# Patient Record
Sex: Female | Born: 1990 | Race: Black or African American | Hispanic: No | Smoking: Never smoker
Health system: Southern US, Community
[De-identification: ages and names within clinical notes are randomized; demographics above are authoritative.]

## PROBLEM LIST (undated history)

## (undated) ENCOUNTER — Inpatient Hospital Stay (HOSPITAL_COMMUNITY): Payer: Self-pay

## (undated) DIAGNOSIS — G43909 Migraine, unspecified, not intractable, without status migrainosus: Secondary | ICD-10-CM

---
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
## 2009-04-12 ENCOUNTER — Other Ambulatory Visit: Admission: RE | Admit: 2009-04-12 | Discharge: 2009-04-12 | Payer: Self-pay | Admitting: Family Medicine

## 2009-09-11 ENCOUNTER — Ambulatory Visit: Payer: Self-pay | Admitting: Family

## 2009-09-11 ENCOUNTER — Inpatient Hospital Stay (HOSPITAL_COMMUNITY): Admission: AD | Admit: 2009-09-11 | Discharge: 2009-09-11 | Payer: Self-pay | Admitting: Obstetrics & Gynecology

## 2009-09-25 ENCOUNTER — Emergency Department (HOSPITAL_COMMUNITY): Admission: EM | Admit: 2009-09-25 | Discharge: 2009-09-25 | Payer: Self-pay | Admitting: Emergency Medicine

## 2010-04-13 ENCOUNTER — Inpatient Hospital Stay (HOSPITAL_COMMUNITY)
Admission: AD | Admit: 2010-04-13 | Discharge: 2010-04-14 | Disposition: A | Discharge status: Home / Self Care | Payer: Medicaid Other | Source: Ambulatory Visit | Attending: Obstetrics and Gynecology | Admitting: Obstetrics and Gynecology

## 2010-04-13 DIAGNOSIS — R109 Unspecified abdominal pain: Secondary | ICD-10-CM

## 2010-04-13 DIAGNOSIS — O9989 Other specified diseases and conditions complicating pregnancy, childbirth and the puerperium: Secondary | ICD-10-CM

## 2010-04-13 DIAGNOSIS — O99891 Other specified diseases and conditions complicating pregnancy: Secondary | ICD-10-CM | POA: Insufficient documentation

## 2010-04-14 LAB — URINE MICROSCOPIC-ADD ON

## 2010-04-14 LAB — URINALYSIS, ROUTINE W REFLEX MICROSCOPIC
Bilirubin Urine: NEGATIVE
Glucose, UA: NEGATIVE mg/dL
Ketones, ur: 15 mg/dL — AB
Nitrite: POSITIVE — AB
Nitrite: POSITIVE — AB
Protein, ur: NEGATIVE mg/dL
Specific Gravity, Urine: 1.015 (ref 1.005–1.030)
Specific Gravity, Urine: 1.025 (ref 1.005–1.030)
Urobilinogen, UA: 4 mg/dL — ABNORMAL HIGH (ref 0.0–1.0)
pH: 6.5 (ref 5.0–8.0)

## 2010-04-14 LAB — POCT PREGNANCY, URINE: Preg Test, Ur: POSITIVE

## 2010-05-03 ENCOUNTER — Inpatient Hospital Stay (HOSPITAL_COMMUNITY)
Admission: AD | Admit: 2010-05-03 | Discharge: 2010-05-06 | DRG: 775 | Disposition: A | Discharge status: Home / Self Care | Payer: Medicaid Other | Source: Ambulatory Visit | Attending: Obstetrics and Gynecology | Admitting: Obstetrics and Gynecology

## 2010-05-03 DIAGNOSIS — O4290 Premature rupture of membranes, unspecified as to length of time between rupture and onset of labor, unspecified weeks of gestation: Secondary | ICD-10-CM | POA: Diagnosis present

## 2010-05-03 DIAGNOSIS — Z22330 Carrier of Group B streptococcus: Secondary | ICD-10-CM

## 2010-05-03 DIAGNOSIS — O34219 Maternal care for unspecified type scar from previous cesarean delivery: Principal | ICD-10-CM | POA: Diagnosis present

## 2010-05-03 DIAGNOSIS — O99892 Other specified diseases and conditions complicating childbirth: Secondary | ICD-10-CM | POA: Diagnosis present

## 2010-05-03 LAB — CBC
HCT: 29.1 % — ABNORMAL LOW (ref 36.0–46.0)
Hemoglobin: 10 g/dL — ABNORMAL LOW (ref 12.0–15.0)
MCHC: 34.4 g/dL (ref 30.0–36.0)
RDW: 14.6 % (ref 11.5–15.5)
WBC: 6.6 10*3/uL (ref 4.0–10.5)

## 2010-05-03 LAB — RPR: RPR Ser Ql: NONREACTIVE

## 2010-05-05 LAB — CBC
HCT: 22.3 % — ABNORMAL LOW (ref 36.0–46.0)
Platelets: 140 10*3/uL — ABNORMAL LOW (ref 150–400)
RBC: 2.52 MIL/uL — ABNORMAL LOW (ref 3.87–5.11)
RDW: 15.2 % (ref 11.5–15.5)
WBC: 8 10*3/uL (ref 4.0–10.5)

## 2010-05-06 LAB — URINE MICROSCOPIC-ADD ON

## 2010-05-06 LAB — URINALYSIS, ROUTINE W REFLEX MICROSCOPIC
Protein, ur: NEGATIVE mg/dL
Specific Gravity, Urine: 1.02 (ref 1.005–1.030)
Urobilinogen, UA: 1 mg/dL (ref 0.0–1.0)

## 2010-05-25 NOTE — Discharge Summary (Signed)
  NAMEVERENICE, WESTRICH         ACCOUNT NO.:  [id number]  MEDICAL RECORD NO.:  [id number]           PATIENT TYPE:  I  LOCATION:  9148                          FACILITY:  [facility]  PHYSICIAN:  Leighton Roach Dazha Kempa, M.D.DATE OF BIRTH:  Mar 27, 1990  DATE OF ADMISSION:  05/03/2010 DATE OF DISCHARGE:  05/06/2010                              DISCHARGE SUMMARY   ADMISSION DIAGNOSES:  Intrauterine pregnancy at 38 weeks, previous cesarean section, premature rupture of membranes, group B strep carrier.  DISCHARGE DIAGNOSIS:  Intrauterine pregnancy at 38 weeks, previous cesarean section, premature rupture of membranes, group B strep carrier.  PROCEDURES:  On April 5, she had a spontaneous vaginal delivery.  HISTORY AND PHYSICAL:  This is a 20 year old gravida 2, para 1-0-0-1 with an EGA of [redacted] weeks admitted by Dr. Ambrose Mantle with the complaint of leaking fluid.  Prenatal care complicated by UTI treated with Septra and the fact that she has a prior cesarean section is cleared for a trial labor and wishes to have a VBAC.  PRENATAL LABS:  Blood type is B+ with a negative antibody screen, rubella immune, first trimester screen normal.  MSAFP normal.  One hour Glucola 123, group B strep is positive, sensitive to erythromycin.  PAST OB HISTORY:  In 2008 low transverse cesarean section at term for prolonged second stage and failed vacuum, baby weight 6 pounds 6 ounces.  ALLERGIES:  PENICILLIN which causes hives.  PHYSICAL EXAMINATION:  VITAL SIGNS:  She is afebrile with stable vital signs. ABDOMEN:  Gravid, nontender.  Cervix is 3-4, 70-80, vertex -2.  Fetal heart tracing is normal.  HOSPITAL COURSE:  The patient was admitted by Dr. Ambrose Mantle and put on IV erythromycin and IV Pitocin.  She gradually progressed in active labor, reached complete, pushed well and early on the morning of April 5 had a vaginal delivery of a viable female infant with Apgar's of 9 and 9, weight 6 pounds 10 ounces.  Uterus  palpated normal.  She had bilateral labial laceration repaired with 3-0 Vicryl with local block and estimated blood loss was 400 mL.  Postpartum she had no significant complications.  Pre delivery hemoglobin was 10, postdelivery was 7.3. It was stable at 7.5 on postpartum day #2.  She had no clinical significant bleeding and was asymptomatic.  On postpartum day #2, she was felt to be stable enough for discharge home.  DISCHARGE INSTRUCTIONS:  Regular diet, pelvic rest, follow up in 6 weeks.  MEDICATIONS:  Over-the-counter ibuprofen as needed and over-the-counter iron b.i.d. and she is given our discharge pamphlet.     Zenaida Niece, M.D.     TDM/MEDQ  D:  05/06/2010  T:  05/06/2010  Job:  [id number]  Electronically Signed by Lavina Hamman M.D. on 05/25/2010 01:14:58 PM

## 2010-06-13 NOTE — Discharge Summary (Signed)
[name], FITZHENRY         ACCOUNT NO.:  [id number]   MEDICAL RECORD NO.:  [id number]          PATIENT TYPE:  INP   LOCATION:  9121                          FACILITY:  [facility]   PHYSICIAN:  Zenaida Niece, M.D.DATE OF BIRTH:  1990-08-28   DATE OF ADMISSION:  09/10/2006  DATE OF DISCHARGE:  09/14/2006                               DISCHARGE SUMMARY   ADMISSION DIAGNOSIS:  Intrauterine pregnancy at 39 weeks.   DISCHARGE DIAGNOSIS:  Intrauterine pregnancy at 39 weeks and prolonged  second stage of labor.   PROCEDURES:  On 08/13 Dr. Ambrose Mantle had a failed vacuum delivery and then  performed low-transverse cesarean section.   HISTORY AND PHYSICAL AND HOSPITAL COURSE:  Please see chart for full  history and physical as Dr. Ambrose Mantle dictated this. Briefly, this is a 20-  year-old black female gravida 1, para 0 with an EGA of [redacted] weeks by an  LMP consistent with an ultrasound who presents with complaint of rupture  of membranes.  Ruptured membranes were confirmed.  She was admitted to  labor and delivery and started on Pitocin.  She progressed to complete  and pushed and brought the baby down with a prolonged second stage.  Dr.  Ambrose Mantle took her to the operating room and attempted a vacuum delivery  but this was unsuccessful.  He then did a primary cesarean section under  epidural anesthesia with an estimated blood loss 1000 mL.  She delivered  a viable female infant with Apgars of 8 and 9, weight 6 pounds 6 ounces.  Postoperatively she has had no significant complications.  Pre delivery  hemoglobin 9.7, postdelivery 7.4.  On postoperative #3 she was felt to  be stable enough for discharge home.  At that time her incision is  healing well and her staples were removed and Steri-Strips applied.  Her  blood pressure on the morning of discharge is 140/90.  This is to be  rechecked prior to discharge and if it is elevated we will check [facility]  labs.      Zenaida Niece, M.D.  Electronically Signed     TDM/MEDQ  D:  09/14/2006  T:  09/15/2006  Job:  [id number]

## 2010-06-13 NOTE — Op Note (Signed)
NAMEZENORA, KARPEL         ACCOUNT NO.:  [id number]   MEDICAL RECORD NO.:  [id number]          PATIENT TYPE:  INP   LOCATION:  9121                          [facility]ACILITY:  [facility]   PHYSICIAN:  Malachi Pro. Ambrose Mantle, M.D. DATE OF BIRTH:  09/27/1990   DATE OF PROCEDURE:  09/11/2006  DATE OF DISCHARGE:                               OPERATIVE REPORT   PREOPERATIVE DIAGNOSES:  1. Intrauterine pregnancy at 39+ weeks.  2. Persistent occiput posterior.  3. Prolonged rupture of membranes.  4. Prolonged second stage of labor.   POSTOPERATIVE DIAGNOSES:  1. Intrauterine pregnancy at 39+ weeks.  2. Persistent occiput posterior.  3. Prolonged rupture of membranes.  4. Prolonged second stage of labor.  5. Failed vacuum delivery.  6. Fetal bradycardia that was marked.   OPERATION:  1. Attempted vacuum extraction.  2. Low transverse cervical cesarean section.   OPERATOR:  Malachi Pro. Ambrose Mantle, M.D.   Epidural anesthesia.   A female infant, 6 pounds 6 ounces, with Apgars of 8 and 9 at one and  five minutes.  Apgars assigned by Dr. Rennis Golden.   The patient was brought to the operating room and placed under  satisfactory epidural anesthesia, which had been boosted in the  patient's room.  When the patient got to the operating room and was  placed in stirrups, it was apparent that the epidural boost had caused  the vertex to lose station.  I attempted to apply the Kiwi vacuum after  sterilely draping and it is possible that I got a little bit of vaginal  soft tissue because I got a pop-off.  I reapplied the vacuum and it  popped off again.  There had been almost no descent, so I proceeded to  prepare her for C-section.  The abdomen was prepped with Betadine  solution.  The Foley catheter had been removed so I did not replace it.  The fetal heart rate had plummeted and did not show signs of recovery so  I wanted to proceed hastily to the C-section.  I draped the area as a  sterile field,  confirmed anesthesia, and then made a transverse incision  through the skin, subcutaneous tissue and fascia.  The fascia was  separated from the rectus muscles superiorly and inferiorly.  The rectus  muscle was split in the midline.  The peritoneum was opened vertically.  An incision was made into the lower uterine segment and went the rest of  the way into the amniotic sac with my finger and then pulled superiorly  and inferiorly on the incision to enlarge the incision.  The vertex was  posterior and lodged into the pelvis.  I asked the nurse to push up on  the vertex, which she did, and then I was able to deliver the baby's  head through the incisional opening.  The nose and pharynx were  suctioned with the bulb and it was apparent that the baby was making  respiratory effort.  The rest of the baby was delivered.  The cord was  clamped.  The infant was given to Dr. Rennis Golden, who was in  attendance.  He  assigned the  6 pound 6 ounce female infant Apgars of 8  at one and 9 at five minutes.  The placenta was removed intact.  The  inside of the uterus was inspected, found to be free of debris.  There  was a brisk bleeder from the left uterine incision.  The uterine  incision was closed in two layers using a running locked suture of 0  Vicryl on the first layer, a nonlocking suture of the same material on  the second layer.  The uterus, both tubes and ovaries appeared normal.  Liberal irrigation confirmed hemostasis.  A couple of extra sutures were  required for complete hemostasis.  The abdominal wall, having been  irrigated and cleansed of all debris, was then closed in layers using  interrupted sutures of 0 Vicryl in the rectus muscle, including the  peritoneum, two running sutures of 0 Vicryl on the fascia, a running 3-0  Vicryl on the subcu tissue, and staples on the skin.  The patient seemed  to tolerate the procedure well.  Blood loss was about 1000 mL.  Sponge  and needle counts  were correct, and she was returned to recovery in  satisfactory condition.      Malachi Pro. Ambrose Mantle, M.D.  Electronically Signed     TFH/MEDQ  D:  09/11/2006  T:  09/12/2006  Job:  [id number]

## 2010-06-13 NOTE — H&P (Signed)
NAMELAURENE, Victoria Solis         ACCOUNT NO.:  [id number]   MEDICAL RECORD NO.:  [id number]          PATIENT TYPE:  INP   LOCATION:  9121                          [facility]ACILITY:  [facility]   PHYSICIAN:  Malachi Pro. Ambrose Mantle, M.D. DATE OF BIRTH:  10-18-1990   DATE OF ADMISSION:  09/10/2006  DATE OF DISCHARGE:                              HISTORY & PHYSICAL   This is a 20 year old black female para 0, gravida 1, EDC September 15, 2006, by last period compatible with a 10-week ultrasound, who presented  to the office on September 10, 2006, with spontaneous rupture of membranes  at approximately 11 a.m.  She had no significant contractions.  She was  sent to maternity admission for admission and she had to wait for a bed.  Prenatal care was uneventful except for teen pregnancy, but she does  have support.  Blood group and type B+, negative antibody.  Rubella  immune.  RPR nonreactive.  Hepatitis B surface antigen negative.  Sickle  cell negative.  HIV negative.  GC negative.  Chlamydia was positive, was  negative on repeat after treatment.  One-hour Glucola 101.   PAST OBSTETRICAL HISTORY:  Negative.   No GYN history, medical history or surgical history.   She was allergic to PENICILLIN, caused hives.   MEDICATIONS:  Prenatal vitamins.   On admission she was afebrile with normal vital signs.  HEART/LUNGS:  Normal.  ABDOMEN:  Soft and nontender.  Cervix was 2 cm, 50%, vertex at a -2 on September 10, 2006, at 5:45 p.m.   The patient finally arrived in labor and delivery at approximately 9:30  p.m.  Fetal heart tones were reactive.  She was not having contractions.  She was begun on Pitocin.  By 6:30 a.m. on September 11, 2006, she was  comfortable with an epidural.  Her cervix was felt to be 4 cm, 90%, by  Dr. Senaida Ores.  No evidence of preeclampsia.  She was given an  intrauterine pressure catheter by Dr. Senaida Ores and her [place]  units were adequate.  At 7:55 a.m. I felt the cervix was no more than  4  cm, 90%, vertex at a -2.  By 11:45 a.m. the Pitocin was at 8 milliunits  a minute, cervix was 9 cm, 100%, vertex at a 0 to +1 station.  The  patient became fully dilated at 12:20 p.m.  By 1:13 p.m. she had pushed  for 53 minutes and at that point the vertex was still high, leading part  of the caput was at least 1 inch inside the introitus.  Pitocin was at 8  milliunits a minute.  At 3:20 p.m. the patient had made slight progress.  The vertex was OP.  I could see a little bit of the caput but the vertex  was well above the introitus.  I elected to take the patient to the  operating room for attempted vaginal delivery with a vacuum and if that  was unsuccessful, then proceed with C-section.   IMPRESSION:  1. Intrauterine pregnancy at 39+ weeks.  2. Persistent occiput posterior.  3. Prolonged rupture of membranes.  4. Prolonged second stage  of labor.  5. Intermittent fetal bradycardia.   The patient is prepared to go to the operating room.      Malachi Pro. Ambrose Mantle, M.D.  Electronically Signed     TFH/MEDQ  D:  09/11/2006  T:  09/12/2006  Job:  [id number]

## 2010-10-25 LAB — CBC
HCT: 34.5 — ABNORMAL LOW
Hemoglobin: 11.7 — ABNORMAL LOW
MCV: 88.3
RBC: 3.91
WBC: 5.4

## 2010-11-09 LAB — URINALYSIS, ROUTINE W REFLEX MICROSCOPIC
Glucose, UA: NEGATIVE
Protein, ur: NEGATIVE
Specific Gravity, Urine: 1.013
pH: 6.5

## 2010-11-09 LAB — URINE MICROSCOPIC-ADD ON

## 2010-11-13 LAB — COMPREHENSIVE METABOLIC PANEL
AST: 28
Albumin: 2.7 — ABNORMAL LOW
BUN: 8
Creatinine, Ser: 0.57
Total Protein: 5.4 — ABNORMAL LOW

## 2010-11-13 LAB — CBC
HCT: 22.2 — ABNORMAL LOW
Hemoglobin: 7.4 — CL
Hemoglobin: 9.7 — ABNORMAL LOW
MCHC: 33.9
MCV: 91.3
RBC: 2.43 — ABNORMAL LOW
RDW: 14.3 — ABNORMAL HIGH
WBC: 9.2

## 2010-11-13 LAB — URINALYSIS, DIPSTICK ONLY
Glucose, UA: NEGATIVE
Ketones, ur: NEGATIVE
Leukocytes, UA: NEGATIVE
Nitrite: NEGATIVE
Protein, ur: NEGATIVE
pH: 7

## 2010-11-13 LAB — URIC ACID: Uric Acid, Serum: 4.5

## 2012-04-14 ENCOUNTER — Emergency Department (HOSPITAL_COMMUNITY)
Admission: EM | Admit: 2012-04-14 | Discharge: 2012-04-14 | Disposition: A | Discharge status: Home / Self Care | Payer: Medicaid Other | Attending: Emergency Medicine | Admitting: Emergency Medicine

## 2012-04-14 ENCOUNTER — Encounter (HOSPITAL_COMMUNITY): Payer: Self-pay | Admitting: *Deleted

## 2012-04-14 DIAGNOSIS — K5289 Other specified noninfective gastroenteritis and colitis: Secondary | ICD-10-CM | POA: Insufficient documentation

## 2012-04-14 DIAGNOSIS — Z3202 Encounter for pregnancy test, result negative: Secondary | ICD-10-CM | POA: Insufficient documentation

## 2012-04-14 DIAGNOSIS — R197 Diarrhea, unspecified: Secondary | ICD-10-CM | POA: Insufficient documentation

## 2012-04-14 DIAGNOSIS — Z8679 Personal history of other diseases of the circulatory system: Secondary | ICD-10-CM | POA: Insufficient documentation

## 2012-04-14 DIAGNOSIS — R51 Headache: Secondary | ICD-10-CM | POA: Insufficient documentation

## 2012-04-14 DIAGNOSIS — R109 Unspecified abdominal pain: Secondary | ICD-10-CM | POA: Insufficient documentation

## 2012-04-14 DIAGNOSIS — K529 Noninfective gastroenteritis and colitis, unspecified: Secondary | ICD-10-CM

## 2012-04-14 HISTORY — DX: Migraine, unspecified, not intractable, without status migrainosus: G43.909

## 2012-04-14 LAB — URINALYSIS, ROUTINE W REFLEX MICROSCOPIC
Bilirubin Urine: NEGATIVE
Glucose, UA: NEGATIVE mg/dL
Hgb urine dipstick: NEGATIVE
Specific Gravity, Urine: 1.023 (ref 1.005–1.030)
Urobilinogen, UA: 0.2 mg/dL (ref 0.0–1.0)

## 2012-04-14 LAB — CBC WITH DIFFERENTIAL/PLATELET
Basophils Relative: 0 % (ref 0–1)
Eosinophils Absolute: 0 10*3/uL (ref 0.0–0.7)
Eosinophils Relative: 0 % (ref 0–5)
HCT: 38.6 % (ref 36.0–46.0)
Hemoglobin: 13.8 g/dL (ref 12.0–15.0)
MCH: 31.2 pg (ref 26.0–34.0)
MCHC: 35.8 g/dL (ref 30.0–36.0)
MCV: 87.1 fL (ref 78.0–100.0)
Monocytes Absolute: 0.2 10*3/uL (ref 0.1–1.0)
Monocytes Relative: 3 % (ref 3–12)
Neutro Abs: 7 10*3/uL (ref 1.7–7.7)

## 2012-04-14 LAB — COMPREHENSIVE METABOLIC PANEL
ALT: 17 U/L (ref 0–35)
AST: 18 U/L (ref 0–37)
Alkaline Phosphatase: 32 U/L — ABNORMAL LOW (ref 39–117)
Chloride: 103 mEq/L (ref 96–112)
GFR calc Af Amer: >90 mL/min (ref >90)
Total Protein: 7.5 g/dL (ref 6.0–8.3)

## 2012-04-14 LAB — POCT PREGNANCY, URINE: Preg Test, Ur: NEGATIVE

## 2012-04-14 MED ORDER — SODIUM CHLORIDE 0.9 % IV BOLUS (SEPSIS)
1000.0000 mL | Freq: Once | INTRAVENOUS | Status: AC
  Administered 2012-04-14: 1000 mL via INTRAVENOUS

## 2012-04-14 NOTE — ED Notes (Signed)
Pt aware that we need urine specimen. Unable to go at this time 

## 2012-04-14 NOTE — ED Notes (Signed)
Pt states headache since 0500 with history of headaches.  Pt states started having diarrhea and vomiting and has dizziness with standing.

## 2012-04-14 NOTE — ED Provider Notes (Signed)
History     CSN: [id number]  Arrival date & time 04/14/12  1232   First MD Initiated Contact with Patient 04/14/12 1520      Chief Complaint  Patient presents with  . Emesis  . Diarrhea  . Headache    (Consider location/radiation/quality/duration/timing/severity/associated sxs/prior treatment) Patient is a 22 y.o. female presenting with vomiting, diarrhea, and headaches. The history is provided by the patient. No language interpreter was used.  Emesis Associated symptoms: abdominal pain, diarrhea and headaches   Diarrhea:    Quality:  Watery   Number of occurrences:  2   Severity:  Moderate   Duration:  12 hours   Timing:  Intermittent   Progression:  Partially resolved Diarrhea Associated symptoms: abdominal pain and headaches   Associated symptoms: no vomiting   Headache Associated symptoms: abdominal pain, diarrhea and nausea   Associated symptoms: no vomiting    Patient also reports nausea, but no emesis.  Cramp-like abdominal pain.  Sibling with vomiting and diarrhea at home.  History of migraine type headaches, frequently relieved with aleve.  Past Medical History  Diagnosis Date  . Migraine     History reviewed. No pertinent past surgical history.  No family history on file.  History  Substance Use Topics  . Smoking status: Never Smoker   . Smokeless tobacco: Not on file  . Alcohol Use: No    OB History   Grav Para Term Preterm Abortions TAB SAB Ect Mult Living                  Review of Systems  Gastrointestinal: Positive for nausea, abdominal pain and diarrhea. Negative for vomiting.  Neurological: Positive for headaches.  All other systems reviewed and are negative.    Allergies  Amoxicillin and Penicillins  Home Medications  No current outpatient prescriptions on file.  BP 110/54  Pulse 62  Temp(Src) 98 F (36.7 C) (Oral)  Resp 18  SpO2 100%  Physical Exam  Constitutional: She is oriented to person, place, and time. She appears  well-developed and well-nourished.  HENT:  Head: Normocephalic.  Eyes: Pupils are equal, round, and reactive to light.  Neck: Normal range of motion.  Cardiovascular: Normal rate and regular rhythm.   Pulmonary/Chest: Effort normal and breath sounds normal.  Abdominal: Soft. Bowel sounds are normal.  Musculoskeletal: She exhibits no edema and no tenderness.  Lymphadenopathy:    She has no cervical adenopathy.  Neurological: She is alert and oriented to person, place, and time.  Skin: Skin is warm and dry. No rash noted.  Psychiatric: She has a normal mood and affect. Her behavior is normal. Judgment and thought content normal.    ED Course  Procedures (including critical care time)  Labs Reviewed  CBC WITH DIFFERENTIAL - Abnormal; Notable for the following:    Neutrophils Relative 90 (*)    Lymphocytes Relative 7 (*)    Lymphs Abs 0.6 (*)    All other components within normal limits  COMPREHENSIVE METABOLIC PANEL - Abnormal; Notable for the following:    Alkaline Phosphatase 32 (*)    All other components within normal limits  LIPASE, BLOOD  URINALYSIS, ROUTINE W REFLEX MICROSCOPIC  POCT PREGNANCY, URINE   No results found.   No diagnosis found.  Patient feeling better after IV fluids.  Nausea and headache improved. No additional episodes of diarrhea.  Gastroenteritis with mild dehydration.  MDM          Jimmye Norman, NP 04/14/12 1949

## 2012-04-21 NOTE — ED Provider Notes (Signed)
Medical screening examination/treatment/procedure(s) were performed by non-physician practitioner and as supervising physician I was immediately available for consultation/collaboration.  Suhas Estis T Dafna Romo, MD 04/21/12 1141 

## 2012-05-24 ENCOUNTER — Emergency Department (HOSPITAL_COMMUNITY)
Admission: EM | Admit: 2012-05-24 | Discharge: 2012-05-24 | Disposition: A | Discharge status: Home / Self Care | Payer: Medicaid Other | Attending: Emergency Medicine | Admitting: Emergency Medicine

## 2012-05-24 ENCOUNTER — Encounter (HOSPITAL_COMMUNITY): Payer: Self-pay | Admitting: *Deleted

## 2012-05-24 ENCOUNTER — Emergency Department (HOSPITAL_COMMUNITY): Payer: Medicaid Other

## 2012-05-24 DIAGNOSIS — W268XXA Contact with other sharp object(s), not elsewhere classified, initial encounter: Secondary | ICD-10-CM | POA: Insufficient documentation

## 2012-05-24 DIAGNOSIS — Y9289 Other specified places as the place of occurrence of the external cause: Secondary | ICD-10-CM | POA: Insufficient documentation

## 2012-05-24 DIAGNOSIS — Y9389 Activity, other specified: Secondary | ICD-10-CM | POA: Insufficient documentation

## 2012-05-24 DIAGNOSIS — Z23 Encounter for immunization: Secondary | ICD-10-CM | POA: Insufficient documentation

## 2012-05-24 DIAGNOSIS — Z8679 Personal history of other diseases of the circulatory system: Secondary | ICD-10-CM | POA: Insufficient documentation

## 2012-05-24 DIAGNOSIS — S91331A Puncture wound without foreign body, right foot, initial encounter: Secondary | ICD-10-CM

## 2012-05-24 DIAGNOSIS — Z880 Allergy status to penicillin: Secondary | ICD-10-CM | POA: Insufficient documentation

## 2012-05-24 DIAGNOSIS — S91309A Unspecified open wound, unspecified foot, initial encounter: Secondary | ICD-10-CM | POA: Insufficient documentation

## 2012-05-24 MED ORDER — TETANUS-DIPHTH-ACELL PERTUSSIS 5-2.5-18.5 LF-MCG/0.5 IM SUSP
0.5000 mL | Freq: Once | INTRAMUSCULAR | Status: AC
  Administered 2012-05-24: 0.5 mL via INTRAMUSCULAR
  Filled 2012-05-24: qty 0.5

## 2012-05-24 MED ORDER — CLINDAMYCIN HCL 300 MG PO CAPS: 300.0000 mg | ORAL_CAPSULE | Freq: Four times a day (QID) | ORAL | Status: DC

## 2012-05-24 MED ORDER — HYDROCODONE-ACETAMINOPHEN 5-325 MG PO TABS
1.0000 | ORAL_TABLET | Freq: Once | ORAL | Status: AC
  Administered 2012-05-24: 1 via ORAL
  Filled 2012-05-24: qty 1

## 2012-05-24 MED ORDER — HYDROCODONE-ACETAMINOPHEN 5-325 MG PO TABS: 1.0000 | ORAL_TABLET | Freq: Four times a day (QID) | ORAL | Status: DC | PRN

## 2012-05-24 NOTE — ED Provider Notes (Signed)
History     CSN: [id number]  Arrival date & time 05/24/12  4098   First MD Initiated Contact with Patient 05/24/12 2014      Chief Complaint  Patient presents with  . Puncture Wound   HPI  History provided by the patient. Patient is a 22 year old female with no significant PMH who presents with complaints of puncture wound after stepping on a nail to the right foot. Injury happened prior to arrival. Patient was wearing open sandals and taking out the trash when she stepped on a small rusty nail. Patient believes the nail remained intact but she did report point L. small pieces of dirt around the area. She complains of pain to the area and bottom of the foot. We'll symptoms are worse with pressure and walking. She has not used any medications or treatment for her symptoms. She denies any weakness or numbness in the toes. Denies any other associated symptoms. Patient is unsure of her last tetanus shot.   Past Medical History  Diagnosis Date  . Migraine     No past surgical history on file.  No family history on file.  History  Substance Use Topics  . Smoking status: Never Smoker   . Smokeless tobacco: Not on file  . Alcohol Use: No    OB History   Grav Para Term Preterm Abortions TAB SAB Ect Mult Living                  Review of Systems  Neurological: Negative for weakness and numbness.  All other systems reviewed and are negative.    Allergies  Amoxicillin and Penicillins  Home Medications   Current Outpatient Rx  Name  Route  Sig  Dispense  Refill  . MedroxyPROGESTERone Acetate (DEPO-PROVERA IM)   Intramuscular   Inject into the muscle every 3 (three) months.           BP 109/57  Pulse 62  Temp(Src) 98.2 F (36.8 C) (Oral)  Resp 16  SpO2 98%  Physical Exam  Nursing note and vitals reviewed. Constitutional: She is oriented to person, place, and time. She appears well-developed and well-nourished. No distress.  HENT:  Head: Normocephalic.   Cardiovascular: Normal rate and regular rhythm.   Pulmonary/Chest: Effort normal and breath sounds normal. No respiratory distress. She has no wheezes. She has no rales.  Musculoskeletal: Normal range of motion. She exhibits tenderness.  Small 2 mm puncture wound to the dorsal pad of the right foot in the area of the first and second metatarsals. Mild to moderate tenderness to palpation to this area. No active bleeding. No foreign bodies seen. Normal sensation to all toes. Normal range of motion. Normal cap refill less than 2 seconds.  Neurological: She is alert and oriented to person, place, and time.  Skin: Skin is warm and dry. No rash noted.  Psychiatric: She has a normal mood and affect. Her behavior is normal.    ED Course  Procedures      Dg Foot Complete Right  05/24/2012  *RADIOLOGY REPORT*  Clinical Data: Right foot pain after stepped on nail.  Puncture wound between the first and second metatarsal region.  RIGHT FOOT COMPLETE - 3+ VIEW  Comparison: None.  Findings: No radiopaque soft tissue foreign bodies or gas collections are identified.  Bones appear intact.  No evidence of acute fracture or subluxation.  No focal bone lesion or bone destruction.  No abnormal periosteal reaction.  IMPRESSION: No radiopaque soft tissue foreign bodies.  No acute bony abnormalities.   Original Report Authenticated By: Burman Nieves, M.D.      1. Puncture wound to foot, right, initial encounter       MDM  8:25 PM patient seen and evaluated. Patient resting comfortably appears well no acute distress.  Tetanus given.      Angus Seller, PA-C 05/24/12 2215

## 2012-05-24 NOTE — ED Notes (Signed)
Walking to take trash out, step on nail - right foot. Took nail out. Site clean, dry intact. Old blood (scant aroun site)

## 2012-05-25 NOTE — ED Provider Notes (Signed)
Medical screening examination/treatment/procedure(s) were performed by non-physician practitioner and as supervising physician I was immediately available for consultation/collaboration.  Gilda Crease, MD 05/25/12 [phone]

## 2013-01-02 ENCOUNTER — Emergency Department (INDEPENDENT_AMBULATORY_CARE_PROVIDER_SITE_OTHER)
Admission: EM | Admit: 2013-01-02 | Discharge: 2013-01-02 | Disposition: A | Discharge status: Home / Self Care | Payer: Medicaid Other | Source: Home / Self Care | Attending: Family Medicine | Admitting: Family Medicine

## 2013-01-02 ENCOUNTER — Encounter (HOSPITAL_COMMUNITY): Payer: Self-pay | Admitting: Emergency Medicine

## 2013-01-02 DIAGNOSIS — H6123 Impacted cerumen, bilateral: Secondary | ICD-10-CM

## 2013-01-02 DIAGNOSIS — H6120 Impacted cerumen, unspecified ear: Secondary | ICD-10-CM

## 2013-01-02 NOTE — ED Provider Notes (Signed)
CSN: [id number]     Arrival date & time 01/02/13  1416 History   First MD Initiated Contact with Patient 01/02/13 1459     Chief Complaint  Patient presents with  . Otalgia   (Consider location/radiation/quality/duration/timing/severity/associated sxs/prior Treatment) Patient is a 22 y.o. female presenting with ear pain. The history is provided by the patient.  Otalgia Location:  Bilateral Behind ear:  No abnormality Quality:  Dull Severity:  Mild Onset quality:  Gradual Duration:  1 week Progression:  Unchanged Chronicity:  New Context comment:  Using q-tips Relieved by:  None tried Worsened by:  Nothing tried Ineffective treatments:  None tried Associated symptoms: no ear discharge, no fever, no rhinorrhea and no tinnitus     Past Medical History  Diagnosis Date  . Migraine    History reviewed. No pertinent past surgical history. No family history on file. History  Substance Use Topics  . Smoking status: Never Smoker   . Smokeless tobacco: Not on file  . Alcohol Use: No   OB History   Grav Para Term Preterm Abortions TAB SAB Ect Mult Living                 Review of Systems  Constitutional: Negative.  Negative for fever.  HENT: Positive for ear pain. Negative for ear discharge, facial swelling, rhinorrhea and tinnitus.     Allergies  Amoxicillin and Penicillins  Home Medications   Current Outpatient Rx  Name  Route  Sig  Dispense  Refill  . clindamycin (CLEOCIN) 300 MG capsule   Oral   Take 1 capsule (300 mg total) by mouth 4 (four) times daily. X 7 days   28 capsule   0   . HYDROcodone-acetaminophen (NORCO) 5-325 MG per tablet   Oral   Take 1 tablet by mouth every 6 (six) hours as needed for pain.   20 tablet   0   . MedroxyPROGESTERone Acetate (DEPO-PROVERA IM)   Intramuscular   Inject into the muscle every 3 (three) months.          BP 106/67  Pulse 62  Temp(Src) 99.2 F (37.3 C) (Oral)  Resp 16  SpO2 100% Physical Exam  Nursing  note and vitals reviewed. Constitutional: She appears well-developed and well-nourished.  HENT:  Head: Normocephalic.  Ears:  Mouth/Throat: Oropharynx is clear and moist.    ED Course  Procedures (including critical care time) Labs Review Labs Reviewed - No data to display Imaging Review No results found.  EKG Interpretation    Date/Time:    Ventricular Rate:    PR Interval:    QRS Duration:   QT Interval:    QTC Calculation:   R Axis:     Text Interpretation:              MDM  Sx resolved with ear irrigation, canals and tm wnl.    Linna Hoff, MD 01/02/13 [phone]

## 2013-01-02 NOTE — ED Notes (Signed)
Right ear pain for one week.  Hearing muffled x 1 1/2 wk.

## 2013-08-04 ENCOUNTER — Encounter (HOSPITAL_COMMUNITY): Payer: Self-pay | Admitting: Emergency Medicine

## 2013-08-04 ENCOUNTER — Emergency Department (HOSPITAL_COMMUNITY)
Admission: EM | Admit: 2013-08-04 | Discharge: 2013-08-04 | Discharge status: Against Medical Advice | Payer: Medicaid Other | Attending: Emergency Medicine | Admitting: Emergency Medicine

## 2013-08-04 DIAGNOSIS — R109 Unspecified abdominal pain: Secondary | ICD-10-CM | POA: Insufficient documentation

## 2013-08-04 LAB — COMPREHENSIVE METABOLIC PANEL
ALBUMIN: 4.4 g/dL (ref 3.5–5.2)
ALK PHOS: 32 U/L — AB (ref 39–117)
ALT: 14 U/L (ref 0–35)
ANION GAP: 13 (ref 5–15)
AST: 14 U/L (ref 0–37)
BUN: 13 mg/dL (ref 6–23)
CO2: 24 mEq/L (ref 19–32)
Calcium: 9.2 mg/dL (ref 8.4–10.5)
Chloride: 105 mEq/L (ref 96–112)
Creatinine, Ser: 0.88 mg/dL (ref 0.50–1.10)
GFR calc Af Amer: >90 mL/min (ref >90)
GFR calc non Af Amer: >90 mL/min (ref >90)
Glucose, Bld: 107 mg/dL — ABNORMAL HIGH (ref 70–99)
POTASSIUM: 3.8 meq/L (ref 3.7–5.3)
SODIUM: 142 meq/L (ref 137–147)
TOTAL PROTEIN: 7.2 g/dL (ref 6.0–8.3)
Total Bilirubin: 0.7 mg/dL (ref 0.3–1.2)

## 2013-08-04 LAB — CBC WITH DIFFERENTIAL/PLATELET
BASOS ABS: 0 10*3/uL (ref 0.0–0.1)
BASOS PCT: 1 % (ref 0–1)
EOS ABS: 0 10*3/uL (ref 0.0–0.7)
Eosinophils Relative: 1 % (ref 0–5)
HCT: 38.2 % (ref 36.0–46.0)
Hemoglobin: 13.2 g/dL (ref 12.0–15.0)
Lymphocytes Relative: 32 % (ref 12–46)
Lymphs Abs: 1.8 10*3/uL (ref 0.7–4.0)
MCH: 31 pg (ref 26.0–34.0)
MCHC: 34.6 g/dL (ref 30.0–36.0)
MCV: 89.7 fL (ref 78.0–100.0)
Monocytes Absolute: 0.3 10*3/uL (ref 0.1–1.0)
Monocytes Relative: 5 % (ref 3–12)
NEUTROS PCT: 61 % (ref 43–77)
Neutro Abs: 3.4 10*3/uL (ref 1.7–7.7)
PLATELETS: 185 10*3/uL (ref 150–400)
RBC: 4.26 MIL/uL (ref 3.87–5.11)
RDW: 12.4 % (ref 11.5–15.5)
WBC: 5.6 10*3/uL (ref 4.0–10.5)

## 2013-08-04 LAB — LIPASE, BLOOD: Lipase: 41 U/L (ref 11–59)

## 2013-08-04 NOTE — ED Notes (Signed)
Pt presents to department for evaluation of diffuse abdominal pain, ongoing x2 weeks. 8/10 pain at the time. Denies urinary symptoms. Denies vaginal symptoms. Pt is alert and oriented x4.

## 2013-09-17 ENCOUNTER — Emergency Department (HOSPITAL_COMMUNITY)
Admission: EM | Admit: 2013-09-17 | Discharge: 2013-09-17 | Disposition: A | Discharge status: Home / Self Care | Payer: Self-pay | Source: Home / Self Care

## 2013-11-29 ENCOUNTER — Emergency Department (INDEPENDENT_AMBULATORY_CARE_PROVIDER_SITE_OTHER)
Admission: EM | Admit: 2013-11-29 | Discharge: 2013-11-29 | Disposition: A | Discharge status: Home / Self Care | Payer: Medicaid Other | Source: Home / Self Care | Attending: Emergency Medicine | Admitting: Emergency Medicine

## 2013-11-29 ENCOUNTER — Encounter (HOSPITAL_COMMUNITY): Payer: Self-pay | Admitting: Emergency Medicine

## 2013-11-29 DIAGNOSIS — B354 Tinea corporis: Secondary | ICD-10-CM

## 2013-11-29 MED ORDER — CLOTRIMAZOLE-BETAMETHASONE 1-0.05 % EX CREA: TOPICAL_CREAM | CUTANEOUS | Status: DC

## 2013-11-29 NOTE — ED Provider Notes (Signed)
CSN: [id number]     Arrival date & time 11/29/13  1250 History   First MD Initiated Contact with Patient 11/29/13 1301     Chief Complaint  Patient presents with  . Rash   (Consider location/radiation/quality/duration/timing/severity/associated sxs/prior Treatment) HPI Comments: Patient developed small area of pruritic rash at underwear line of left hip 2-3 days ago. Boyfriend with same. Applied bleach to area without improvement.  Reports herself to be otherwise healthy. PCP: none   Patient is a 23 y.o. female presenting with rash. The history is provided by the patient.  Rash   Past Medical History  Diagnosis Date  . Migraine    History reviewed. No pertinent past surgical history. History reviewed. No pertinent family history. History  Substance Use Topics  . Smoking status: Never Smoker   . Smokeless tobacco: Not on file  . Alcohol Use: No   OB History    No data available     Review of Systems  Skin: Positive for rash.  All other systems reviewed and are negative.   Allergies  Amoxicillin and Penicillins  Home Medications   Prior to Admission medications   Medication Sig Start Date End Date Taking? Authorizing Provider  clotrimazole-betamethasone (LOTRISONE) cream Apply to affected area 2 times daily x 7 days 11/29/13   Ria ClockJennifer Lee H Lamin Chandley, PA  MedroxyPROGESTERone Acetate (DEPO-PROVERA IM) Inject into the muscle every 3 (three) months.    Historical Provider, MD   BP 113/63 mmHg  Pulse 53  Temp(Src) 98 F (36.7 C) (Oral)  Resp 16  SpO2 100% Physical Exam  Constitutional: She is oriented to person, place, and time. She appears well-developed and well-nourished. No distress.  HENT:  Head: Normocephalic and atraumatic.  Cardiovascular: Normal rate, regular rhythm and normal heart sounds.   Pulmonary/Chest: Effort normal and breath sounds normal.  Musculoskeletal: Normal range of motion.  Neurological: She is alert and oriented to person, place, and  time.  Skin: Skin is warm and dry. Rash noted. There is erythema.     Psychiatric: She has a normal mood and affect. Her behavior is normal.  Nursing note and vitals reviewed.   ED Course  Procedures (including critical care time) Labs Review Labs Reviewed - No data to display  Imaging Review No results found.   MDM   1. Tinea corporis   Lotrisone as directed with follow up if no improvement.     Ria ClockJennifer Lee H Tymeer Vaquera, [place]PA 11/29/13 [phone]

## 2013-11-29 NOTE — Discharge Instructions (Signed)

## 2013-11-29 NOTE — ED Notes (Signed)
Pt states that she had a rash that started Tuesday 11/24/2013. Pt states that it was itching and irritated and pt placed clorox bleach on it. Rash is now red and scabbed. Pt in no acute distress at this time.

## 2014-07-03 ENCOUNTER — Emergency Department (INDEPENDENT_AMBULATORY_CARE_PROVIDER_SITE_OTHER)
Admission: EM | Admit: 2014-07-03 | Discharge: 2014-07-03 | Disposition: A | Discharge status: Home / Self Care | Payer: Self-pay | Source: Home / Self Care | Attending: Family Medicine | Admitting: Family Medicine

## 2014-07-03 ENCOUNTER — Encounter (HOSPITAL_COMMUNITY): Payer: Self-pay

## 2014-07-03 DIAGNOSIS — W57XXXA Bitten or stung by nonvenomous insect and other nonvenomous arthropods, initial encounter: Secondary | ICD-10-CM

## 2014-07-03 DIAGNOSIS — T148 Other injury of unspecified body region: Secondary | ICD-10-CM

## 2014-07-03 MED ORDER — TRIAMCINOLONE ACETONIDE 0.1 % EX CREA: 1.0000 "application " | TOPICAL_CREAM | Freq: Two times a day (BID) | CUTANEOUS | Status: DC

## 2014-07-03 NOTE — Discharge Instructions (Signed)
Bedbugs °Bedbugs are tiny bugs that live in and around beds. During the day, they hide in mattresses and other places near beds. They come out at night and bite people lying in bed. They need blood to live and grow. Bedbugs can be found in beds anywhere. Usually, they are found in places where many people come and go (hotels, shelters, hospitals). It does not matter whether the place is dirty or clean. °Getting bitten by bedbugs rarely causes a medical problem. The biggest problem can be getting rid of them.  This often takes the work of a pest control expert. °CAUSES °· Less use of pesticides. Bedbugs were common before the 1950s. Then, strong pesticides such as DDT nearly wiped them out. Today, these pesticides are not used because they harm the environment and can cause health problems. °· More travel. Besides mattresses, bedbugs can also live in clothing and luggage. They can come along as people travel from place to place. Bedbugs are more common in certain parts of the world. When people travel to those areas, the bugs can come home with them. °· Presence of birds and bats. Bedbugs often infest birds and bats. If you have these animals in or near your home, bedbugs may infest your house, too. °SYMPTOMS °It does not hurt to be bitten by a bedbug. You will probably not wake up when you are bitten. Bedbugs usually bite areas of the skin that are not covered. Symptoms may show when you wake up, or they may take a day or more to show up. Symptoms may include: °· Small red bumps on the skin. These might be lined up in a row or clustered in a group. °· A darker red dot in the middle of red bumps. °· Blisters on the skin. There may be swelling and very bad itching. These may be signs of an allergic reaction. This does not happen often. °DIAGNOSIS °Bedbug bites might look and feel like other types of insect bites. The bugs do not stay on the body like ticks or lice. They bite, drop off, and crawl away to hide. Your  caregiver will probably: °· Ask about your symptoms. °· Ask about your recent activities and travel. °· Check your skin for bedbug bites. °· Ask you to check at home for signs of bedbugs. You should look for: °¨ Spots or stains on the bed or nearby. This could be from bedbugs that were crushed or from their eggs or waste. °¨ Bedbugs themselves. They are reddish-brown, oval, and flat. They do not fly. They are about the size of an apple seed. °· Places to look for bedbugs include: °¨ Beds. Check mattresses, headboards, box springs, and bed frames. °¨ On drapes and curtains near the bed. °¨ Under carpeting in the bedroom. °¨ Behind electrical outlets. °¨ Behind any wallpaper that is peeling. °¨ Inside luggage. °TREATMENT °Most bedbug bites do not need treatment. They usually go away on their own in a few days. The bites are not dangerous. However, treatment may be needed if you have scratched so much that your skin has become infected. You may also need treatment if you are allergic to bedbug bites. Treatment options include: °· A drug that stops swelling and itching (corticosteroid). Usually, a cream is rubbed on the skin. If you have a bad rash, you may be given a corticosteroid pill. °· Oral antihistamines. These are pills to help control itching. °· Antibiotic medicines. An antibiotic may be prescribed for infected skin. °HOME CARE INSTRUCTIONS  °·   Take any medicine prescribed by your caregiver for your bites. Follow the directions carefully. °· Consider wearing pajamas with long sleeves and pant legs. °· Your bedroom may need to be treated. A pest control expert should make sure the bedbugs are gone. You may need to throw away mattresses or luggage. Ask the pest control expert what you can do to keep the bedbugs from coming back. Common suggestions include: °¨ Putting a plastic cover over your mattress. °¨ Washing and drying your clothes and bedding in hot water and a hot dryer. The temperature should be hotter  than 120° F (48.9° C). Bedbugs are killed by high temperatures. °¨ Vacuuming carefully all around your bed. Vacuum in all cracks and crevices where the bugs might hide. Do this often. °¨ Carefully checking all used furniture, bedding, or clothes that you bring into your house. °¨ Eliminating bird nests and bat roosts. °· If you get bedbug bites when traveling, check all your possessions carefully before bringing them into your house. If you find any bugs on clothes or in your luggage, consider throwing those items away. °SEEK MEDICAL CARE IF: °· You have red bug bites that keep coming back. °· You have red bug bites that itch badly. °· You have bug bites that cause a skin rash. °· You have scratch marks that are red and sore. °SEEK IMMEDIATE MEDICAL CARE IF: °You have a fever. °Document Released: 02/17/2010 Document Revised: 04/09/2011 Document Reviewed: 02/17/2010 °ExitCare® Patient Information ©2015 [employer]. This information is not intended to replace advice given to you by your health care provider. Make sure you discuss any questions you have with your health care provider. °Insect Bite °Mosquitoes, flies, fleas, bedbugs, and many other insects can bite. Insect bites are different from insect stings. A sting is when venom is injected into the skin. Some insect bites can transmit infectious diseases. °SYMPTOMS  °Insect bites usually turn red, swell, and itch for 2 to 4 days. They often go away on their own. °TREATMENT  °Your caregiver may prescribe antibiotic medicines if a bacterial infection develops in the bite. °HOME CARE INSTRUCTIONS °· Do not scratch the bite area. °· Keep the bite area clean and dry. Wash the bite area thoroughly with soap and water. °· Put ice or cool compresses on the bite area. °¨ Put ice in a plastic bag. °¨ Place a towel between your skin and the bag. °¨ Leave the ice on for 20 minutes, 4 times a day for the first 2 to 3 days, or as directed. °· You may apply a baking soda  paste, cortisone cream, or calamine lotion to the bite area as directed by your caregiver. This can help reduce itching and swelling. °· Only take over-the-counter or prescription medicines as directed by your caregiver. °· If you are given antibiotics, take them as directed. Finish them even if you start to feel better. °You may need a tetanus shot if: °· You cannot remember when you had your last tetanus shot. °· You have never had a tetanus shot. °· The injury broke your skin. °If you get a tetanus shot, your arm may swell, get red, and feel warm to the touch. This is common and not a problem. If you need a tetanus shot and you choose not to have one, there is a rare chance of getting tetanus. Sickness from tetanus can be serious. °SEEK IMMEDIATE MEDICAL CARE IF:  °· You have increased pain, redness, or swelling in the bite area. °· You   see a red line on the skin coming from the bite. °· You have a fever. °· You have joint pain. °· You have a headache or neck pain. °· You have unusual weakness. °· You have a rash. °· You have chest pain or shortness of breath. °· You have abdominal pain, nausea, or vomiting. °· You feel unusually tired or sleepy. °MAKE SURE YOU:  °· Understand these instructions. °· Will watch your condition. °· Will get help right away if you are not doing well or get worse. °Document Released: 02/23/2004 Document Revised: 04/09/2011 Document Reviewed: 08/16/2010 °ExitCare® Patient Information ©2015 [employer]. This information is not intended to replace advice given to you by your health care provider. Make sure you discuss any questions you have with your health care provider. ° °

## 2014-07-03 NOTE — ED Notes (Signed)
Red bumps all over body. Pt stayed at a friends house and was later told she had bed bugs. X 2 days

## 2014-07-03 NOTE — ED Provider Notes (Signed)
CSN: [id number]     Arrival date & time 07/03/14  1419 History   First MD Initiated Contact with Patient 07/03/14 1452     Chief Complaint  Patient presents with  . Rash   (Consider location/radiation/quality/duration/timing/severity/associated sxs/prior Treatment) HPI Comments: 24 year old female presents with an itchy rash for 2 days. She has been sleeping on the sofa. The rash consist of isolated raised red papules located to the feet and legs chest and arms. No pustules. No drainage or bleeding.   Past Medical History  Diagnosis Date  . Migraine    History reviewed. No pertinent past surgical history. No family history on file. History  Substance Use Topics  . Smoking status: Never Smoker   . Smokeless tobacco: Not on file  . Alcohol Use: No   OB History    No data available     Review of Systems  Constitutional: Negative.   Respiratory: Negative.   Skin: Positive for rash.  Neurological: Negative.   All other systems reviewed and are negative.   Allergies  Amoxicillin and Penicillins  Home Medications   Prior to Admission medications   Medication Sig Start Date End Date Taking? Authorizing Provider  clotrimazole-betamethasone (LOTRISONE) cream Apply to affected area 2 times daily x 7 days 11/29/13   Ria ClockJennifer Lee H Presson, PA  MedroxyPROGESTERone Acetate (DEPO-PROVERA IM) Inject into the muscle every 3 (three) months.    Historical Provider, MD  triamcinolone cream (KENALOG) 0.1 % Apply 1 application topically 2 (two) times daily. 07/03/14   Hayden Rasmussenavid Lowen Mansouri, NP   BP 117/71 mmHg  Pulse 65  Temp(Src) 97.6 F (36.4 C) (Oral)  Resp 16  SpO2 98% Physical Exam  Constitutional: She appears well-developed and well-nourished. No distress.  Neck: Normal range of motion. Neck supple.  Pulmonary/Chest: Effort normal. No respiratory distress.  Musculoskeletal: She exhibits no edema.  Skin: Skin is warm and dry. Rash noted.  Red itchy papules approximately 3-5 mm in diameter  located to the trunk and extremities. These appear consistent with bedbug bites.  Nursing note and vitals reviewed.   ED Course  Procedures (including critical care time) Labs Review Labs Reviewed - No data to display  Imaging Review No results found.   MDM   1. Insect bites    Triamcinolone cream Must rid sofa and other areas that may contain bedbugs or other insects. Read instructions.    Hayden Rasmussenavid Sarah Baez, NP 07/03/14 1505

## 2014-07-27 ENCOUNTER — Encounter (HOSPITAL_COMMUNITY): Payer: Self-pay | Admitting: *Deleted

## 2014-07-27 ENCOUNTER — Emergency Department (INDEPENDENT_AMBULATORY_CARE_PROVIDER_SITE_OTHER)
Admission: EM | Admit: 2014-07-27 | Discharge: 2014-07-27 | Disposition: A | Discharge status: Home / Self Care | Payer: Medicaid Other | Source: Home / Self Care | Attending: Family Medicine | Admitting: Family Medicine

## 2014-07-27 DIAGNOSIS — H918X3 Other specified hearing loss, bilateral: Secondary | ICD-10-CM

## 2014-07-27 DIAGNOSIS — H6123 Impacted cerumen, bilateral: Secondary | ICD-10-CM

## 2014-07-27 NOTE — ED Notes (Signed)
Pt  Reports  r   Earache   With  Sensation            Of  Decreased hearing             Since  Yesterday           -  Pt  Reports  She  Tried  To clean  Her  Ears   Out herself  Without  Much  Success

## 2014-07-27 NOTE — ED Provider Notes (Signed)
CSN: [id number]     Arrival date & time 07/27/14  1458 History   First MD Initiated Contact with Patient 07/27/14 1656     Chief Complaint  Patient presents with  . Otalgia   (Consider location/radiation/quality/duration/timing/severity/associated sxs/prior Treatment) Patient is a 24 y.o. female presenting with ear pain. The history is provided by the patient.  Otalgia Location:  Right Behind ear:  No abnormality Quality:  Dull and pressure Severity:  Mild Onset quality:  Sudden (3am while asleep, onset, worse with qtip usage.) Progression:  Unchanged Chronicity:  New Relieved by:  None tried Worsened by:  Nothing tried Ineffective treatments:  None tried Associated symptoms: hearing loss   Associated symptoms: no congestion, no ear discharge, no fever and no rhinorrhea     Past Medical History  Diagnosis Date  . Migraine    History reviewed. No pertinent past surgical history. History reviewed. No pertinent family history. History  Substance Use Topics  . Smoking status: Never Smoker   . Smokeless tobacco: Not on file  . Alcohol Use: No   OB History    No data available     Review of Systems  Constitutional: Negative.  Negative for fever.  HENT: Positive for ear pain and hearing loss. Negative for congestion, dental problem, ear discharge, facial swelling and rhinorrhea.   Eyes: Negative.   Respiratory: Negative.     Allergies  Amoxicillin and Penicillins  Home Medications   Prior to Admission medications   Medication Sig Start Date End Date Taking? Authorizing Provider  clotrimazole-betamethasone (LOTRISONE) cream Apply to affected area 2 times daily x 7 days 11/29/13   Ria ClockJennifer Lee H Presson, PA  MedroxyPROGESTERone Acetate (DEPO-PROVERA IM) Inject into the muscle every 3 (three) months.    Historical Provider, MD  triamcinolone cream (KENALOG) 0.1 % Apply 1 application topically 2 (two) times daily. 07/03/14   Hayden Rasmussenavid Mabe, NP   BP 96/50 mmHg  Pulse 64   Temp(Src) 98.4 F (36.9 C) (Oral)  Resp 12  SpO2 100% Physical Exam  Constitutional: She appears well-developed and well-nourished.  HENT:  Head: Normocephalic.  Right Ear: No drainage. Decreased hearing is noted.  Left Ear: No drainage. Decreased hearing is noted.  Ears:  Nose: Nose normal.  Mouth/Throat: Oropharynx is clear and moist.  Nursing note and vitals reviewed.   ED Course  Procedures (including critical care time) Labs Review Labs Reviewed - No data to display  Imaging Review No results found.   MDM   1. Hearing loss due to cerumen impaction, bilateral    Sx improved after irrig. Canals and tm nl.    Linna HoffJames D Alyanna Stoermer, MD 07/27/14 [phone]

## 2014-09-15 ENCOUNTER — Inpatient Hospital Stay (HOSPITAL_COMMUNITY)
Admission: AD | Admit: 2014-09-15 | Discharge: 2014-09-16 | Disposition: A | Discharge status: Home / Self Care | Payer: Medicaid Other | Source: Ambulatory Visit | Attending: Obstetrics and Gynecology | Admitting: Obstetrics and Gynecology

## 2014-09-15 ENCOUNTER — Encounter (HOSPITAL_COMMUNITY): Payer: Self-pay

## 2014-09-15 DIAGNOSIS — R102 Pelvic and perineal pain: Secondary | ICD-10-CM | POA: Diagnosis present

## 2014-09-15 DIAGNOSIS — Z880 Allergy status to penicillin: Secondary | ICD-10-CM | POA: Diagnosis not present

## 2014-09-15 DIAGNOSIS — Z8249 Family history of ischemic heart disease and other diseases of the circulatory system: Secondary | ICD-10-CM | POA: Insufficient documentation

## 2014-09-15 DIAGNOSIS — N949 Unspecified condition associated with female genital organs and menstrual cycle: Secondary | ICD-10-CM | POA: Diagnosis not present

## 2014-09-15 DIAGNOSIS — Z833 Family history of diabetes mellitus: Secondary | ICD-10-CM | POA: Insufficient documentation

## 2014-09-15 DIAGNOSIS — N943 Premenstrual tension syndrome: Secondary | ICD-10-CM

## 2014-09-15 LAB — URINALYSIS, ROUTINE W REFLEX MICROSCOPIC
BILIRUBIN URINE: NEGATIVE
GLUCOSE, UA: NEGATIVE mg/dL
HGB URINE DIPSTICK: NEGATIVE
Ketones, ur: NEGATIVE mg/dL
Leukocytes, UA: NEGATIVE
Nitrite: NEGATIVE
Protein, ur: NEGATIVE mg/dL
SPECIFIC GRAVITY, URINE: 1.025 (ref 1.005–1.030)
UROBILINOGEN UA: 0.2 mg/dL (ref 0.0–1.0)
pH: 6.5 (ref 5.0–8.0)

## 2014-09-15 LAB — POCT PREGNANCY, URINE: PREG TEST UR: NEGATIVE

## 2014-09-15 MED ORDER — KETOROLAC TROMETHAMINE 60 MG/2ML IM SOLN
60.0000 mg | Freq: Once | INTRAMUSCULAR | Status: AC
  Administered 2014-09-16: 60 mg via INTRAMUSCULAR
  Filled 2014-09-15: qty 2

## 2014-09-15 NOTE — MAU Provider Note (Signed)
History     CSN: [id number]  Arrival date and time: 09/15/14 2254   First Provider Initiated Contact with Patient 09/15/14 2353      No chief complaint on file.  HPI Comments: Victoria Solis is a 24 y.o. G2P0 who presents today with cramping. She states that she has had the cramping off and on for about two days. She has not had a period in an extended amount of time. She had been on depo-provera since 2012, and has not had an injection since January. [facility].   Pelvic Pain The patient's primary symptoms include pelvic pain. This is a new problem. The current episode started yesterday. The problem occurs intermittently. The problem has been unchanged. Pain severity now: 7/10  The problem affects both sides. She is not pregnant. Associated symptoms include abdominal pain. Pertinent negatives include no constipation, diarrhea, dysuria, fever, frequency, nausea, urgency or vomiting. Nothing aggravates the symptoms. Treatments tried: pamprin  The treatment provided no relief. She is sexually active. It is unknown whether or not her partner has an STD. She uses nothing for contraception.    Past Medical History  Diagnosis Date  . Migraine     Past Surgical History  Procedure Laterality Date  . Cesarean section      Family History  Problem Relation Age of Onset  . Hypertension Mother   . Diabetes Mother   . Hypertension Father     Social History  Substance Use Topics  . Smoking status: Never Smoker   . Smokeless tobacco: Never Used  . Alcohol Use: No    Allergies:  Allergies  Allergen Reactions  . Amoxicillin Hives  . Penicillins Hives    Prescriptions prior to admission  Medication Sig Dispense Refill Last Dose  . OVER THE COUNTER MEDICATION "Pamprin"   09/15/2014 at Unknown time  . OVER THE COUNTER MEDICATION Vitamin C   09/15/2014 at Unknown time  . clotrimazole-betamethasone (LOTRISONE) cream Apply to affected area 2  times daily x 7 days 15 g 0   . MedroxyPROGESTERone Acetate (DEPO-PROVERA IM) Inject into the muscle every 3 (three) months.   07/28/2013  . triamcinolone cream (KENALOG) 0.1 % Apply 1 application topically 2 (two) times daily. 30 g 0     Review of Systems  Constitutional: Negative for fever.  Gastrointestinal: Positive for abdominal pain. Negative for nausea, vomiting, diarrhea and constipation.  Genitourinary: Positive for pelvic pain. Negative for dysuria, urgency and frequency.   Physical Exam   Blood pressure 123/78, pulse 59, temperature 98.2 F (36.8 C), temperature source Oral, resp. rate 16, height 5\' 2"  (1.575 m), weight 60.782 kg (134 lb), SpO2 100 %.  Physical Exam  Nursing note and vitals reviewed. Constitutional: She is oriented to person, place, and time. She appears well-developed and well-nourished. No distress.  HENT:  Head: Normocephalic.  Cardiovascular: Normal rate.   Respiratory: Effort normal.  GI: Soft. There is no tenderness. There is no rebound.  Neurological: She is alert and oriented to person, place, and time.  Skin: Skin is warm and dry.  Psychiatric: She has a normal mood and affect.   Results for orders placed or performed during the hospital encounter of 09/15/14 (from the past 24 hour(s))  Urinalysis, Routine w reflex microscopic (not at [facility])     Status: None   Collection Time: 09/15/14 11:20 PM  Result Value Ref Range   Color, Urine YELLOW YELLOW   APPearance CLEAR CLEAR  Specific Gravity, Urine 1.025 1.005 - 1.030   pH 6.5 5.0 - 8.0   Glucose, UA NEGATIVE NEGATIVE mg/dL   Hgb urine dipstick NEGATIVE NEGATIVE   Bilirubin Urine NEGATIVE NEGATIVE   Ketones, ur NEGATIVE NEGATIVE mg/dL   Protein, ur NEGATIVE NEGATIVE mg/dL   Urobilinogen, UA 0.2 0.0 - 1.0 mg/dL   Nitrite NEGATIVE NEGATIVE   Leukocytes, UA NEGATIVE NEGATIVE  Pregnancy, urine POC     Status: None   Collection Time: 09/15/14 11:38 PM  Result Value Ref Range   Preg Test, Ur  NEGATIVE NEGATIVE  Wet prep, genital     Status: None   Collection Time: 09/16/14 12:30 AM  Result Value Ref Range   Yeast Wet Prep HPF POC NONE SEEN NONE SEEN   Trich, Wet Prep NONE SEEN NONE SEEN   Clue Cells Wet Prep HPF POC NONE SEEN NONE SEEN   WBC, Wet Prep HPF POC NONE SEEN NONE SEEN    MAU Course  Procedures  MDM   Assessment and Plan   1. Premenstrual symptom    DC home Comfort measures reviewed  RX: ibuprofen 800 mg #30  Return to MAU as needed FU with OB as planned  Follow-up Information    Follow up with [facility] [place].   Contact information:   [address] [address] [phone]        Tawnya Crook 09/15/2014, 11:54 PM

## 2014-09-15 NOTE — MAU Note (Signed)
Pt reports lower abd cramping off/on x 2 days. LMP unk., pt states she was on Depo x 4 years and stopped it in January.

## 2014-09-16 DIAGNOSIS — N949 Unspecified condition associated with female genital organs and menstrual cycle: Secondary | ICD-10-CM

## 2014-09-16 LAB — WET PREP, GENITAL
CLUE CELLS WET PREP: NONE SEEN
TRICH WET PREP: NONE SEEN
WBC, Wet Prep HPF POC: NONE SEEN
YEAST WET PREP: NONE SEEN

## 2014-09-16 LAB — HIV ANTIBODY (ROUTINE TESTING W REFLEX): HIV Screen 4th Generation wRfx: NONREACTIVE

## 2014-09-16 MED ORDER — IBUPROFEN 800 MG PO TABS: 800.0000 mg | ORAL_TABLET | Freq: Three times a day (TID) | ORAL | Status: DC | PRN

## 2014-09-16 NOTE — Discharge Instructions (Signed)

## 2014-09-17 LAB — GC/CHLAMYDIA PROBE AMP (~~LOC~~) NOT AT ARMC
Chlamydia: NEGATIVE
NEISSERIA GONORRHEA: NEGATIVE

## 2014-12-09 ENCOUNTER — Emergency Department (HOSPITAL_COMMUNITY): Payer: No Typology Code available for payment source

## 2014-12-09 ENCOUNTER — Emergency Department (HOSPITAL_COMMUNITY)
Admission: EM | Admit: 2014-12-09 | Discharge: 2014-12-09 | Disposition: A | Discharge status: Home / Self Care | Payer: No Typology Code available for payment source | Attending: Emergency Medicine | Admitting: Emergency Medicine

## 2014-12-09 ENCOUNTER — Encounter (HOSPITAL_COMMUNITY): Payer: Self-pay | Admitting: Emergency Medicine

## 2014-12-09 DIAGNOSIS — Y998 Other external cause status: Secondary | ICD-10-CM | POA: Diagnosis not present

## 2014-12-09 DIAGNOSIS — Z7952 Long term (current) use of systemic steroids: Secondary | ICD-10-CM | POA: Diagnosis not present

## 2014-12-09 DIAGNOSIS — Y92410 Unspecified street and highway as the place of occurrence of the external cause: Secondary | ICD-10-CM | POA: Insufficient documentation

## 2014-12-09 DIAGNOSIS — Z8669 Personal history of other diseases of the nervous system and sense organs: Secondary | ICD-10-CM | POA: Insufficient documentation

## 2014-12-09 DIAGNOSIS — S0990XA Unspecified injury of head, initial encounter: Secondary | ICD-10-CM | POA: Diagnosis present

## 2014-12-09 DIAGNOSIS — Z880 Allergy status to penicillin: Secondary | ICD-10-CM | POA: Insufficient documentation

## 2014-12-09 DIAGNOSIS — S3992XA Unspecified injury of lower back, initial encounter: Secondary | ICD-10-CM | POA: Diagnosis not present

## 2014-12-09 DIAGNOSIS — S199XXA Unspecified injury of neck, initial encounter: Secondary | ICD-10-CM | POA: Diagnosis not present

## 2014-12-09 DIAGNOSIS — S0003XA Contusion of scalp, initial encounter: Secondary | ICD-10-CM | POA: Insufficient documentation

## 2014-12-09 DIAGNOSIS — M542 Cervicalgia: Secondary | ICD-10-CM

## 2014-12-09 DIAGNOSIS — Y9389 Activity, other specified: Secondary | ICD-10-CM | POA: Insufficient documentation

## 2014-12-09 DIAGNOSIS — Z3202 Encounter for pregnancy test, result negative: Secondary | ICD-10-CM | POA: Insufficient documentation

## 2014-12-09 DIAGNOSIS — S299XXA Unspecified injury of thorax, initial encounter: Secondary | ICD-10-CM | POA: Diagnosis not present

## 2014-12-09 DIAGNOSIS — M546 Pain in thoracic spine: Secondary | ICD-10-CM

## 2014-12-09 LAB — POC URINE PREG, ED: PREG TEST UR: NEGATIVE
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
## 2014-12-09 NOTE — Discharge Instructions (Signed)
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
## 2014-12-16 ENCOUNTER — Encounter (HOSPITAL_COMMUNITY): Payer: Self-pay | Admitting: *Deleted

## 2014-12-16 ENCOUNTER — Emergency Department (INDEPENDENT_AMBULATORY_CARE_PROVIDER_SITE_OTHER)
Admission: EM | Admit: 2014-12-16 | Discharge: 2014-12-16 | Disposition: A | Discharge status: Home / Self Care | Payer: Medicaid Other | Source: Home / Self Care

## 2014-12-16 DIAGNOSIS — H109 Unspecified conjunctivitis: Secondary | ICD-10-CM | POA: Diagnosis not present

## 2014-12-16 LAB — POCT PREGNANCY, URINE: Preg Test, Ur: NEGATIVE

## 2014-12-16 MED ORDER — TOBRAMYCIN 0.3 % OP SOLN: 1.0000 [drp] | Freq: Four times a day (QID) | OPHTHALMIC | Status: DC

## 2014-12-16 MED ORDER — IPRATROPIUM BROMIDE 0.06 % NA SOLN: 2.0000 | Freq: Four times a day (QID) | NASAL | Status: DC

## 2014-12-16 NOTE — ED Notes (Signed)
Pt  Reports   Irritation    l  Eye   With   Redness       -     Symptoms  Started      Today  She   Has         Some  Redness  To  The   l  Eye   denys  Any  Injury                The  Eye  Is  Watery  With  Some  Redness  Present

## 2014-12-16 NOTE — ED Provider Notes (Signed)
CSN: [id number]     Arrival date & time 12/16/14  1522 History   None    Chief Complaint  Patient presents with  . Eye Problem   (Consider location/radiation/quality/duration/timing/severity/associated sxs/prior Treatment) Patient is a 24 y.o. female presenting with eye problem. The history is provided by the patient.  Eye Problem Location:  L eye Quality:  Tearing Severity:  Mild Onset quality:  Gradual Duration:  8 hours Progression:  Unchanged Chronicity:  New Context comment:  [employer] [facility] [employer] Relieved by:  None tried Worsened by:  Nothing tried Ineffective treatments:  None tried Associated symptoms: crusting, discharge, inflammation and redness   Associated symptoms: no photophobia     Past Medical History  Diagnosis Date  . Migraine    Past Surgical History  Procedure Laterality Date  . Cesarean section     Family History  Problem Relation Age of Onset  . Hypertension Mother   . Diabetes Mother   . Hypertension Father    Social History  Substance Use Topics  . Smoking status: Never Smoker   . Smokeless tobacco: Never Used  . Alcohol Use: No   OB History    Gravida Para Term Preterm AB TAB SAB Ectopic Multiple Living   2         2     Review of Systems  Constitutional: Negative.   HENT: Positive for congestion and postnasal drip.   Eyes: Positive for discharge and redness. Negative for photophobia.  All other systems reviewed and are negative.   Allergies  Amoxicillin and Penicillins  Home Medications   Prior to Admission medications   Medication Sig Start Date End Date Taking? Authorizing Provider  clotrimazole-betamethasone (LOTRISONE) cream Apply to affected area 2 times daily x 7 days 11/29/13   Ria Clock, PA  ibuprofen (ADVIL,MOTRIN) 800 MG tablet Take 1 tablet (800 mg total) by mouth every 8 (eight) hours as needed. 09/16/14   Armando Reichert, CNM  ipratropium (ATROVENT) 0.06 % nasal spray Place 2 sprays into both nostrils 4  (four) times daily. 12/16/14   Linna Hoff, MD  OVER THE COUNTER MEDICATION "Pamprin"    Historical Provider, MD  OVER THE COUNTER MEDICATION Vitamin C    Historical Provider, MD  tobramycin (TOBREX) 0.3 % ophthalmic solution Place 1 drop into the left eye every 6 (six) hours. 12/16/14   Linna Hoff, MD  triamcinolone cream (KENALOG) 0.1 % Apply 1 application topically 2 (two) times daily. 07/03/14   Hayden Rasmussen, NP   Meds Ordered and Administered this Visit  Medications - No data to display  BP 104/76 mmHg  Pulse 78  Temp(Src) 98.6 F (37 C) (Oral)  Resp 18  SpO2 100%  LMP  (Exact Date) No data found.   Physical Exam  Constitutional: She is oriented to person, place, and time. She appears well-developed and well-nourished. No distress.  HENT:  Right Ear: External ear normal.  Left Ear: External ear normal.  Mouth/Throat: Oropharynx is clear and moist.  Eyes: EOM are normal. Pupils are equal, round, and reactive to light. Right eye exhibits no discharge. Left eye exhibits discharge. Right conjunctiva is not injected. Left conjunctiva is injected. No scleral icterus.  Neck: Normal range of motion. Neck supple.  Lymphadenopathy:    She has no cervical adenopathy.  Neurological: She is alert and oriented to person, place, and time.  Skin: Skin is warm and dry.  Nursing note and vitals reviewed.   ED Course  Procedures (  including critical care time)  Labs Review Labs Reviewed  POCT PREGNANCY, URINE    Imaging Review No results found.   Visual Acuity Review  Right Eye Distance:   Left Eye Distance:   Bilateral Distance:    Right Eye Near:   Left Eye Near:    Bilateral Near:         MDM   1. Conjunctivitis of left eye      Linna HoffJames D Kindl, MD 12/16/14 1721

## 2015-03-12 ENCOUNTER — Emergency Department (HOSPITAL_COMMUNITY)
Admission: EM | Admit: 2015-03-12 | Discharge: 2015-03-12 | Disposition: A | Discharge status: Home / Self Care | Payer: Medicaid Other | Attending: Emergency Medicine | Admitting: Emergency Medicine

## 2015-03-12 ENCOUNTER — Encounter (HOSPITAL_COMMUNITY): Payer: Self-pay | Admitting: Family Medicine

## 2015-03-12 DIAGNOSIS — Z880 Allergy status to penicillin: Secondary | ICD-10-CM | POA: Diagnosis not present

## 2015-03-12 DIAGNOSIS — Z79899 Other long term (current) drug therapy: Secondary | ICD-10-CM | POA: Diagnosis not present

## 2015-03-12 DIAGNOSIS — Z8679 Personal history of other diseases of the circulatory system: Secondary | ICD-10-CM | POA: Diagnosis not present

## 2015-03-12 DIAGNOSIS — H6121 Impacted cerumen, right ear: Secondary | ICD-10-CM | POA: Diagnosis not present

## 2015-03-12 DIAGNOSIS — H9201 Otalgia, right ear: Secondary | ICD-10-CM

## 2015-03-12 DIAGNOSIS — Z7952 Long term (current) use of systemic steroids: Secondary | ICD-10-CM | POA: Insufficient documentation

## 2015-03-12 MED ORDER — DOCUSATE SODIUM 50 MG/5ML PO LIQD
50.0000 mg | Freq: Once | ORAL | Status: AC
  Administered 2015-03-12: 50 mg via OTIC
  Filled 2015-03-12: qty 10

## 2015-03-12 NOTE — ED Notes (Signed)
Pt here for right ear pain and sts hurts when she put a cu tip in there. Seen at [facility] for the same before.

## 2015-03-12 NOTE — ED Notes (Signed)
Declined W/C at D/C and was escorted to lobby by RN. 

## 2015-03-12 NOTE — Discharge Instructions (Signed)
1. Medications: usual home medications 2. Treatment: rest, drink plenty of fluids, keep ears clean, do not use Q-tips 3. Follow Up: Please followup with your primary doctor in 2-3 days for discussion of your diagnoses and further evaluation after today's visit; if you do not have a primary care doctor use the resource guide provided to find one; Please return to the ER for worsening symptoms   Cerumen Impaction The structures of the external ear canal secrete a waxy substance known as cerumen. Excess cerumen can build up in the ear canal, causing a condition known as cerumen impaction. Cerumen impaction can cause ear pain and disrupt the function of the ear. The rate of cerumen production differs for each individual. In certain individuals, the configuration of the ear canal may decrease his or her ability to naturally remove cerumen. CAUSES Cerumen impaction is caused by excessive cerumen production or buildup. RISK FACTORS  Frequent use of swabs to clean ears.  Having narrow ear canals.  Having eczema.  Being dehydrated. SIGNS AND SYMPTOMS  Diminished hearing.  Ear drainage.  Ear pain.  Ear itch. TREATMENT Treatment may involve:  Over-the-counter or prescription ear drops to soften the cerumen.  Removal of cerumen by a health care provider. This may be done with:  Irrigation with warm water. This is the most common method of removal.  Ear curettes and other instruments.  Surgery. This may be done in severe cases. HOME CARE INSTRUCTIONS  Take medicines only as directed by your health care provider.  Do not insert objects into the ear with the intent of cleaning the ear. PREVENTION  Do not insert objects into the ear, even with the intent of cleaning the ear. Removing cerumen as a part of normal hygiene is not necessary, and the use of swabs in the ear canal is not recommended.  Drink enough water to keep your urine clear or pale yellow.  Control your eczema if you  have it. SEEK MEDICAL CARE IF:  You develop ear pain.  You develop bleeding from the ear.  The cerumen does not clear after you use ear drops as directed.   This information is not intended to replace advice given to you by your health care provider. Make sure you discuss any questions you have with your health care provider.   Document Released: 02/23/2004 Document Revised: 02/05/2014 Document Reviewed: 09/01/2014 Elsevier Interactive Patient Education 2016 [employer].    Emergency Department Resource Guide 1) Find a Doctor and Pay Out of Pocket Although you won't have to find out who is covered by your insurance plan, it is a good idea to ask around and get recommendations. You will then need to call the office and see if the doctor you have chosen will accept you as a new patient and what types of options they offer for patients who are self-pay. Some doctors offer discounts or will set up payment plans for their patients who do not have insurance, but you will need to ask so you aren't surprised when you get to your appointment.  2) Contact Your Local Health Department Not all health departments have doctors that can see patients for sick visits, but many do, so it is worth a call to see if yours does. If you don't know where your local health department is, you can check in your phone book. The CDC also has a tool to help you locate your state's health department, and many state websites also have listings of all of their local  health departments.  3) Find a Walk-in Clinic If your illness is not likely to be very severe or complicated, you may want to try a walk in clinic. These are popping up all over the country in pharmacies, drugstores, and shopping centers. They're usually staffed by nurse practitioners or physician assistants that have been trained to treat common illnesses and complaints. They're usually fairly quick and inexpensive. However, if you have serious medical issues  or chronic medical problems, these are probably not your best option.  No Primary Care Doctor: - Call Health Connect at  [phone] - they can help you locate a primary care doctor that  accepts your insurance, provides certain services, etc. - Physician Referral Service- [phone]  Chronic Pain Problems: Organization         Address  Phone   Notes  [facility]  [phone] Patients need to be referred by their primary care doctor.   Medication Assistance: Organization         Address  Phone   Notes  [facility] Medication [facility] [address] [place] [place], [place] [address] [phone] --Must be a resident of [facility] -- Must have NO insurance coverage whatsoever (no Medicaid/ Medicare, etc.) -- The pt. MUST have a primary care doctor that directs their care regularly and follows them in the community   Med[facility]ist  [phone]   [employer]  [phone]    Agencies that provide inexpensive medical care: Organization         Address  Phone   Notes  [facility]  [phone]   [facility]    [phone]   [facility] [address] [place], [place] [address] [phone]   [facility] [address] [place] [phone]   Planned Parenthood    [phone]   [facility]    [phone]   [facility]  [address], [place] Phone:  [phone], Fax:  [phone] Hours of Operation:  9 am - 6 pm, M-F.  Also accepts Medicaid/Medicare and self-pay.  [facility]  [address], [place] Phone: [phone], Fax: [phone]. Hours of Operation:  8:30 am - 5:30 pm, M-F.  Also accepts Medicaid and self-pay.  [facility] [place] [address] [place] [address] [place] Phone: [phone]   [facility] [address] Natasha Bence [place], [place]  [phone], Ext. [phone] Mondays & Thursdays: 7-9 AM.  First 15 patients are seen on a first come, first serve basis.    Medicaid-accepting [facility] Providers:  Organization         Address  Phone   Notes  [facility] [address], [place] [phone] Also accepts self-pay patients.  [facility] [address] Laurell Josephs [place], [place]  [phone]   [facility] [address], [place] [phone]   [facility] [address], [place] [phone]   Renaye Rakers [address], [place]   [phone] Only accepts [place] Access [place] patients after they have their name applied to their card.   Self-Pay (no insurance) in [facility]:  Organization         Address  Phone   Notes  Sickle Cell Patients, [employer]  Internal Medicine [address] [place], [place] [phone]   [facility] Care [address] [place], [place] [phone]   [facility] [place]  [address], [place] [phone]   [facility]/Dr. Osei-Bonsu  [address] [place] or [address], [place] [phone] Phone number for both [place] and [place] locations is the same.  [facility] and [facility] [address] [place] [phone]   [facility] [address] [place] or [address] [phone]   [facility] [address], [place] [phone], phone; [phone], fax Sees patients 1st and 3rd Saturday of every month.  Must not qualify for public or private insurance (i.e. Medicaid, Medicare, [place] Health Choice, Veterans' Benefits)  Household income should be no more than 200% of the poverty level The clinic cannot treat you if you are pregnant or think you are pregnant  Sexually transmitted  diseases are not treated at the clinic.    Dental Care: Organization         Address  Phone  Notes  [facility] of [facility] [address] [place], [place] [phone] Accepts children up to age 33 who are enrolled in [place] or [place] Health Choice; pregnant women with a Medicaid card; and children who have applied for Medicaid or [place] Health Choice, but were declined, whose parents can pay a reduced fee at time of service.  [facility] Department of [facility]  [address], [place] [phone] Accepts children up to age 75 who are enrolled in [place] or [place] Health Choice; pregnant women with a Medicaid card; and children who have applied for Medicaid or  Health Choice, but were declined, whose parents can pay a reduced fee at time of service.  [facility]  [address] [place], [place] [phone] Patients are seen by appointment only. Walk-ins are not accepted. [facility] will see patients 50 years of age and older. Monday - Tuesday (8am-5pm) Most Wednesdays (8:30-5pm) $30 per visit, cash only  [facility] Adult Dental Access PRO[facility]  [address], [facility] [phone] Patients are seen by appointment only. Walk-ins are not accepted. [facility] will see patients 9 years of age and older. One Wednesday Evening (Monthly: Volunteer Based).  $30 per visit, cash only  [employer]  [phone] for adults; Children under age 18, call Graduate Pediatric Dentistry at [phone]. Children aged 23-14, please call [phone] to request a pediatric application.  Dental services are provided in all areas of dental care including fillings, crowns and bridges, complete and partial dentures, implants, gum treatment, root canals, and extractions. Preventive care is also provided. Treatment is provided to both adults and children. Patients are selected via a  lottery and there is often a waiting list.   [facility] [address] [place]  [phone] [email or web address]   [facility] [address] [place], [place] [phone], Ext. [phone] Second and Fourth Thursday of each month, opens at 6:30 AM; Clinic ends at 9 AM.  Patients are seen on a first-come first-served basis, and a limited number are seen during each clinic.   [facility]  [address] Ether Griffins [place], [place] [phone]   Eligibility Requirements You must have  lived in [place], [place], or [place] counties for at least the last three months.   You cannot be eligible for state or federal sponsored [place], including [employer], [place], or [employer].   You generally cannot be eligible for healthcare insurance through your employer.    How to apply: Eligibility screenings are held every Tuesday and Wednesday afternoon from 1:00 pm until 4:00 pm. You do not need an appointment for the interview!  [facility] [address] [place], [place] [phone]   [facility] Health Department  [phone]   [facility] Health Department  [phone]   [facility] Health Department  [phone]    Behavioral Health Resources in the Community: Intensive Outpatient Programs Organization         Address  Phone  Notes  [facility] Services [address], [place], [place] [phone]   [facility] [address] [place], [place] [phone]   [facility] [address] [place], [place]  [phone]   [facility] [address],  [place], [place] [phone] or [phone]   Substance Abuse Resources Organization         Address  Phone  Notes  [facility]  [phone]   [facility]  [phone]   [facility]  [phone]   Floydene Flock  [phone]   Residential &  Outpatient Substance Abuse Program  [phone]   Psychological Services Organization         Address  Phone  Notes  [facility]  [phone]   [facility]  [phone]   [facility] [address] [place] [phone] or [phone]    Mobile Crisis Teams Organization         Address  Phone  Notes  [facility], Mobile Crisis Care Unit  [phone]   [facility]  [address] [place], [place] [phone]   Doristine Locks [address] [place] [phone]    Self-Help/Support Groups Organization         Address  Phone             Notes  [facility] [place] - variety of support groups  336- I7437963 Call for more information  Narcotics Anonymous (NA), [facility] [address], [employer] [place]  2 meetings at this location   Statistician         Address  Phone  Notes  [facility] [address],    [place]  [phone]   [facility]  [address] [place] [address], [place], [place] [phone]   [facility] [address] [place], [place] [phone] Admissions: 8am-3pm M-F  [facility] [address],    [place], [place] [phone]   [facility] [address] Starling Manns [place], [place] [phone]   [facility] [address],  [place], [place] [phone]   [facility] - Intensive Outpatient [address] Laurell Josephs [address], [place], [place] [phone]   [facility] ([facility]iction [facility] [address] [place]  [place], [place] [phone] or [phone]   [facility] ([facility]) [address] [place], [place] [phone] Accepts Medicaid  [facility] [address]  [place] [phone] Substance Abuse/Addiction Treatment   [facility] Resources Organization          Address  Phone  Notes  [facility]  8308232758   Angie Fava, PhD [address] Ervin Knack [place], [place]   [phone] or [phone]   [facility] Behavioral   [address] [place], [place] [phone]   [facility] [address] [place], [place] [phone] Insurance/Medicaid/sponsorship through [facility] and Families [address]                                    [place], [place] [phone] Therapy/tele-psych/case  [facility] [address] [place], [place] [phone]    Dr. Lolly Mustache  [phone]   [facility]  [employer] [facility] Dept. 1) [address] [place] 2) [address] [place] 3)  [address], [place] [phone]  [phone]   [facility] Child Abuse Hotline [phone] or [phone] (After Hours)      '

## 2015-03-12 NOTE — ED Provider Notes (Signed)
CSN: [id number]     Arrival date & time 03/12/15  1100 History  By signing my name below, I, Freida Busman, attest that this documentation has been prepared under the direction and in the presence of non-physician practitioner, Dierdre Forth, PA-C. Electronically Signed: Freida Busman, Scribe. 03/12/2015. 12:28 PM.   Chief Complaint  Patient presents with  . Otalgia    The history is provided by the patient. No language interpreter was used.     HPI Comments:  Victoria Solis is a 25 y.o. female who presents to the Emergency Department complaining of moderate constant right ear pain x 1 week. She denies rhinorrhea, sore throat, and left ear pain. Pt admits she uses q-tips to clean her ears. Pt has no other complaints or symptoms at this time. No alleviating factors noted. No treatments PTA.     Past Medical History  Diagnosis Date  . Migraine    Past Surgical History  Procedure Laterality Date  . Cesarean section     Family History  Problem Relation Age of Onset  . Hypertension Mother   . Diabetes Mother   . Hypertension Father    Social History  Substance Use Topics  . Smoking status: Never Smoker   . Smokeless tobacco: Never Used  . Alcohol Use: No   OB History    Gravida Para Term Preterm AB TAB SAB Ectopic Multiple Living   2         2     Review of Systems  Constitutional: Negative for fever and chills.  HENT: Positive for ear pain. Negative for congestion, rhinorrhea and sore throat.   Respiratory: Negative for shortness of breath.   Cardiovascular: Negative for chest pain.  Gastrointestinal: Negative for nausea, vomiting and abdominal pain.  Genitourinary: Negative for dysuria and hematuria.  Musculoskeletal: Negative for back pain and neck pain.  Skin: Negative for rash.  Neurological: Negative for headaches.  All other systems reviewed and are negative.     Allergies  Amoxicillin and Penicillins  Home Medications   Prior to Admission  medications   Medication Sig Start Date End Date Taking? Authorizing Provider  clotrimazole-betamethasone (LOTRISONE) cream Apply to affected area 2 times daily x 7 days 11/29/13   Ria Clock, PA  ibuprofen (ADVIL,MOTRIN) 800 MG tablet Take 1 tablet (800 mg total) by mouth every 8 (eight) hours as needed. 09/16/14   Armando Reichert, CNM  ipratropium (ATROVENT) 0.06 % nasal spray Place 2 sprays into both nostrils 4 (four) times daily. 12/16/14   Linna Hoff, MD  OVER THE COUNTER MEDICATION "Pamprin"    Historical Provider, MD  OVER THE COUNTER MEDICATION Vitamin C    Historical Provider, MD  tobramycin (TOBREX) 0.3 % ophthalmic solution Place 1 drop into the left eye every 6 (six) hours. 12/16/14   Linna Hoff, MD  triamcinolone cream (KENALOG) 0.1 % Apply 1 application topically 2 (two) times daily. 07/03/14   Hayden Rasmussen, NP   BP 107/72 mmHg  Pulse 53  Temp(Src) 97.9 F (36.6 C) (Oral)  Resp 18  SpO2 100% Physical Exam  Constitutional: She is oriented to person, place, and time. She appears well-developed and well-nourished. No distress.  HENT:  Head: Normocephalic and atraumatic.  Right Ear: External ear normal.  Left Ear: Tympanic membrane, external ear and ear canal normal.  Nose: Nose normal. No mucosal edema or rhinorrhea. No epistaxis. Right sinus exhibits no maxillary sinus tenderness and no frontal sinus tenderness. Left sinus exhibits  no maxillary sinus tenderness and no frontal sinus tenderness.  Mouth/Throat: Uvula is midline and mucous membranes are normal. Mucous membranes are not pale and not cyanotic. No oropharyngeal exudate, posterior oropharyngeal edema, posterior oropharyngeal erythema or tonsillar abscesses.  Cerumen impaction in right ear  Eyes: Conjunctivae are normal. Pupils are equal, round, and reactive to light.  Neck: Normal range of motion and full passive range of motion without pain.  Cardiovascular: Normal rate and intact distal pulses.    Pulmonary/Chest: Effort normal and breath sounds normal. No stridor.  Clear and equal breath sounds without focal wheezes, rhonchi, rales  Abdominal: Soft. Bowel sounds are normal. There is no tenderness.  Musculoskeletal: Normal range of motion.  Lymphadenopathy:    She has no cervical adenopathy.  Neurological: She is alert and oriented to person, place, and time.  Skin: Skin is warm and dry. No rash noted. She is not diaphoretic.  Psychiatric: She has a normal mood and affect.  Nursing note and vitals reviewed.   ED Course  .Ear Cerumen Removal Date/Time: 03/12/2015 1:16 PM Performed by: Dierdre Forth Authorized by: Dierdre Forth Consent: Verbal consent obtained. Consent given by: patient Patient understanding: patient states understanding of the procedure being performed Patient consent: the patient's understanding of the procedure matches consent given Procedure consent: procedure consent matches procedure scheduled Relevant documents: relevant documents present and verified Site marked: the operative site was marked Required items: required blood products, implants, devices, and special equipment available Patient identity confirmed: verbally with patient and arm band Time out: Immediately prior to procedure a "time out" was called to verify the correct patient, procedure, equipment, support staff and site/side marked as required. Ceruminolytics applied: Ceruminolytics applied prior to the procedure. Location details: right ear Procedure type: curette and irrigation Patient sedated: no Patient tolerance: Patient tolerated the procedure well with no immediate complications     DIAGNOSTIC STUDIES:  Oxygen Saturation is 100% on RA, normal by my interpretation.    COORDINATION OF CARE:  12:27 PM Will disimpact right ear.  Discussed treatment plan with pt at bedside and pt agreed to plan.    MDM   Final diagnoses:  Cerumen impaction, right  Otalgia of  right ear    Victoria Solis presents with a right otalgia.  Clinically with cerumen impaction in the right ear.  1:32 PM Cerumen removed with resolution of pain. Repeat exam shows no evidence of otitis media or otitis externa.  Recommend home ear cleaning and close follow-up with PCP.  I personally performed the services described in this documentation, which was scribed in my presence. The recorded information has been reviewed and is accurate.   Dahlia Client Mozell Haber, PA-C 03/12/15 1332  Margarita Grizzle, MD 03/13/15 [phone]

## 2016-06-27 LAB — OB RESULTS CONSOLE GBS: GBS: NEGATIVE

## 2016-08-08 ENCOUNTER — Encounter (HOSPITAL_COMMUNITY): Payer: Self-pay | Admitting: *Deleted

## 2016-08-08 ENCOUNTER — Ambulatory Visit (HOSPITAL_COMMUNITY)
Admission: EM | Admit: 2016-08-08 | Discharge: 2016-08-08 | Disposition: A | Discharge status: Home / Self Care | Payer: Medicaid Other | Attending: Internal Medicine | Admitting: Internal Medicine

## 2016-08-08 DIAGNOSIS — R509 Fever, unspecified: Secondary | ICD-10-CM | POA: Diagnosis present

## 2016-08-08 DIAGNOSIS — Z880 Allergy status to penicillin: Secondary | ICD-10-CM | POA: Insufficient documentation

## 2016-08-08 DIAGNOSIS — N10 Acute tubulo-interstitial nephritis: Secondary | ICD-10-CM | POA: Diagnosis not present

## 2016-08-08 DIAGNOSIS — Z833 Family history of diabetes mellitus: Secondary | ICD-10-CM | POA: Diagnosis not present

## 2016-08-08 DIAGNOSIS — Z8249 Family history of ischemic heart disease and other diseases of the circulatory system: Secondary | ICD-10-CM | POA: Diagnosis not present

## 2016-08-08 LAB — POCT PREGNANCY, URINE: Preg Test, Ur: NEGATIVE

## 2016-08-08 MED ORDER — CIPROFLOXACIN HCL 500 MG PO TABS: 500.0000 mg | ORAL_TABLET | Freq: Two times a day (BID) | ORAL | 0 refills | Status: DC

## 2016-08-08 MED ORDER — KETOROLAC TROMETHAMINE 30 MG/ML IJ SOLN: 30.0000 mg | Freq: Once | INTRAMUSCULAR | Status: DC

## 2016-08-08 MED ORDER — ONDANSETRON 4 MG PO TBDP
ORAL_TABLET | ORAL | Status: AC
  Filled 2016-08-08: qty 1

## 2016-08-08 MED ORDER — PHENAZOPYRIDINE HCL 200 MG PO TABS
200.0000 mg | ORAL_TABLET | Freq: Three times a day (TID) | ORAL | 0 refills | Status: DC
Start: 2016-08-08 — End: 2016-09-28

## 2016-08-08 MED ORDER — ONDANSETRON 4 MG PO TBDP: 4.0000 mg | ORAL_TABLET | Freq: Three times a day (TID) | ORAL | 0 refills | Status: DC | PRN

## 2016-08-08 MED ORDER — KETOROLAC TROMETHAMINE 30 MG/ML IJ SOLN
INTRAMUSCULAR | Status: AC
  Filled 2016-08-08: qty 1

## 2016-08-08 MED ORDER — ACETAMINOPHEN 325 MG PO TABS
975.0000 mg | ORAL_TABLET | Freq: Once | ORAL | Status: AC
Start: 2016-08-08 — End: 2016-08-08
  Administered 2016-08-08: 975 mg via ORAL

## 2016-08-08 MED ORDER — ONDANSETRON 4 MG PO TBDP
4.0000 mg | ORAL_TABLET | Freq: Once | ORAL | Status: AC
  Administered 2016-08-08: 4 mg via ORAL

## 2016-08-08 MED ORDER — ACETAMINOPHEN 325 MG PO TABS
ORAL_TABLET | ORAL | Status: AC
  Filled 2016-08-08: qty 3

## 2016-08-08 NOTE — ED Provider Notes (Signed)
CSN: [id number]     Arrival date & time 08/08/16  1910 History   First MD Initiated Contact with Patient 08/08/16 1940     Chief Complaint  Patient presents with  . Fever   (Consider location/radiation/quality/duration/timing/severity/associated sxs/prior Treatment) Patient c/o back pain, fever, and headache.  She had to leave work because she has fever and is sick.  She is nauseated.   The history is provided by the patient.  Fever  Max temp prior to arrival:  101.5 Temp source:  Oral Severity:  Moderate Onset quality:  Sudden Duration:  1 day Timing:  Constant Progression:  Worsening Chronicity:  New Relieved by:  Nothing Worsened by:  Nothing Ineffective treatments:  None tried Associated symptoms: chills, headaches, myalgias and nausea     Past Medical History:  Diagnosis Date  . Migraine    Past Surgical History:  Procedure Laterality Date  . CESAREAN SECTION     Family History  Problem Relation Age of Onset  . Hypertension Mother   . Diabetes Mother   . Hypertension Father    Social History  Substance Use Topics  . Smoking status: Never Smoker  . Smokeless tobacco: Never Used  . Alcohol use No   OB History    Gravida Para Term Preterm AB Living   2         2   SAB TAB Ectopic Multiple Live Births           2     Review of Systems  Constitutional: Positive for chills and fever.  HENT: Negative.   Eyes: Negative.   Respiratory: Negative.   Cardiovascular: Negative.   Gastrointestinal: Positive for nausea.  Endocrine: Negative.   Genitourinary: Negative.   Musculoskeletal: Positive for arthralgias, back pain and myalgias.  Allergic/Immunologic: Negative.   Neurological: Positive for headaches.  Hematological: Negative.   Psychiatric/Behavioral: Negative.     Allergies  Amoxicillin and Penicillins  Home Medications   Prior to Admission medications   Medication Sig Start Date End Date Taking? Authorizing Provider  ciprofloxacin (CIPRO) 500  MG tablet Take 1 tablet (500 mg total) by mouth 2 (two) times daily. 08/08/16   Deatra Canter, FNP  clotrimazole-betamethasone (LOTRISONE) cream Apply to affected area 2 times daily x 7 days 11/29/13   Presson, Mathis Fare, PA  ibuprofen (ADVIL,MOTRIN) 800 MG tablet Take 1 tablet (800 mg total) by mouth every 8 (eight) hours as needed. 09/16/14   Thressa Sheller D, CNM  ipratropium (ATROVENT) 0.06 % nasal spray Place 2 sprays into both nostrils 4 (four) times daily. 12/16/14   Linna Hoff, MD  ondansetron (ZOFRAN ODT) 4 MG disintegrating tablet Take 1 tablet (4 mg total) by mouth every 8 (eight) hours as needed for nausea or vomiting. 08/08/16   Hydie Langan, Anselm Pancoast, FNP  OVER THE COUNTER MEDICATION "Pamprin"    [provider]  OVER THE COUNTER MEDICATION Vitamin C    [provider]  phenazopyridine (PYRIDIUM) 200 MG tablet Take 1 tablet (200 mg total) by mouth 3 (three) times daily. 08/08/16   Deatra Canter, FNP  tobramycin (TOBREX) 0.3 % ophthalmic solution Place 1 drop into the left eye every 6 (six) hours. 12/16/14   Linna Hoff, MD  triamcinolone cream (KENALOG) 0.1 % Apply 1 application topically 2 (two) times daily. 07/03/14   Hayden Rasmussen, NP   Meds Ordered and Administered this Visit   Medications  ketorolac (TORADOL) 30 MG/ML injection 30 mg (not administered)  acetaminophen (  TYLENOL) tablet 975 mg (975 mg Oral Given 08/08/16 1956)  ondansetron (ZOFRAN-ODT) disintegrating tablet 4 mg (4 mg Oral Given 08/08/16 1956)    BP 124/70 (BP Location: Right Arm)   Pulse 88   Temp (!) 101.4 F (38.6 C) (Oral)   Resp 16   SpO2 100%  No data found.   Physical Exam  Constitutional: She is oriented to person, place, and time. She appears well-developed and well-nourished.  HENT:  Head: Normocephalic and atraumatic.  Right Ear: External ear normal.  Left Ear: External ear normal.  Mouth/Throat: Oropharynx is clear and moist.  Eyes: Conjunctivae and EOM are normal.  Pupils are equal, round, and reactive to light.  Neck: Normal range of motion. Neck supple.  Cardiovascular: Normal rate, regular rhythm and normal heart sounds.   Pulmonary/Chest: Effort normal and breath sounds normal.  Abdominal: Soft. Bowel sounds are normal.  Genitourinary:  Genitourinary Comments: CVA tenderness right side  Neurological: She is alert and oriented to person, place, and time.  Nursing note and vitals reviewed.   Urgent Care Course     Procedures (including critical care time)  Labs Review Labs Reviewed  URINE CULTURE  POCT PREGNANCY, URINE    Imaging Review No results found.   Visual Acuity Review  Right Eye Distance:   Left Eye Distance:   Bilateral Distance:    Right Eye Near:   Left Eye Near:    Bilateral Near:         MDM   1. Acute pyelonephritis    Tylenol Toradol 30mg  IM Cipro 500mg  one po bid x 10 days #20 Pyridium UA cx  Note for work  Push po fluids, rest, tylenol and motrin otc prn as directed for fever, arthralgias, and myalgias.  Follow up prn if sx's continue or persist.    Deatra CanterOxford, Britanee Vanblarcom J, FNP 08/08/16 2012

## 2016-08-08 NOTE — ED Notes (Signed)
Patient refused Toradol injection.

## 2016-08-08 NOTE — ED Triage Notes (Signed)
Pt  Reports  Symptoms  Of  Frontal  Headaches   Fever       And  Back  Pain  For  Several  Days        Pt  Denies  Any  Urinary         Symptoms

## 2016-08-11 LAB — URINE CULTURE: Culture: >=100000 — AB

## 2016-09-28 ENCOUNTER — Ambulatory Visit (INDEPENDENT_AMBULATORY_CARE_PROVIDER_SITE_OTHER): Payer: Medicaid Other | Admitting: Obstetrics

## 2016-09-28 ENCOUNTER — Other Ambulatory Visit (HOSPITAL_COMMUNITY)
Admission: RE | Admit: 2016-09-28 | Discharge: 2016-09-28 | Disposition: A | Discharge status: Home / Self Care | Payer: Medicaid Other | Source: Ambulatory Visit | Attending: Obstetrics | Admitting: Obstetrics

## 2016-09-28 ENCOUNTER — Encounter: Payer: Self-pay | Admitting: Obstetrics

## 2016-09-28 VITALS — BP 122/78 | HR 66 | Wt 167.6 lb

## 2016-09-28 DIAGNOSIS — R399 Unspecified symptoms and signs involving the genitourinary system: Secondary | ICD-10-CM

## 2016-09-28 DIAGNOSIS — Z30011 Encounter for initial prescription of contraceptive pills: Secondary | ICD-10-CM

## 2016-09-28 DIAGNOSIS — Z01419 Encounter for gynecological examination (general) (routine) without abnormal findings: Secondary | ICD-10-CM

## 2016-09-28 DIAGNOSIS — N898 Other specified noninflammatory disorders of vagina: Secondary | ICD-10-CM

## 2016-09-28 DIAGNOSIS — N939 Abnormal uterine and vaginal bleeding, unspecified: Secondary | ICD-10-CM

## 2016-09-28 DIAGNOSIS — Z3049 Encounter for surveillance of other contraceptives: Secondary | ICD-10-CM

## 2016-09-28 DIAGNOSIS — Z3009 Encounter for other general counseling and advice on contraception: Secondary | ICD-10-CM

## 2016-09-28 LAB — POCT URINALYSIS DIPSTICK
BILIRUBIN UA: NEGATIVE
Blood, UA: NEGATIVE
Glucose, UA: NEGATIVE
KETONES UA: NEGATIVE
Leukocytes, UA: NEGATIVE
NITRITE UA: NEGATIVE
Spec Grav, UA: 1.015 (ref 1.010–1.025)
Urobilinogen, UA: NEGATIVE E.U./dL — AB
pH, UA: 5 (ref 5.0–8.0)

## 2016-09-28 LAB — POCT URINE PREGNANCY: PREG TEST UR: NEGATIVE

## 2016-09-28 MED ORDER — NORETHINDRONE-ETH ESTRADIOL 1-35 MG-MCG PO TABS: 1.0000 | ORAL_TABLET | Freq: Every day | ORAL | 11 refills | Status: DC

## 2016-09-28 MED ORDER — METRONIDAZOLE 500 MG PO TABS: 500.0000 mg | ORAL_TABLET | Freq: Two times a day (BID) | ORAL | 0 refills | Status: DC

## 2016-09-28 NOTE — Progress Notes (Signed)
Subjective:        Victoria Solis is a 26 y.o. female here for a routine exam.  Current complaints: Irregular periods.  Trying to conceive, unsuccessful over the past year.   Personal health questionnaire:  Is patient Ashkenazi Jewish, have a family history of breast and/or ovarian cancer: no Is there a family history of uterine cancer diagnosed at age < 60, gastrointestinal cancer, urinary tract cancer, family member who is a Personnel officer syndrome-associated carrier: no Is the patient overweight and hypertensive, family history of diabetes, personal history of gestational diabetes, preeclampsia or PCOS: no Is patient over 53, have PCOS,  family history of premature CHD under age 70, diabetes, smoke, have hypertension or peripheral artery disease:  no At any time, has a partner hit, kicked or otherwise hurt or frightened you?: no Over the past 2 weeks, have you felt down, depressed or hopeless?: no Over the past 2 weeks, have you felt little interest or pleasure in doing things?:no   Gynecologic History Patient's last menstrual period was 06/15/2016. Contraception: none Last Pap: 2011. Results were: normal Last mammogram: n/a. Results were: n/a  Obstetric History OB History  Gravida Para Term Preterm AB Living  2         2  SAB TAB Ectopic Multiple Live Births          2    # Outcome Date GA Lbr Len/2nd Weight Sex Delivery Anes PTL Lv  2 Gravida      Vag-Spont   LIV  1 Gravida      CS-Unspec   LIV      Past Medical History:  Diagnosis Date  . Migraine     Past Surgical History:  Procedure Laterality Date  . CESAREAN SECTION       Current Outpatient Prescriptions:  .  ibuprofen (ADVIL,MOTRIN) 800 MG tablet, Take 1 tablet (800 mg total) by mouth every 8 (eight) hours as needed., Disp: 30 tablet, Rfl: 0 .  metroNIDAZOLE (FLAGYL) 500 MG tablet, Take 1 tablet (500 mg total) by mouth 2 (two) times daily., Disp: 14 tablet, Rfl: 0 .  norethindrone-ethinyl estradiol 1/35  (ORTHO-NOVUM 1/35, 28,) tablet, Take 1 tablet by mouth daily., Disp: 1 Package, Rfl: 11 .  OVER THE COUNTER MEDICATION, "Pamprin", Disp: , Rfl:  Allergies  Allergen Reactions  . Amoxicillin Hives  . Penicillins Hives    Social History  Substance Use Topics  . Smoking status: Never Smoker  . Smokeless tobacco: Never Used  . Alcohol use No    Family History  Problem Relation Age of Onset  . Hypertension Mother   . Diabetes Mother   . Stroke Mother   . Kidney failure Mother   . Hypertension Father   . Diabetes Father       Review of Systems  Constitutional: negative for fatigue and weight loss Respiratory: negative for cough and wheezing Cardiovascular: negative for chest pain, fatigue and palpitations Gastrointestinal: negative for abdominal pain and change in bowel habits Musculoskeletal:negative for myalgias Neurological: negative for gait problems and tremors Behavioral/Psych: negative for abusive relationship, depression Endocrine: negative for temperature intolerance    Genitourinary:positive for abnormal menstrual periods Integument/breast: negative for breast lump, breast tenderness, nipple discharge and skin lesion(s)    Objective:       BP 122/78   Pulse 66   Wt 167 lb 9.6 oz (76 kg)   LMP 06/15/2016 Comment: was on Depo but since stopped she has always been irregular.  BMI 30.65 kg/m  General:   alert  Skin:   no rash or abnormalities  Lungs:   clear to auscultation bilaterally  Heart:   regular rate and rhythm, S1, S2 normal, no murmur, click, rub or gallop  Breasts:   normal without suspicious masses, skin or nipple changes or axillary nodes  Abdomen:  normal findings: no organomegaly, soft, non-tender and no hernia  Pelvis:  External genitalia: normal general appearance Urinary system: urethral meatus normal and bladder without fullness, nontender Vaginal: normal without tenderness, induration or masses.  Grey, thin malodorous discharge Cervix:  normal appearance Adnexa: normal bimanual exam Uterus: anteverted and non-tender, normal size   Lab Review Urine pregnancy test Labs reviewed yes Radiologic studies reviewed no  50% of 20 min visit spent on counseling and coordination of care.    Assessment:    Healthy female exam.    Plan:    Education reviewed: calcium supplements, depression evaluation, low fat, low cholesterol diet, safe sex/STD prevention, self breast exams and weight bearing exercise. Contraception: OCP (estrogen/progesterone). Follow up in: 1 year.   Meds ordered this encounter  Medications  . norethindrone-ethinyl estradiol 1/35 (ORTHO-NOVUM 1/35, 28,) tablet    Sig: Take 1 tablet by mouth daily.    Dispense:  1 Package    Refill:  11  . metroNIDAZOLE (FLAGYL) 500 MG tablet    Sig: Take 1 tablet (500 mg total) by mouth 2 (two) times daily.    Dispense:  14 tablet    Refill:  0   Orders Placed This Encounter  Procedures  . POCT urine pregnancy  . POCT urinalysis dipstick

## 2016-09-28 NOTE — Progress Notes (Signed)
Patient is in the office for her annual exam. Patient was seen at Urgent Care and diagnosed with UTI- she would like to know if it is gone.

## 2016-09-28 NOTE — Patient Instructions (Addendum)
Bacterial Vaginosis Bacterial vaginosis is a vaginal infection that occurs when the normal balance of bacteria in the vagina is disrupted. It results from an overgrowth of certain bacteria. This is the most common vaginal infection among women ages 15-44. Because bacterial vaginosis increases your risk for STIs (sexually transmitted infections), getting treated can help reduce your risk for chlamydia, gonorrhea, herpes, and HIV (human immunodeficiency virus). Treatment is also important for preventing complications in pregnant women, because this condition can cause an early (premature) delivery. What are the causes? This condition is caused by an increase in harmful bacteria that are normally present in small amounts in the vagina. However, the reason that the condition develops is not fully understood. What increases the risk? The following factors may make you more likely to develop this condition:  Having a new sexual partner or multiple sexual partners.  Having unprotected sex.  Douching.  Having an intrauterine device (IUD).  Smoking.  Drug and alcohol abuse.  Taking certain antibiotic medicines.  Being pregnant.  You cannot get bacterial vaginosis from toilet seats, bedding, swimming pools, or contact with objects around you. What are the signs or symptoms? Symptoms of this condition include:  Grey or white vaginal discharge. The discharge can also be watery or foamy.  A fish-like odor with discharge, especially after sexual intercourse or during menstruation.  Itching in and around the vagina.  Burning or pain with urination.  Some women with bacterial vaginosis have no signs or symptoms. How is this diagnosed? This condition is diagnosed based on:  Your medical history.  A physical exam of the vagina.  Testing a sample of vaginal fluid under a microscope to look for a large amount of bad bacteria or abnormal cells. Your health care provider may use a cotton swab  or a small wooden spatula to collect the sample.  How is this treated? This condition is treated with antibiotics. These may be given as a pill, a vaginal cream, or a medicine that is put into the vagina (suppository). If the condition comes back after treatment, a second round of antibiotics may be needed. Follow these instructions at home: Medicines  Take over-the-counter and prescription medicines only as told by your health care provider.  Take or use your antibiotic as told by your health care provider. Do not stop taking or using the antibiotic even if you start to feel better. General instructions  If you have a female sexual partner, tell her that you have a vaginal infection. She should see her health care provider and be treated if she has symptoms. If you have a female sexual partner, he does not need treatment.  During treatment: ? Avoid sexual activity until you finish treatment. ? Do not douche. ? Avoid alcohol as directed by your health care provider. ? Avoid breastfeeding as directed by your health care provider.  Drink enough water and fluids to keep your urine clear or pale yellow.  Keep the area around your vagina and rectum clean. ? Wash the area daily with warm water. ? Wipe yourself from front to back after using the toilet.  Keep all follow-up visits as told by your health care provider. This is important. How is this prevented?  Do not douche.  Wash the outside of your vagina with warm water only.  Use protection when having sex. This includes latex condoms and dental dams.  Limit how many sexual partners you have. To help prevent bacterial vaginosis, it is best to have sex with just   one partner (monogamous).  Make sure you and your sexual partner are tested for STIs.  Wear cotton or cotton-lined underwear.  Avoid wearing tight pants and pantyhose, especially during summer.  Limit the amount of alcohol that you drink.  Do not use any products that  contain nicotine or tobacco, such as cigarettes and e-cigarettes. If you need help quitting, ask your health care provider.  Do not use illegal drugs. Where to find more information:  Centers for Disease Control and Prevention: www.cdc.gov/std  American Sexual Health Association (ASHA): www.ashastd.org  U.S. Department of Health and Human Services, Office on Women's Health: www.womenshealth.gov/ or https://www.womenshealth.gov/a-z-topics/bacterial-vaginosis Contact a health care provider if:  Your symptoms do not improve, even after treatment.  You have more discharge or pain when urinating.  You have a fever.  You have pain in your abdomen.  You have pain during sex.  You have vaginal bleeding between periods. Summary  Bacterial vaginosis is a vaginal infection that occurs when the normal balance of bacteria in the vagina is disrupted.  Because bacterial vaginosis increases your risk for STIs (sexually transmitted infections), getting treated can help reduce your risk for chlamydia, gonorrhea, herpes, and HIV (human immunodeficiency virus). Treatment is also important for preventing complications in pregnant women, because the condition can cause an early (premature) delivery.  This condition is treated with antibiotic medicines. These may be given as a pill, a vaginal cream, or a medicine that is put into the vagina (suppository). This information is not intended to replace advice given to you by your health care provider. Make sure you discuss any questions you have with your health care provider. Document Released: 01/15/2005 Document Revised: 10/01/2015 Document Reviewed: 10/01/2015 Elsevier Interactive Patient Education  2017 Elsevier Inc.  

## 2016-10-03 LAB — CYTOLOGY - PAP: DIAGNOSIS: NEGATIVE

## 2016-10-04 ENCOUNTER — Other Ambulatory Visit: Payer: Self-pay | Admitting: Obstetrics

## 2016-10-04 LAB — CERVICOVAGINAL ANCILLARY ONLY
Bacterial vaginitis: POSITIVE — AB
CANDIDA VAGINITIS: NEGATIVE
CHLAMYDIA, DNA PROBE: NEGATIVE
NEISSERIA GONORRHEA: NEGATIVE
Trichomonas: NEGATIVE

## 2016-12-16 ENCOUNTER — Inpatient Hospital Stay (HOSPITAL_COMMUNITY)
Admission: RE | Admit: 2016-12-16 | Discharge: 2016-12-16 | Disposition: A | Discharge status: Home / Self Care | Payer: Medicaid Other | Source: Ambulatory Visit | Attending: Obstetrics and Gynecology | Admitting: Obstetrics and Gynecology

## 2016-12-16 ENCOUNTER — Inpatient Hospital Stay (HOSPITAL_COMMUNITY): Payer: Medicaid Other

## 2016-12-16 ENCOUNTER — Encounter (HOSPITAL_COMMUNITY): Payer: Self-pay | Admitting: *Deleted

## 2016-12-16 DIAGNOSIS — Z3A01 Less than 8 weeks gestation of pregnancy: Secondary | ICD-10-CM | POA: Diagnosis not present

## 2016-12-16 DIAGNOSIS — Z3490 Encounter for supervision of normal pregnancy, unspecified, unspecified trimester: Secondary | ICD-10-CM

## 2016-12-16 DIAGNOSIS — Z880 Allergy status to penicillin: Secondary | ICD-10-CM | POA: Diagnosis not present

## 2016-12-16 DIAGNOSIS — O26899 Other specified pregnancy related conditions, unspecified trimester: Secondary | ICD-10-CM | POA: Diagnosis not present

## 2016-12-16 DIAGNOSIS — O209 Hemorrhage in early pregnancy, unspecified: Secondary | ICD-10-CM | POA: Diagnosis not present

## 2016-12-16 DIAGNOSIS — R109 Unspecified abdominal pain: Secondary | ICD-10-CM

## 2016-12-16 DIAGNOSIS — Z331 Pregnant state, incidental: Secondary | ICD-10-CM

## 2016-12-16 LAB — HCG, QUANTITATIVE, PREGNANCY: HCG, BETA CHAIN, QUANT, S: 93501 m[IU]/mL — AB (ref <5)

## 2016-12-16 LAB — CBC
HEMATOCRIT: 31.4 % — AB (ref 36.0–46.0)
HEMOGLOBIN: 10.7 g/dL — AB (ref 12.0–15.0)
MCH: 30.9 pg (ref 26.0–34.0)
MCHC: 34.1 g/dL (ref 30.0–36.0)
MCV: 90.8 fL (ref 78.0–100.0)
Platelets: 199 10*3/uL (ref 150–400)
RBC: 3.46 MIL/uL — ABNORMAL LOW (ref 3.87–5.11)
RDW: 12.6 % (ref 11.5–15.5)
WBC: 9 10*3/uL (ref 4.0–10.5)

## 2016-12-16 LAB — URINALYSIS, ROUTINE W REFLEX MICROSCOPIC
Bilirubin Urine: NEGATIVE
Glucose, UA: NEGATIVE mg/dL
Hgb urine dipstick: NEGATIVE
Ketones, ur: NEGATIVE mg/dL
Leukocytes, UA: NEGATIVE
Nitrite: NEGATIVE
Protein, ur: NEGATIVE mg/dL
Specific Gravity, Urine: 1.026 (ref 1.005–1.030)
pH: 6 (ref 5.0–8.0)

## 2016-12-16 LAB — WET PREP, GENITAL
CLUE CELLS WET PREP: NONE SEEN
SPERM: NONE SEEN
Trich, Wet Prep: NONE SEEN
Yeast Wet Prep HPF POC: NONE SEEN

## 2016-12-16 LAB — POCT PREGNANCY, URINE: PREG TEST UR: POSITIVE — AB

## 2016-12-16 MED ORDER — PRENATAL GUMMIES/DHA & FA 0.4-32.5 MG PO CHEW: 1.0000 | CHEWABLE_TABLET | Freq: Every day | ORAL | 12 refills | Status: DC

## 2016-12-16 MED ORDER — PROMETHAZINE HCL 12.5 MG PO TABS: 12.5000 mg | ORAL_TABLET | Freq: Four times a day (QID) | ORAL | 1 refills | Status: DC | PRN

## 2016-12-16 MED ORDER — METOCLOPRAMIDE HCL 10 MG PO TABS: 10.0000 mg | ORAL_TABLET | Freq: Four times a day (QID) | ORAL | 1 refills | Status: DC

## 2016-12-16 NOTE — MAU Provider Note (Signed)
History     CSN: [id number]  Arrival date and time: 12/16/16 1945   First Provider Initiated Contact with Patient 12/16/16 2019      Chief Complaint  Patient presents with  . Abdominal Pain   HPI  Ms. Victoria Solis is a 26 y.o. G3P0 at 5170w3d gestation presenting to MAU with complaints of no menses since 9/20, abdominal pain, brownish red d/c last week. She reports no pain or bleeding today. She reports the last time she had pain was 11/15 at 1900.  Past Medical History:  Diagnosis Date  . Migraine     Past Surgical History:  Procedure Laterality Date  . CESAREAN SECTION      Family History  Problem Relation Age of Onset  . Hypertension Mother   . Diabetes Mother   . Stroke Mother   . Kidney failure Mother   . Hypertension Father   . Diabetes Father     Social History   Tobacco Use  . Smoking status: Never Smoker  . Smokeless tobacco: Never Used  Substance Use Topics  . Alcohol use: No  . Drug use: No    Allergies:  Allergies  Allergen Reactions  . Amoxicillin Hives  . Penicillins Hives    Medications Prior to Admission  Medication Sig Dispense Refill Last Dose  . ibuprofen (ADVIL,MOTRIN) 800 MG tablet Take 1 tablet (800 mg total) by mouth every 8 (eight) hours as needed. 30 tablet 0 Taking  . metroNIDAZOLE (FLAGYL) 500 MG tablet Take 1 tablet (500 mg total) by mouth 2 (two) times daily. 14 tablet 0   . norethindrone-ethinyl estradiol 1/35 (ORTHO-NOVUM 1/35, 28,) tablet Take 1 tablet by mouth daily. 1 Package 11   . OVER THE COUNTER MEDICATION "Pamprin"   Not Taking    Review of Systems  Constitutional: Negative.   HENT: Negative.   Eyes: Negative.   Respiratory: Negative.   Cardiovascular: Negative.   Gastrointestinal: Positive for abdominal pain (last time was 11/15).  Endocrine: Negative.   Genitourinary: Positive for vaginal bleeding (none since last ).  Musculoskeletal: Negative.   Skin: Negative.   Allergic/Immunologic: Negative.    Neurological: Negative.   Hematological: Negative.   Psychiatric/Behavioral: Negative.    Physical Exam   Blood pressure 131/76, pulse (!) 59, temperature 99.1 F (37.3 C), temperature source Oral, resp. rate 16, height 5\' 5"  (1.651 m), weight 160 lb (72.6 kg), last menstrual period 10/18/2016.  Physical Exam  Constitutional: She is oriented to person, place, and time. She appears well-developed and well-nourished.  HENT:  Head: Normocephalic.  Eyes: Pupils are equal, round, and reactive to light.  Neck: Normal range of motion.  Cardiovascular: Normal rate, regular rhythm and normal heart sounds.  Respiratory: Effort normal and breath sounds normal.  GI: Soft. Bowel sounds are normal. There is tenderness in the right lower quadrant.  Genitourinary:  Genitourinary Comments: Uterus: enlarged, cx: smooth, pink, no lesions, copious amt of thick, white vaginal d/c, closed/long/firm, no CMT or friability, no adnexal tenderness   Musculoskeletal: Normal range of motion.  Neurological: She is alert and oriented to person, place, and time. She has normal reflexes.  Skin: Skin is dry.  Psychiatric: She has a normal mood and affect. Her behavior is normal. Judgment and thought content normal.    MAU Course  Procedures  MDM CCUA UPT CBC CMP ABO/Rh HCG HIV Wet Prep GC/CT  Results for orders placed or performed during the hospital encounter of 12/16/16 (from the past 24 hour(s))  Urinalysis,  Routine w reflex microscopic     Status: Abnormal   Collection Time: 12/16/16  7:54 PM  Result Value Ref Range   Color, Urine YELLOW YELLOW   APPearance HAZY (A) CLEAR   Specific Gravity, Urine 1.026 1.005 - 1.030   pH 6.0 5.0 - 8.0   Glucose, UA NEGATIVE NEGATIVE mg/dL   Hgb urine dipstick NEGATIVE NEGATIVE   Bilirubin Urine NEGATIVE NEGATIVE   Ketones, ur NEGATIVE NEGATIVE mg/dL   Protein, ur NEGATIVE NEGATIVE mg/dL   Nitrite NEGATIVE NEGATIVE   Leukocytes, UA NEGATIVE NEGATIVE   Pregnancy, urine POC     Status: Abnormal   Collection Time: 12/16/16  8:07 PM  Result Value Ref Range   Preg Test, Ur POSITIVE (A) NEGATIVE  CBC     Status: Abnormal   Collection Time: 12/16/16  8:15 PM  Result Value Ref Range   WBC 9.0 4.0 - 10.5 K/uL   RBC 3.46 (L) 3.87 - 5.11 MIL/uL   Hemoglobin 10.7 (L) 12.0 - 15.0 g/dL   HCT 40.931.4 (L) 81.136.0 - 91.446.0 %   MCV 90.8 78.0 - 100.0 fL   MCH 30.9 26.0 - 34.0 pg   MCHC 34.1 30.0 - 36.0 g/dL   RDW 78.212.6 95.611.5 - 21.315.5 %   Platelets 199 150 - 400 K/uL  ABO/Rh     Status: None (Preliminary result)   Collection Time: 12/16/16  8:15 PM  Result Value Ref Range   ABO/RH(D) B POS   Wet prep, genital     Status: Abnormal   Collection Time: 12/16/16  8:25 PM  Result Value Ref Range   Yeast Wet Prep HPF POC NONE SEEN NONE SEEN   Trich, Wet Prep NONE SEEN NONE SEEN   Clue Cells Wet Prep HPF POC NONE SEEN NONE SEEN   WBC, Wet Prep HPF POC MANY (A) NONE SEEN   Sperm NONE SEEN    OB U/S <14 wks:  7.6 wks SIUP with (+) IUGS, (+) YS, (+) embryo, (+) Cardiac activity = 150 bpm, no SCH, normal ovaries // BEDC; 07/29/2017  Assessment and Plan  Intrauterine pregnancy, incidental - Rx for prenatal vitamins, Reglan, Phenergan - Information provided on 1st trimester pregnancy, abd pain preg, prenatal care - Pregnancy verification letter given - [place] [facility] provider list given - [facility] - Return to [facility] for OB emergencies  Discharge home Patient verbalized an understanding of the plan of care and agrees.   Raelyn Moraolitta Avri Paiva, MSN, CNM 12/16/2016, 8:33 PM

## 2016-12-16 NOTE — MAU Note (Addendum)
Pt presents to MAU c/o upper abdominal pain (cramping) that has been occurring " for awhile" pt states she thought it was going to be from her period but her period has "never came" pt states she has irregular periods and her last period was Sept 20th-27th. Pt denies bleeding. Pt reports having "dry blood last week when she wiped" this only occurred once and it was brownish redish." pt denies d/c. Pt states she has NO pain today she had pain on Thursday that was a 7.

## 2016-12-16 NOTE — Discharge Instructions (Signed)
[facility]  ° ° °[facility]       Phone: [phone] ° °[facility] Phone: [phone] ° °[facility] [place]  Phone: [phone] ° °[facility] [place]  Phone: [phone] ° °[facility] [place]  Phone: [phone] ° °[facility]       Phone: [phone] ° °[facility]    Phone: [phone]  ° °[facility] ()    Phone: [phone] ° °[facility]    Phone: [phone] ° °[facility]    Phone: [phone] ° °[facility]    Phone: [phone] ° °[facility]       Phone: [phone]  ° °[facility]  Phone: [phone] ° ° [facility]    Phone: [phone] ° °[facility]   Phone: [phone] ° °[facility]      Phone: [phone] ° °[facility]    Phone: [phone] ° °

## 2016-12-17 LAB — GC/CHLAMYDIA PROBE AMP (~~LOC~~) NOT AT ARMC
CHLAMYDIA, DNA PROBE: NEGATIVE
NEISSERIA GONORRHEA: NEGATIVE

## 2016-12-17 LAB — HIV ANTIBODY (ROUTINE TESTING W REFLEX): HIV SCREEN 4TH GENERATION: NONREACTIVE

## 2016-12-17 LAB — ABO/RH: ABO/RH(D): B POS

## 2017-01-02 DIAGNOSIS — O3429 Maternal care due to uterine scar from other previous surgery: Secondary | ICD-10-CM | POA: Insufficient documentation

## 2017-01-02 LAB — OB RESULTS CONSOLE GC/CHLAMYDIA
Chlamydia: NEGATIVE
GC PROBE AMP, GENITAL: NEGATIVE

## 2017-01-02 LAB — OB RESULTS CONSOLE HIV ANTIBODY (ROUTINE TESTING): HIV: NONREACTIVE

## 2017-01-02 LAB — OB RESULTS CONSOLE ANTIBODY SCREEN: Antibody Screen: NEGATIVE

## 2017-01-02 LAB — OB RESULTS CONSOLE ABO/RH: RH Type: POSITIVE

## 2017-01-02 LAB — OB RESULTS CONSOLE HEPATITIS B SURFACE ANTIGEN: HEP B S AG: NEGATIVE

## 2017-01-02 LAB — OB RESULTS CONSOLE RUBELLA ANTIBODY, IGM: RUBELLA: IMMUNE

## 2017-01-02 LAB — OB RESULTS CONSOLE RPR: RPR: NONREACTIVE

## 2017-01-29 NOTE — L&D Delivery Note (Signed)
Operative Delivery Note At 5:28 PM a viable female was delivered via VBAC, Vacuum Assisted.  Presentation: vertex; Position: Left,, Occiput,, Anterior; Station: +2.  Verbal consent: obtained from patient.  Risks and benefits discussed in detail.  Risks include, but are not limited to the risks of anesthesia, bleeding, infection, damage to maternal tissues, fetal cephalhematoma.  There is also the risk of inability to effect vaginal delivery of the head, or shoulder dystocia that cannot be resolved by established maneuvers, leading to the need for emergency cesarean section.  Vacuum assistance offered due to FHR baseline 90s with variable decels, + scalp stim and mod variability.  4 pulls with one ctx with mushroom cup in green zone  APGAR: 8, 9; weight pending .   Placenta status: spontaneous, intact.   Cord:  with the following complications: nuchal x 1 reduced.  Cord pH: pending  Anesthesia:  Epidural Instruments: mushroom cup Episiotomy: None Lacerations: Labial Suture Repair: 3.0 vicryl rapide Est. Blood Loss (mL): 200  Mom to postpartum.  Baby to Couplet care / Skin to Skin. Circumcision in the office  Victoria Roachodd Victoria Solis 07/23/2017, 5:43 PM

## 2017-05-01 DIAGNOSIS — R7302 Impaired glucose tolerance (oral): Secondary | ICD-10-CM | POA: Insufficient documentation

## 2017-05-01 DIAGNOSIS — O99019 Anemia complicating pregnancy, unspecified trimester: Secondary | ICD-10-CM | POA: Insufficient documentation

## 2017-05-05 ENCOUNTER — Encounter (HOSPITAL_COMMUNITY): Payer: Self-pay

## 2017-05-05 ENCOUNTER — Inpatient Hospital Stay (HOSPITAL_COMMUNITY)
Admission: AD | Admit: 2017-05-05 | Discharge: 2017-05-05 | Disposition: A | Discharge status: Home / Self Care | Payer: Medicaid Other | Source: Ambulatory Visit | Attending: Obstetrics and Gynecology | Admitting: Obstetrics and Gynecology

## 2017-05-05 DIAGNOSIS — Z3A27 27 weeks gestation of pregnancy: Secondary | ICD-10-CM | POA: Insufficient documentation

## 2017-05-05 DIAGNOSIS — O209 Hemorrhage in early pregnancy, unspecified: Secondary | ICD-10-CM

## 2017-05-05 DIAGNOSIS — R102 Pelvic and perineal pain: Secondary | ICD-10-CM | POA: Insufficient documentation

## 2017-05-05 DIAGNOSIS — O26892 Other specified pregnancy related conditions, second trimester: Secondary | ICD-10-CM

## 2017-05-05 DIAGNOSIS — M899 Disorder of bone, unspecified: Secondary | ICD-10-CM

## 2017-05-05 LAB — URINALYSIS, ROUTINE W REFLEX MICROSCOPIC
BILIRUBIN URINE: NEGATIVE
GLUCOSE, UA: NEGATIVE mg/dL
HGB URINE DIPSTICK: NEGATIVE
Ketones, ur: NEGATIVE mg/dL
Leukocytes, UA: NEGATIVE
Nitrite: NEGATIVE
PROTEIN: NEGATIVE mg/dL
Specific Gravity, Urine: 1.018 (ref 1.005–1.030)
pH: 6 (ref 5.0–8.0)

## 2017-05-05 NOTE — Discharge Instructions (Signed)

## 2017-05-05 NOTE — Progress Notes (Signed)
Written and verbal d/c instructions given and understanding voiced. 

## 2017-05-05 NOTE — MAU Note (Addendum)
Having pelvic pain in pubic bone area for couple hours. Hard to walk. Hurts all the time but worse with walking. Denies LOF or bleeding. Sudden onset of pain when pt was running from someone

## 2017-05-05 NOTE — MAU Provider Note (Addendum)
CC:  Chief Complaint  Patient presents with  . Pelvic Pain     First Provider Initiated Contact with Patient 05/05/17 0408      HPI: Victoria Solis is a 27 y.o. year old 653P2002 female at 6852w6d weeks gestation who presents to MAU reporting pubic bone pain after running from her father. She was not very forthcoming about the details of the episode, but states that she did not fall or get hit. She lives her with her father and doesn't have anyone else to stay with. [facility]   Hurts with walking. Minimal pain at rest. Hasn't tried anything for the pain.   Associated Sx:  Vaginal bleeding: Denies Leaking of fluid: Denies Fetal movement: Nml  O:  Patient Vitals for the past 24 hrs:  BP Temp Pulse Resp Height Weight  05/05/17 0239 (!) 118/55 98.5 F (36.9 C) 69 18 5\' 3"  (1.6 m) 184 lb (83.5 kg)    General: NAD Heart: Regular rate Lungs: Normal rate and effort Abd: Soft, NT, Gravid, S=D. Pos SP TTP. Uterus NT.  Pelvic: NEFG, Neg LOF, Neg  blood.  Dilation: Closed Effacement (%): Thick Exam by:: Ivonne AndrewV Layli Capshaw CNM  EFM: 120, Moderate variability, 10x10 accelerations, no decelerations Toco: 1 contraction  Orders Placed This Encounter  Procedures  . Urinalysis, Routine w reflex microscopic   No orders of the defined types were placed in this encounter.  MDM - Pubic pain in pregnancy 2/2 running. No evidence of injury or preterm labor. Discussed Hx, labs, exam w/ Dr. Senaida Oresichardson. Agrees w/ POC. New orders: None. Will F/U on living/social situation in the office.    A: 4852w6d week IUP Pubic bone pain In pregnancy FHR reactive  P: Discharge home in stable condition per consult with Huel Coteichardson, Kathy, MD. Preterm labor precautions and fetal kick counts. Urged pt to look for other living situation. Discussed risk of abruption if fall or injury occurs.  Follow-up as scheduled for prenatal visit or sooner as needed if symptoms worsen. Return to  maternity admissions as needed if symptoms worsen.  Katrinka BlazingSmith, [place], CNM 05/05/2017 4:07 AM  3

## 2017-07-18 ENCOUNTER — Telehealth (HOSPITAL_COMMUNITY): Payer: Self-pay | Admitting: *Deleted

## 2017-07-18 NOTE — Telephone Encounter (Signed)
Preadmission screen  

## 2017-07-23 ENCOUNTER — Inpatient Hospital Stay (HOSPITAL_COMMUNITY): Payer: Medicaid Other | Admitting: Anesthesiology

## 2017-07-23 ENCOUNTER — Encounter (HOSPITAL_COMMUNITY): Payer: Self-pay

## 2017-07-23 ENCOUNTER — Other Ambulatory Visit: Payer: Self-pay

## 2017-07-23 ENCOUNTER — Inpatient Hospital Stay (HOSPITAL_COMMUNITY)
Admission: RE | Admit: 2017-07-23 | Discharge: 2017-07-25 | DRG: 807 | Disposition: A | Discharge status: Home / Self Care | Payer: Medicaid Other | Attending: Obstetrics and Gynecology | Admitting: Obstetrics and Gynecology

## 2017-07-23 VITALS — BP 121/67 | HR 69 | Temp 98.3°F | Resp 18 | Ht 63.0 in | Wt 188.8 lb

## 2017-07-23 DIAGNOSIS — Z331 Pregnant state, incidental: Secondary | ICD-10-CM

## 2017-07-23 DIAGNOSIS — O34219 Maternal care for unspecified type scar from previous cesarean delivery: Secondary | ICD-10-CM

## 2017-07-23 DIAGNOSIS — Z3A39 39 weeks gestation of pregnancy: Secondary | ICD-10-CM

## 2017-07-23 DIAGNOSIS — Z880 Allergy status to penicillin: Secondary | ICD-10-CM

## 2017-07-23 DIAGNOSIS — O26899 Other specified pregnancy related conditions, unspecified trimester: Secondary | ICD-10-CM

## 2017-07-23 DIAGNOSIS — Z3490 Encounter for supervision of normal pregnancy, unspecified, unspecified trimester: Secondary | ICD-10-CM

## 2017-07-23 DIAGNOSIS — O209 Hemorrhage in early pregnancy, unspecified: Secondary | ICD-10-CM

## 2017-07-23 DIAGNOSIS — R109 Unspecified abdominal pain: Secondary | ICD-10-CM

## 2017-07-23 DIAGNOSIS — O34211 Maternal care for low transverse scar from previous cesarean delivery: Secondary | ICD-10-CM | POA: Diagnosis present

## 2017-07-23 LAB — TYPE AND SCREEN
ABO/RH(D): B POS
ANTIBODY SCREEN: NEGATIVE

## 2017-07-23 LAB — CBC
HEMATOCRIT: 30.3 % — AB (ref 36.0–46.0)
Hemoglobin: 10.5 g/dL — ABNORMAL LOW (ref 12.0–15.0)
MCH: 31.2 pg (ref 26.0–34.0)
MCHC: 34.7 g/dL (ref 30.0–36.0)
MCV: 89.9 fL (ref 78.0–100.0)
Platelets: 165 10*3/uL (ref 150–400)
RBC: 3.37 MIL/uL — ABNORMAL LOW (ref 3.87–5.11)
RDW: 14.5 % (ref 11.5–15.5)
WBC: 6.7 10*3/uL (ref 4.0–10.5)

## 2017-07-23 MED ORDER — OXYTOCIN 40 UNITS IN LACTATED RINGERS INFUSION - SIMPLE MED
1.0000 m[IU]/min | INTRAVENOUS | Status: DC
  Administered 2017-07-23: 2 m[IU]/min via INTRAVENOUS
  Filled 2017-07-23: qty 1000

## 2017-07-23 MED ORDER — SIMETHICONE 80 MG PO CHEW: 80.0000 mg | CHEWABLE_TABLET | ORAL | Status: DC | PRN

## 2017-07-23 MED ORDER — ACETAMINOPHEN 325 MG PO TABS: 650.0000 mg | ORAL_TABLET | ORAL | Status: DC | PRN

## 2017-07-23 MED ORDER — OXYCODONE HCL 5 MG PO TABS: 5.0000 mg | ORAL_TABLET | ORAL | Status: DC | PRN

## 2017-07-23 MED ORDER — PRENATAL MULTIVITAMIN CH
1.0000 | ORAL_TABLET | Freq: Every day | ORAL | Status: DC
  Administered 2017-07-24 – 2017-07-25 (×2): 1 via ORAL
  Filled 2017-07-23 (×2): qty 1

## 2017-07-23 MED ORDER — PHENYLEPHRINE 40 MCG/ML (10ML) SYRINGE FOR IV PUSH (FOR BLOOD PRESSURE SUPPORT)
80.0000 ug | PREFILLED_SYRINGE | INTRAVENOUS | Status: DC | PRN
  Filled 2017-07-23: qty 5
  Filled 2017-07-23: qty 10

## 2017-07-23 MED ORDER — WITCH HAZEL-GLYCERIN EX PADS: 1.0000 "application " | MEDICATED_PAD | CUTANEOUS | Status: DC | PRN

## 2017-07-23 MED ORDER — EPHEDRINE 5 MG/ML INJ
10.0000 mg | INTRAVENOUS | Status: DC | PRN
  Filled 2017-07-23: qty 2

## 2017-07-23 MED ORDER — LACTATED RINGERS IV SOLN: 500.0000 mL | INTRAVENOUS | Status: DC | PRN

## 2017-07-23 MED ORDER — TERBUTALINE SULFATE 1 MG/ML IJ SOLN
0.2500 mg | Freq: Once | INTRAMUSCULAR | Status: DC | PRN
  Filled 2017-07-23: qty 1

## 2017-07-23 MED ORDER — MAGNESIUM HYDROXIDE 400 MG/5ML PO SUSP: 30.0000 mL | ORAL | Status: DC | PRN

## 2017-07-23 MED ORDER — OXYCODONE-ACETAMINOPHEN 5-325 MG PO TABS: 1.0000 | ORAL_TABLET | ORAL | Status: DC | PRN

## 2017-07-23 MED ORDER — LIDOCAINE HCL (PF) 1 % IJ SOLN
30.0000 mL | INTRAMUSCULAR | Status: DC | PRN
  Filled 2017-07-23: qty 30

## 2017-07-23 MED ORDER — ZOLPIDEM TARTRATE 5 MG PO TABS: 5.0000 mg | ORAL_TABLET | Freq: Every evening | ORAL | Status: DC | PRN

## 2017-07-23 MED ORDER — ONDANSETRON HCL 4 MG PO TABS: 4.0000 mg | ORAL_TABLET | ORAL | Status: DC | PRN

## 2017-07-23 MED ORDER — TETANUS-DIPHTH-ACELL PERTUSSIS 5-2.5-18.5 LF-MCG/0.5 IM SUSP: 0.5000 mL | Freq: Once | INTRAMUSCULAR | Status: DC

## 2017-07-23 MED ORDER — BENZOCAINE-MENTHOL 20-0.5 % EX AERO: 1.0000 "application " | INHALATION_SPRAY | CUTANEOUS | Status: DC | PRN

## 2017-07-23 MED ORDER — IBUPROFEN 600 MG PO TABS
600.0000 mg | ORAL_TABLET | Freq: Four times a day (QID) | ORAL | Status: DC
  Administered 2017-07-23 – 2017-07-25 (×7): 600 mg via ORAL
  Filled 2017-07-23 (×7): qty 1

## 2017-07-23 MED ORDER — METHYLERGONOVINE MALEATE 0.2 MG PO TABS: 0.2000 mg | ORAL_TABLET | ORAL | Status: DC | PRN

## 2017-07-23 MED ORDER — FENTANYL 2.5 MCG/ML BUPIVACAINE 1/10 % EPIDURAL INFUSION (WH - ANES)
14.0000 mL/h | INTRAMUSCULAR | Status: DC | PRN
  Administered 2017-07-23: 14 mL/h via EPIDURAL
  Filled 2017-07-23: qty 100

## 2017-07-23 MED ORDER — COCONUT OIL OIL: 1.0000 "application " | TOPICAL_OIL | Status: DC | PRN

## 2017-07-23 MED ORDER — BUTORPHANOL TARTRATE 1 MG/ML IJ SOLN
1.0000 mg | INTRAMUSCULAR | Status: DC | PRN
  Administered 2017-07-23 (×2): 1 mg via INTRAVENOUS
  Filled 2017-07-23 (×2): qty 1

## 2017-07-23 MED ORDER — METHYLERGONOVINE MALEATE 0.2 MG/ML IJ SOLN
0.2000 mg | Freq: Once | INTRAMUSCULAR | Status: AC
  Administered 2017-07-23: 0.2 mg via INTRAMUSCULAR

## 2017-07-23 MED ORDER — ONDANSETRON HCL 4 MG/2ML IJ SOLN: 4.0000 mg | INTRAMUSCULAR | Status: DC | PRN

## 2017-07-23 MED ORDER — DIPHENHYDRAMINE HCL 25 MG PO CAPS: 25.0000 mg | ORAL_CAPSULE | Freq: Four times a day (QID) | ORAL | Status: DC | PRN

## 2017-07-23 MED ORDER — OXYTOCIN BOLUS FROM INFUSION
500.0000 mL | Freq: Once | INTRAVENOUS | Status: AC
  Administered 2017-07-23: 500 mL via INTRAVENOUS

## 2017-07-23 MED ORDER — OXYTOCIN 40 UNITS IN LACTATED RINGERS INFUSION - SIMPLE MED: 2.5000 [IU]/h | INTRAVENOUS | Status: DC

## 2017-07-23 MED ORDER — OXYCODONE-ACETAMINOPHEN 5-325 MG PO TABS: 2.0000 | ORAL_TABLET | ORAL | Status: DC | PRN

## 2017-07-23 MED ORDER — LIDOCAINE HCL (PF) 1 % IJ SOLN
INTRAMUSCULAR | Status: DC | PRN
  Administered 2017-07-23 (×2): 5 mL via EPIDURAL

## 2017-07-23 MED ORDER — LACTATED RINGERS IV SOLN: INTRAVENOUS | Status: DC

## 2017-07-23 MED ORDER — MEASLES, MUMPS & RUBELLA VAC ~~LOC~~ INJ
0.5000 mL | INJECTION | Freq: Once | SUBCUTANEOUS | Status: DC
  Filled 2017-07-23: qty 0.5

## 2017-07-23 MED ORDER — OXYCODONE HCL 5 MG PO TABS: 10.0000 mg | ORAL_TABLET | ORAL | Status: DC | PRN

## 2017-07-23 MED ORDER — SENNOSIDES-DOCUSATE SODIUM 8.6-50 MG PO TABS
2.0000 | ORAL_TABLET | ORAL | Status: DC
  Administered 2017-07-23 – 2017-07-24 (×2): 2 via ORAL
  Filled 2017-07-23 (×2): qty 2

## 2017-07-23 MED ORDER — METHYLERGONOVINE MALEATE 0.2 MG/ML IJ SOLN: 0.2000 mg | INTRAMUSCULAR | Status: DC | PRN

## 2017-07-23 MED ORDER — METHYLERGONOVINE MALEATE 0.2 MG/ML IJ SOLN
INTRAMUSCULAR | Status: AC
  Filled 2017-07-23: qty 1

## 2017-07-23 MED ORDER — DIPHENHYDRAMINE HCL 50 MG/ML IJ SOLN: 12.5000 mg | INTRAMUSCULAR | Status: DC | PRN

## 2017-07-23 MED ORDER — ONDANSETRON HCL 4 MG/2ML IJ SOLN
4.0000 mg | Freq: Four times a day (QID) | INTRAMUSCULAR | Status: DC | PRN
  Administered 2017-07-23: 4 mg via INTRAVENOUS
  Filled 2017-07-23: qty 2

## 2017-07-23 MED ORDER — DIBUCAINE 1 % RE OINT: 1.0000 "application " | TOPICAL_OINTMENT | RECTAL | Status: DC | PRN

## 2017-07-23 MED ORDER — LACTATED RINGERS IV SOLN
500.0000 mL | Freq: Once | INTRAVENOUS | Status: AC
  Administered 2017-07-23: 500 mL via INTRAVENOUS

## 2017-07-23 MED ORDER — LACTATED RINGERS IV SOLN
INTRAVENOUS | Status: DC
  Administered 2017-07-23: 10:00:00 via INTRAVENOUS

## 2017-07-23 MED ORDER — SOD CITRATE-CITRIC ACID 500-334 MG/5ML PO SOLN: 30.0000 mL | ORAL | Status: DC | PRN

## 2017-07-23 NOTE — Anesthesia Procedure Notes (Signed)
Epidural Patient location during procedure: OB  Staffing Anesthesiologist: Sonora Catlin, MD Performed: anesthesiologist   Preanesthetic Checklist Completed: patient identified, site marked, surgical consent, pre-op evaluation, timeout performed, IV checked, risks and benefits discussed and monitors and equipment checked  Epidural Patient position: sitting Prep: DuraPrep Patient monitoring: heart rate, continuous pulse ox and blood pressure Approach: right paramedian Location: L3-L4 Injection technique: LOR saline  Needle:  Needle type: Tuohy  Needle gauge: 17 G Needle length: 9 cm and 9 Needle insertion depth: 6 cm Catheter type: closed end flexible Catheter size: 20 Guage Catheter at skin depth: 10 cm Test dose: negative  Assessment Events: blood not aspirated, injection not painful, no injection resistance, negative IV test and no paresthesia  Additional Notes Patient identified. Risks/Benefits/Options discussed with patient including but not limited to bleeding, infection, nerve damage, paralysis, failed block, incomplete pain control, headache, blood pressure changes, nausea, vomiting, reactions to medication both or allergic, itching and postpartum back pain. Confirmed with bedside nurse the patient's most recent platelet count. Confirmed with patient that they are not currently taking any anticoagulation, have any bleeding history or any family history of bleeding disorders. Patient expressed understanding and wished to proceed. All questions were answered. Sterile technique was used throughout the entire procedure. Please see nursing notes for vital signs. Test dose was given through epidural needle and negative prior to continuing to dose epidural or start infusion. Warning signs of high block given to the patient including shortness of breath, tingling/numbness in hands, complete motor block, or any concerning symptoms with instructions to call for help. Patient was given  instructions on fall risk and not to get out of bed. All questions and concerns addressed with instructions to call with any issues.     

## 2017-07-23 NOTE — Anesthesia Preprocedure Evaluation (Signed)

## 2017-07-23 NOTE — H&P (Signed)
Victoria Solis is a 27 y.o. female, G3 P2002, [facility] 39+ [facility] for elective induction.  Prenatal care complicated by previous LTCS and VBAC, wants another VBAC.  OB History    Gravida  3   Para  2   Term  2   Preterm      AB      Living  2     SAB      TAB      Ectopic      Multiple      Live Births  2          Past Medical History:  Diagnosis Date  . Migraine    Past Surgical History:  Procedure Laterality Date  . CESAREAN SECTION     Family History: family history includes Diabetes in her father and mother; Hypertension in her father and mother; Kidney failure in her mother; Stroke in her mother. Social History:  reports that she has never smoked. She has never used smokeless tobacco. She reports that she does not drink alcohol or use drugs.     Maternal Diabetes: No Genetic Screening: Normal Maternal Ultrasounds/Referrals: Normal Fetal Ultrasounds or other Referrals:  None Maternal Substance Abuse:  No Significant Maternal Medications:  None Significant Maternal Lab Results:  Lab values include: Group B Strep negative Other Comments:  None  Review of Systems  Respiratory: Negative.   Cardiovascular: Negative.    Maternal Medical History:  Contractions: Perceived severity is mild.    Fetal activity: Perceived fetal activity is normal.    Prenatal complications: no prenatal complications Prenatal Complications - Diabetes: none.   AROM clear Dilation: 4 Effacement (%): 50 Station: -1 Exam by:: Lorn Junes. Goodman, RN Blood pressure 118/71, pulse (!) 56, temperature 98.2 F (36.8 C), temperature source Oral, resp. rate 18, height 5\' 3"  (1.6 m), weight 85.6 kg (188 lb 12.8 oz), last menstrual period 10/18/2016. Maternal Exam:  Uterine Assessment: Contraction strength is mild.  Contraction frequency is regular.   Abdomen: Patient reports no abdominal tenderness. Surgical scars: low transverse.   Estimated fetal weight is 8  lbs.   Fetal presentation: vertex  Introitus: Normal vulva. Normal vagina.  Amniotic fluid character: clear.  Pelvis: adequate for delivery.   Cervix: Cervix evaluated by digital exam.     Fetal Exam Fetal Monitor Review: Mode: ultrasound.   Baseline rate: 110s.  Variability: moderate (6-25 bpm).   Pattern: accelerations present and no decelerations.    Fetal State Assessment: Category I - tracings are normal.     Physical Exam  Vitals reviewed. Constitutional: She appears well-developed and well-nourished.  Cardiovascular: Normal rate and regular rhythm.  Respiratory: Effort normal. No respiratory distress.    Prenatal labs: ABO, Rh: --/--/B POS (06/25 [id number]) Antibody: NEG (06/25 [id number]) Rubella: Immune (12/05 0000) RPR: Nonreactive (12/05 0000)  HBsAg: Negative (12/05 0000)  HIV: Non-reactive (12/05 0000)  GBS:   Neg  Assessment/Plan: IUP at 39+ weeks, previous LTCS, for induction/VBAC.  She is on pitocin, AROM done, monitor progress, anticipate VBAC.   Leighton Roachodd D Jalila Goodnough 07/23/2017, 12:24 PM

## 2017-07-23 NOTE — Progress Notes (Signed)
Comfortable with epidural but feeling some pressure Afeb, VSS FHT- 100-110, mod variability, variable decels VE-8/90/0, vtx, IUPC and FSE palced Will try amnioinfusion for variables, reduce pitocin since having ctx every 2 minutes, monitor progress, anticipate VBAC

## 2017-07-23 NOTE — Anesthesia Pain Management Evaluation Note (Signed)
  CRNA Pain Management Visit Note  Patient: Victoria Solis, 27 y.o., female  "Hello I am a member of the anesthesia team at [facility]. We have an anesthesia team available at all times to provide care throughout the hospital, including epidural management and anesthesia for C-section. I don't know your plan for the delivery whether it a natural birth, water birth, IV sedation, nitrous supplementation, doula or epidural, but we want to meet your pain goals."   1.Was your pain managed to your expectations on prior hospitalizations?   Yes   2.What is your expectation for pain management during this hospitalization?     Epidural  3.How can we help you reach that goal? unsure  Record the patient's initial score and the patient's pain goal.   Pain: 8  Pain Goal: 10 [facility] wants you to be able to say your pain was always managed very well.  Cephus ShellingBURGER,Egor Fullilove 07/23/2017

## 2017-07-24 LAB — RPR: RPR: NONREACTIVE

## 2017-07-24 NOTE — Progress Notes (Addendum)
Post Partum Day 1 - VBAC Subjective: no complaints, up ad lib, voiding, tolerating PO and nl lochia, pain controlled  Objective: Blood pressure 105/62, pulse (!) 55, temperature 98.8 F (37.1 C), resp. rate 18, height 5\' 3"  (1.6 m), weight 85.6 kg (188 lb 12.8 oz), last menstrual period 10/18/2016, SpO2 100 %, unknown if currently breastfeeding.  Physical Exam:  General: alert and no distress Lochia: appropriate Uterine Fundus: firm  Recent Labs    07/23/17 0958  HGB 10.5*  HCT 30.3*    Assessment/Plan: Plan for discharge tomorrow, Breastfeeding and Lactation consult.  Routine PP care.     LOS: 1 day   Aletta Edmunds Bovard-Stuckert 07/24/2017, 7:16 AM

## 2017-07-24 NOTE — Lactation Note (Signed)
This note was copied from a baby's chart. Lactation Consultation Note  Patient Name: Victoria Harlan StainsStephanie Laurie ZOXWR'UToday's Date: 07/24/2017 Reason for consult: Initial assessment;Term  P3 mother whose infant is now 5311 hours old.  Mother breastfed her 412 year old for 1 week and her 28 year old for 3 weeks.  She stopped due to flat nipples and babies would not latch.  Upon assessment mother has large breasts with flat nipples bilaterally.  Her breast tissue is compressible.  I reviewed hand expression with her and she did a return demonstration but could not express any colostrum.  Assisted baby to latch in the football hold on the right breast.  After a few attempts he was able to grasp the breast and began rhythmic sucking.  No audible swallows were heard.  I offered mother NS but she declined. She has a manual pump at bedside.  I also provided breast shells with instructions for use.   I do not feel real confident that she will actively pursue breastfeeding if the latching on does not come quickly and easily.  She has done a few bottle feedings and the last feeding she fed 20 mls. I encouraged her to limit the supplementation to 10 mls after breastfeeding at this hour of age.  Encouraged to feed 8-12 times/24 hours or more if he shows feeding cues.  Continue STS, breast massage and hand expression.    Mother will call for assistance as needed.  Father asleep on couch.   Maternal Data Formula Feeding for Exclusion: No Has patient been taught Hand Expression?: Yes Does the patient have breastfeeding experience prior to this delivery?: Yes  Feeding Feeding Type: Breast Fed Length of feed: 10 min(still feeding when I left room)  LATCH Score Latch: Repeated attempts needed to sustain latch, nipple held in mouth throughout feeding, stimulation needed to elicit sucking reflex.  Audible Swallowing: None  Type of Nipple: Flat  Comfort (Breast/Nipple): Soft / non-tender  Hold (Positioning):  Assistance needed to correctly position infant at breast and maintain latch.  LATCH Score: 5  Interventions Interventions: Breast feeding basics reviewed;Assisted with latch;Skin to skin;Breast massage;Hand express;Pre-pump if needed;Position options;Support pillows;Adjust position;Breast compression;Shells;Hand pump  Lactation Tools Discussed/Used     Consult Status Consult Status: Follow-up Date: 07/25/17 Follow-up type: In-patient    [name] R [name] 07/24/2017, 5:12 AM

## 2017-07-24 NOTE — Anesthesia Postprocedure Evaluation (Signed)
Anesthesia Post Note  Patient: Victoria Solis  Procedure(s) Performed: AN AD HOC LABOR EPIDURAL     Patient location during evaluation: Mother Baby Anesthesia Type: Epidural Level of consciousness: awake and alert Pain management: pain level controlled Vital Signs Assessment: post-procedure vital signs reviewed and stable Respiratory status: spontaneous breathing, nonlabored ventilation and respiratory function stable Cardiovascular status: stable Postop Assessment: no headache, no backache, epidural receding, no apparent nausea or vomiting, able to ambulate, patient able to bend at knees and adequate PO intake Anesthetic complications: no    Last Vitals:  Vitals:   07/23/17 2129 07/24/17 0100  BP: 109/73 105/62  Pulse: 60 (!) 55  Resp: 18 18  Temp: 36.8 C 37.1 C  SpO2: 99% 100%    Last Pain:  Vitals:   07/24/17 0705  TempSrc:   PainSc: Asleep   Pain Goal: Patients Stated Pain Goal: 8 (07/23/17 1058)               [name]

## 2017-07-24 NOTE — Lactation Note (Signed)
This note was copied from a baby's chart. Lactation Consultation Note  Patient Name: Victoria Harlan StainsStephanie Bloyd AVWUJ'WToday's Date: 07/24/2017   Visited with P3 Mom, baby 22 hrs old.  Mom has been giving baby formula by bottle, along with some latching.  Mom not sure about breastfeeding.  She is concerned about her nipples not being right.  Talked about breastfeeding and a deep latch to breast.  Reassured Mom that flat nipples do not prevent babies from latching deeply.   Encouraged Mom to call her RN at next feeding.  Baby sleeping near FOB.  Talked to Mom about placing baby STS on her chest, and the benefits.  Encouraged Mom to offer breast at each feeding. Offered assistance.  Mom to call for assistance.    Judee ClaraSmith, Ind[place] Malmstrom E 07/24/2017, 4:21 PM

## 2017-07-25 MED ORDER — IBUPROFEN 600 MG PO TABS: 600.0000 mg | ORAL_TABLET | Freq: Four times a day (QID) | ORAL | 1 refills | Status: DC | PRN

## 2017-07-25 NOTE — Discharge Instructions (Signed)
Nothing in vagina for 6 weeks.  No sex, tampons, and douching.  Other instructions as in [facility] Discharge Booklet. °

## 2017-07-25 NOTE — Progress Notes (Signed)
Patient ID: Victoria Solis, female   DOB: 04-23-1990, 27 y.o.   MRN: [id number] Pt reports feeling frustrated. Wants to go home. Baby needs closer monitoring due to being jittery- blood sugar being tested. Pt will like to nest if baby not discharged. Denies nay pain complaints. Ambulating and voiding well. No fever or chills. 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

## 2017-07-25 NOTE — Lactation Note (Signed)
This note was copied from a baby's chart. Lactation Consultation Note  Patient Name: Victoria Harlan StainsStephanie Amparo ZOXWR'UToday's Date: 07/25/2017 Reason for consult: Follow-up assessment   Baby 41 hours old and blood sugar being check due to baby being jittery. Mother was poor historian regarding feedings but FOB stated he gave baby formula during the night when mother would not wake up. Parents would benefit from continued education since FOB stated baby was woken up all night by nurses and baby wanted to sleep. Discussed feeding frequency of newborns and cluster feeding.  Mom encouraged to feed baby 8-12 times/24 hours and with feeding cues.  Assisted w/ latching baby in football hold and mother still needs guidance to get a deep latch. Intermittent swallows observed once baby was on breast deep. Encouraged breastfeeding before giving formula to help establish milk supply.    Maternal Data    Feeding    LATCH Score                   Interventions    Lactation Tools Discussed/Used     Consult Status Consult Status: Follow-up Date: 07/26/17 Follow-up type: In-patient    Dahlia ByesBerkelhammer, Ruth [facility] 07/25/2017, 10:37 AM

## 2017-07-25 NOTE — Progress Notes (Signed)
Patient asleep holding infant. RN moved infant back to bassinet and educated about safe sleep.

## 2017-07-25 NOTE — Discharge Summary (Signed)
OB Discharge Summary     Patient Name: Victoria Solis DOB: 1990-10-12 MRN: [id number]  Date of admission: 07/23/2017 Delivering MD: Jackelyn Knife, TODD   Date of discharge: 07/25/2017  Admitting diagnosis: 39WKS INDUCTION Intrauterine pregnancy: [redacted]w[redacted]d     Secondary diagnosis:  Active Problems:   Indication for care in labor or delivery   VBAC, delivered  Additional problems: none     Discharge diagnosis: Term Pregnancy Delivered and VBAC                                                                                                Post partum procedures:none  Augmentation: AROM and Pitocin  Complications: None  Hospital course:  Induction of Labor With Vaginal Delivery   27 y.o. yo G3P3003 at [redacted]w[redacted]d was admitted to the hospital 07/23/2017 for induction of labor.  Indication for induction: Favorable cervix at term.  Patient had an uncomplicated labor course as follows: Membrane Rupture Time/Date: 12:20 PM ,07/23/2017   Intrapartum Procedures: Episiotomy: None [1]                                         Lacerations:  Labial [10]  Patient had delivery of a Viable infant.  Information for the patient's newborn:  Mahima, Hottle [[id number]]  Delivery Method: VBAC, Vacuum Assisted(Filed from Delivery Summary)   07/23/2017  Details of delivery can be found in separate delivery note.  Patient had a routine postpartum course. Patient is discharged home 07/25/17.  Physical exam  Vitals:   07/24/17 0100 07/24/17 1440 07/24/17 2146 07/25/17 0539  BP: 105/62 109/71 123/61 121/67  Pulse: (!) 55 (!) 57 60 69  Resp: 18  16 18   Temp: 98.8 F (37.1 C) 98.3 F (36.8 C) 98.3 F (36.8 C) 98.3 F (36.8 C)  TempSrc:  Oral Oral Oral  SpO2: 100% 100% 100% 100%  Weight:      Height:       General: alert, cooperative and no distress Lochia: appropriate Uterine Fundus: firm Incision: N/A DVT Evaluation: No evidence of DVT seen on physical exam. Labs: Lab Results  Component  Value Date   WBC 6.7 07/23/2017   HGB 10.5 (L) 07/23/2017   HCT 30.3 (L) 07/23/2017   MCV 89.9 07/23/2017   PLT 165 07/23/2017   CMP Latest Ref Rng & Units 08/04/2013  Glucose 70 - 99 mg/dL 782(N)  BUN 6 - 23 mg/dL 13  Creatinine 5.62 - 1.30 mg/dL 8.65  Sodium 784 - 696 mEq/L 142  Potassium 3.7 - 5.3 mEq/L 3.8  Chloride 96 - 112 mEq/L 105  CO2 19 - 32 mEq/L 24  Calcium 8.4 - 10.5 mg/dL 9.2  Total Protein 6.0 - 8.3 g/dL 7.2  Total Bilirubin 0.3 - 1.2 mg/dL 0.7  Alkaline Phos 39 - 117 U/L 32(L)  AST 0 - 37 U/L 14  ALT 0 - 35 U/L 14    Discharge instruction: per After Visit Summary and "Baby and Me Booklet".  After visit meds:  Allergies  as of 07/25/2017      Reactions   Amoxicillin Hives   Latex Itching   Penicillins Hives   Has patient had a PCN reaction causing immediate rash, facial/tongue/throat swelling, SOB or lightheadedness with hypotension: Yes Has patient had a PCN reaction causing severe rash involving mucus membranes or skin necrosis: Yes Has patient had a PCN reaction that required hospitalization: No Has patient had a PCN reaction occurring within the last 10 years: No If all of the above answers are "NO", then may proceed with Cephalosporin use.      Medication List    TAKE these medications   ferrous sulfate 325 (65 FE) MG tablet Take 325 mg by mouth daily with breakfast.   ibuprofen 600 MG tablet Commonly known as:  ADVIL,MOTRIN Take 1 tablet (600 mg total) by mouth every 6 (six) hours as needed.   PRENATAL GUMMIES/DHA & FA 0.4-32.5 MG Chew Chew 1 tablet daily by mouth.       Diet: routine diet  Activity: Advance as tolerated. Pelvic rest for 6 weeks.   Outpatient follow up:6 weeks Follow up Appt:No future appointments. Follow up Visit:No follow-ups on file.  Postpartum contraception: Not Discussed  Newborn Data: Live born female  Birth Weight: 6 lb 14.4 oz (3130 g) APGAR: 8, 9  Newborn Delivery   Birth date/time:  07/23/2017  17:28:00 Delivery type:  VBAC, Vacuum Assisted     Baby Feeding: Breast Disposition:rooming in   07/25/2017 Cathrine Musterecilia W Monterrius Cardosa, DO

## 2017-07-26 ENCOUNTER — Ambulatory Visit: Payer: Self-pay

## 2017-07-26 NOTE — Lactation Note (Signed)
This note was copied from a baby's chart. Lactation Consultation Note  Patient Name: Victoria Harlan StainsStephanie Bourbon JXBJY'NToday's Date: 07/26/2017 Reason for consult: Follow-up assessment;Other (Comment)(per mom has changed to bottle/ see LC note )  Baby is 4068 hours old  Per mom plans to only bottle feed and just wants to go home.  LC reviewed with mom how to dry up her milk.     Maternal Data    Feeding Feeding Type: Formula Nipple Type: Slow - flow  LATCH Score                   Interventions Interventions: Breast feeding basics reviewed  Lactation Tools Discussed/Used     Consult Status Consult Status: Complete Date: 07/26/17    Kathrin GreathouseMargaret Ann Mahad Newstrom 07/26/2017, 2:20 PM

## 2017-11-20 ENCOUNTER — Ambulatory Visit: Payer: Medicaid Other | Admitting: Obstetrics

## 2017-12-02 ENCOUNTER — Encounter: Payer: Self-pay | Admitting: Obstetrics

## 2017-12-02 ENCOUNTER — Other Ambulatory Visit (HOSPITAL_COMMUNITY)
Admission: RE | Admit: 2017-12-02 | Discharge: 2017-12-02 | Disposition: A | Discharge status: Home / Self Care | Payer: Medicaid Other | Source: Ambulatory Visit | Attending: Obstetrics | Admitting: Obstetrics

## 2017-12-02 ENCOUNTER — Ambulatory Visit (INDEPENDENT_AMBULATORY_CARE_PROVIDER_SITE_OTHER): Payer: Medicaid Other | Admitting: Obstetrics

## 2017-12-02 VITALS — BP 101/63 | HR 60 | Resp 16 | Ht 62.0 in | Wt 171.1 lb

## 2017-12-02 DIAGNOSIS — Z0000 Encounter for general adult medical examination without abnormal findings: Secondary | ICD-10-CM | POA: Diagnosis not present

## 2017-12-02 DIAGNOSIS — Z3202 Encounter for pregnancy test, result negative: Secondary | ICD-10-CM

## 2017-12-02 DIAGNOSIS — N898 Other specified noninflammatory disorders of vagina: Secondary | ICD-10-CM

## 2017-12-02 DIAGNOSIS — Z01419 Encounter for gynecological examination (general) (routine) without abnormal findings: Secondary | ICD-10-CM | POA: Diagnosis present

## 2017-12-02 DIAGNOSIS — Z3042 Encounter for surveillance of injectable contraceptive: Secondary | ICD-10-CM

## 2017-12-02 LAB — POCT URINE PREGNANCY: Preg Test, Ur: NEGATIVE

## 2017-12-02 NOTE — Progress Notes (Signed)
Subjective:        Victoria Solis is a 27 y.o. female here for a routine exam.  Current complaints: Malodorous vaginal discharge..    Personal health questionnaire:  Is patient Ashkenazi Jewish, have a family history of breast and/or ovarian cancer: no Is there a family history of uterine cancer diagnosed at age < 2, gastrointestinal cancer, urinary tract cancer, family member who is a Personnel officer syndrome-associated carrier: no Is the patient overweight and hypertensive, family history of diabetes, personal history of gestational diabetes, preeclampsia or PCOS: no Is patient over 41, have PCOS,  family history of premature CHD under age 99, diabetes, smoke, have hypertension or peripheral artery disease:  no At any time, has a partner hit, kicked or otherwise hurt or frightened you?: no Over the past 2 weeks, have you felt down, depressed or hopeless?: no Over the past 2 weeks, have you felt little interest or pleasure in doing things?:no   Gynecologic History Patient's last menstrual period was 11/07/2017 (approximate). Contraception: Depo-Provera injections Last Pap: 2018. Results were: normal Last mammogram: n/a. Results were: n/a  Obstetric History OB History  Gravida Para Term Preterm AB Living  3 3 3     3   SAB TAB Ectopic Multiple Live Births        0 3    # Outcome Date GA Lbr Len/2nd Weight Sex Delivery Anes PTL Lv  3 Term 07/23/17 [redacted]w[redacted]d 04:55 / 00:13 6 lb 14.4 oz (3.13 kg) M VBAC, Vacuum EPI  LIV  2 Term      Vag-Spont   LIV  1 Term      CS-Unspec   LIV    Past Medical History:  Diagnosis Date  . Migraine     Past Surgical History:  Procedure Laterality Date  . CESAREAN SECTION       Current Outpatient Medications:  .  ibuprofen (ADVIL,MOTRIN) 600 MG tablet, Take 1 tablet (600 mg total) by mouth every 6 (six) hours as needed., Disp: 40 tablet, Rfl: 1 .  ferrous sulfate 325 (65 FE) MG tablet, Take 325 mg by mouth daily with breakfast., Disp: , Rfl:  .   Prenatal MV-Min-FA-Omega-3 (PRENATAL GUMMIES/DHA & FA) 0.4-32.5 MG CHEW, Chew 1 tablet daily by mouth. (Patient not taking: Reported on 12/02/2017), Disp: 30 tablet, Rfl: 12 Allergies  Allergen Reactions  . Amoxicillin Hives  . Latex Itching  . Penicillins Hives    Has patient had a PCN reaction causing immediate rash, facial/tongue/throat swelling, SOB or lightheadedness with hypotension: Yes Has patient had a PCN reaction causing severe rash involving mucus membranes or skin necrosis: Yes Has patient had a PCN reaction that required hospitalization: No Has patient had a PCN reaction occurring within the last 10 years: No If all of the above answers are "NO", then may proceed with Cephalosporin use.     Social History   Tobacco Use  . Smoking status: Never Smoker  . Smokeless tobacco: Never Used  Substance Use Topics  . Alcohol use: No    Family History  Problem Relation Age of Onset  . Hypertension Mother   . Diabetes Mother   . Stroke Mother   . Kidney failure Mother   . Hypertension Father   . Diabetes Father       Review of Systems  Constitutional: negative for fatigue and weight loss Respiratory: negative for cough and wheezing Cardiovascular: negative for chest pain, fatigue and palpitations Gastrointestinal: negative for abdominal pain and change in bowel habits  Musculoskeletal:negative for myalgias Neurological: negative for gait problems and tremors Behavioral/Psych: negative for abusive relationship, depression Endocrine: negative for temperature intolerance    Genitourinary:negative for abnormal menstrual periods, genital lesions, hot flashes, sexual problems and vaginal discharge Integument/breast: negative for breast lump, breast tenderness, nipple discharge and skin lesion(s)    Objective:       BP 101/63 (BP Location: Left Arm, Patient Position: Sitting, Cuff Size: Normal)   Pulse 60   Resp 16   Ht 5\' 2"  (1.575 m)   Wt 171 lb 1.6 oz (77.6 kg)   LMP  11/07/2017 (Approximate)   Breastfeeding? No   BMI 31.29 kg/m  General:   alert  Skin:   no rash or abnormalities  Lungs:   clear to auscultation bilaterally  Heart:   regular rate and rhythm, S1, S2 normal, no murmur, click, rub or gallop  Breasts:   normal without suspicious masses, skin or nipple changes or axillary nodes  Abdomen:  normal findings: no organomegaly, soft, non-tender and no hernia  Pelvis:  External genitalia: normal general appearance Urinary system: urethral meatus normal and bladder without fullness, nontender Vaginal: normal without tenderness, induration or masses Cervix: normal appearance Adnexa: normal bimanual exam Uterus: anteverted and non-tender, normal size   Lab Review Urine pregnancy test Labs reviewed yes Radiologic studies reviewed no  50% of 20 min visit spent on counseling and coordination of care.   Assessment:     1. Encounter for routine gynecological examination with Papanicolaou smear of cervix Rx: - Cytology - PAP( [place])  2. Vaginal discharge Rx: - Cervicovaginal ancillary only( [place])  3. Encounter for surveillance of injectable contraceptive Rx: - POCT urine pregnancy  4. Urine pregnancy test negative    Plan:    Education reviewed: calcium supplements, depression evaluation, low fat, low cholesterol diet, safe sex/STD prevention, self breast exams and weight bearing exercise. Contraception: Depo-Provera injections. Follow up in: 1 year.   No orders of the defined types were placed in this encounter.  Orders Placed This Encounter  Procedures  . POCT urine pregnancy    Brock Bad MD 12-02-2017

## 2017-12-03 LAB — CYTOLOGY - PAP
Chlamydia: NEGATIVE
Diagnosis: NEGATIVE
NEISSERIA GONORRHEA: NEGATIVE

## 2017-12-03 LAB — CERVICOVAGINAL ANCILLARY ONLY
Bacterial vaginitis: POSITIVE — AB
CANDIDA VAGINITIS: NEGATIVE
Trichomonas: NEGATIVE

## 2017-12-04 ENCOUNTER — Other Ambulatory Visit: Payer: Self-pay | Admitting: Obstetrics

## 2017-12-04 DIAGNOSIS — B9689 Other specified bacterial agents as the cause of diseases classified elsewhere: Secondary | ICD-10-CM

## 2017-12-04 DIAGNOSIS — N760 Acute vaginitis: Principal | ICD-10-CM

## 2017-12-04 MED ORDER — TINIDAZOLE 500 MG PO TABS: 1000.0000 mg | ORAL_TABLET | Freq: Every day | ORAL | 2 refills | Status: DC

## 2017-12-05 ENCOUNTER — Telehealth: Payer: Self-pay

## 2017-12-05 NOTE — Telephone Encounter (Signed)
PA APPROVAL faxed to Lenward Chancellor PA [phone] Effective 12/05/17-03/25/18

## 2017-12-11 ENCOUNTER — Encounter: Payer: Medicaid Other | Admitting: Certified Nurse Midwife

## 2017-12-20 ENCOUNTER — Ambulatory Visit (INDEPENDENT_AMBULATORY_CARE_PROVIDER_SITE_OTHER): Payer: Medicaid Other

## 2017-12-20 VITALS — BP 124/81 | HR 53 | Resp 16 | Ht 62.0 in | Wt 169.2 lb

## 2017-12-20 DIAGNOSIS — N760 Acute vaginitis: Secondary | ICD-10-CM

## 2017-12-20 DIAGNOSIS — B9689 Other specified bacterial agents as the cause of diseases classified elsewhere: Secondary | ICD-10-CM

## 2017-12-20 MED ORDER — MEDROXYPROGESTERONE ACETATE 150 MG/ML IM SUSP
150.0000 mg | Freq: Once | INTRAMUSCULAR | Status: DC
Start: 2017-12-20 — End: 2017-12-20

## 2017-12-20 NOTE — Progress Notes (Signed)
Patient is here today reporting brownish vaginal discharged and odor still after completing Tinidazole (Tindamax) 500 mg 2 tablets by mouth for five days for BV. Patient was advised she could do a self swab, urine pregnancy and Depo Provera injection today while she takes another round of Tinidazole. Patient states she wants to take another round of the antibiotic to see if the BV clears before beginning her Depo Provera injection. Patient was advised to contact the office on 11/26 to advise how she doing - if she's still having vaginal discharge after two rounds of antibiotics, schedule a follow up office visit with provider or if the antibiotics resolved the BV, schedule a nurse visit to have urine pregnancy and Depo Provera done. Patient stated she understood and will contact the office next week.

## 2018-01-02 ENCOUNTER — Ambulatory Visit: Payer: Medicaid Other

## 2018-01-03 ENCOUNTER — Telehealth: Payer: Self-pay | Admitting: Obstetrics

## 2018-01-13 ENCOUNTER — Encounter: Payer: Self-pay | Admitting: Obstetrics

## 2018-02-04 ENCOUNTER — Other Ambulatory Visit: Payer: Medicaid Other

## 2018-02-04 NOTE — Progress Notes (Deleted)
disregard

## 2018-03-10 ENCOUNTER — Inpatient Hospital Stay (HOSPITAL_COMMUNITY): Payer: Medicaid Other

## 2018-03-10 ENCOUNTER — Inpatient Hospital Stay (HOSPITAL_COMMUNITY)
Admission: AD | Admit: 2018-03-10 | Discharge: 2018-03-10 | Disposition: A | Discharge status: Home / Self Care | Payer: Medicaid Other | Attending: Obstetrics & Gynecology | Admitting: Obstetrics & Gynecology

## 2018-03-10 ENCOUNTER — Encounter (HOSPITAL_COMMUNITY): Payer: Self-pay | Admitting: *Deleted

## 2018-03-10 DIAGNOSIS — R1030 Lower abdominal pain, unspecified: Secondary | ICD-10-CM | POA: Insufficient documentation

## 2018-03-10 DIAGNOSIS — Z3A01 Less than 8 weeks gestation of pregnancy: Secondary | ICD-10-CM | POA: Insufficient documentation

## 2018-03-10 DIAGNOSIS — O26891 Other specified pregnancy related conditions, first trimester: Secondary | ICD-10-CM | POA: Diagnosis not present

## 2018-03-10 DIAGNOSIS — R109 Unspecified abdominal pain: Secondary | ICD-10-CM

## 2018-03-10 DIAGNOSIS — Z880 Allergy status to penicillin: Secondary | ICD-10-CM | POA: Diagnosis not present

## 2018-03-10 DIAGNOSIS — Z3491 Encounter for supervision of normal pregnancy, unspecified, first trimester: Secondary | ICD-10-CM

## 2018-03-10 LAB — URINALYSIS, ROUTINE W REFLEX MICROSCOPIC
BILIRUBIN URINE: NEGATIVE
GLUCOSE, UA: NEGATIVE mg/dL
HGB URINE DIPSTICK: NEGATIVE
Ketones, ur: NEGATIVE mg/dL
Leukocytes, UA: NEGATIVE
Nitrite: NEGATIVE
PROTEIN: NEGATIVE mg/dL
Specific Gravity, Urine: >1.03 — ABNORMAL HIGH (ref 1.005–1.030)
pH: 6 (ref 5.0–8.0)

## 2018-03-10 LAB — WET PREP, GENITAL
Sperm: NONE SEEN
Trich, Wet Prep: NONE SEEN
Yeast Wet Prep HPF POC: NONE SEEN

## 2018-03-10 LAB — CBC
HCT: 35.4 % — ABNORMAL LOW (ref 36.0–46.0)
Hemoglobin: 12 g/dL (ref 12.0–15.0)
MCH: 30.5 pg (ref 26.0–34.0)
MCHC: 33.9 g/dL (ref 30.0–36.0)
MCV: 90.1 fL (ref 80.0–100.0)
Platelets: 217 10*3/uL (ref 150–400)
RBC: 3.93 MIL/uL (ref 3.87–5.11)
RDW: 12.6 % (ref 11.5–15.5)
WBC: 8.4 10*3/uL (ref 4.0–10.5)
nRBC: 0 % (ref 0.0–0.2)

## 2018-03-10 LAB — POCT PREGNANCY, URINE: Preg Test, Ur: POSITIVE — AB

## 2018-03-10 LAB — HCG, QUANTITATIVE, PREGNANCY: HCG, BETA CHAIN, QUANT, S: 84658 m[IU]/mL — AB (ref <5)

## 2018-03-10 NOTE — MAU Note (Signed)
Pt reports cramping with urination but denies bleeding. Positive home preg test.

## 2018-03-10 NOTE — MAU Provider Note (Signed)
Chief Complaint: Possible Pregnancy and Abdominal Pain   First Provider Initiated Contact with Patient 03/10/18 [phone]     SUBJECTIVE HPI: Victoria Solis is a 28 y.o. [id number] at Unknown gestation who presents to [facility] Admissions reporting abdominal cramping. Has irregular periods since having her last baby 7 months ago. Reports mid lower abdominal cramping mainly after urinating. Denies dysuria, vaginal bleeding, vaginal discharge, n/v/d, or fever.   Location: lower abdomen Quality: cramping Severity: 4/10 on pain scale Duration: 1 week Timing: intermittent Modifying factors: worse after voiding Associated signs and symptoms: none  Past Medical History:  Diagnosis Date  . Migraine    OB History  Gravida Para Term Preterm AB Living  4 3 3     3   SAB TAB Ectopic Multiple Live Births        0 3    # Outcome Date GA Lbr Len/2nd Weight Sex Delivery Anes PTL Lv  4 Current           3 Term 07/23/17 [redacted]w[redacted]d 04:55 / 00:13 3130 g M VBAC, Vacuum EPI  LIV  2 Term      Vag-Spont   LIV  1 Term      CS-Unspec   LIV   Past Surgical History:  Procedure Laterality Date  . CESAREAN SECTION     Social History   Socioeconomic History  . Marital status: Single    Spouse name: Not on file  . Number of children: Not on file  . Years of education: Not on file  . Highest education level: Not on file  Occupational History  . Not on file  Social Needs  . Financial resource strain: Not on file  . Food insecurity:    Worry: Not on file    Inability: Not on file  . Transportation needs:    Medical: Not on file    Non-medical: Not on file  Tobacco Use  . Smoking status: Never Smoker  . Smokeless tobacco: Never Used  Substance and Sexual Activity  . Alcohol use: No  . Drug use: No  . Sexual activity: Yes    Partners: Male    Comment: patient is trying for pregnancy  Lifestyle  . Physical activity:    Days per week: Not on file    Minutes per session: Not on file  . Stress: Not  on file  Relationships  . Social connections:    Talks on phone: Not on file    Gets together: Not on file    Attends religious service: Not on file    Active member of club or organization: Not on file    Attends meetings of clubs or organizations: Not on file    Relationship status: Not on file  . Intimate partner violence:    Fear of current or ex partner: Not on file    Emotionally abused: Not on file    Physically abused: Not on file    Forced sexual activity: Not on file  Other Topics Concern  . Not on file  Social History Narrative  . Not on file   Family History  Problem Relation Age of Onset  . Hypertension Mother   . Diabetes Mother   . Stroke Mother   . Kidney failure Mother   . Hypertension Father   . Diabetes Father    No current facility-administered medications on file prior to encounter.    Current Outpatient Medications on File Prior to Encounter  Medication Sig Dispense Refill  .  ferrous sulfate 325 (65 FE) MG tablet Take 325 mg by mouth daily with breakfast.    . Prenatal MV-Min-FA-Omega-3 (PRENATAL GUMMIES/DHA & FA) 0.4-32.5 MG CHEW Chew 1 tablet daily by mouth. 30 tablet 12   Allergies  Allergen Reactions  . Amoxicillin Hives  . Latex Itching  . Penicillins Hives    Has patient had a PCN reaction causing immediate rash, facial/tongue/throat swelling, SOB or lightheadedness with hypotension: Yes Has patient had a PCN reaction causing severe rash involving mucus membranes or skin necrosis: Yes Has patient had a PCN reaction that required hospitalization: No Has patient had a PCN reaction occurring within the last 10 years: No If all of the above answers are "NO", then may proceed with Cephalosporin use.     I have reviewed patient's Past Medical Hx, Surgical Hx, Family Hx, Social Hx, medications and allergies.   Review of Systems  Constitutional: Negative.   Gastrointestinal: Positive for abdominal pain. Negative for constipation, diarrhea,  nausea and vomiting.  Genitourinary: Negative.     OBJECTIVE Patient Vitals for the past 24 hrs:  BP Temp Temp src Pulse Resp SpO2 Height Weight  03/10/18 1145 122/78 - - (!) 53 - - - -  03/10/18 0849 110/70 98.3 F (36.8 C) Oral (!) 48 16 99 % 5\' 4"  (1.626 m) 75.3 kg   Constitutional: Well-developed, well-nourished female in no acute distress.  Cardiovascular: normal rate & rhythm, no murmur Respiratory: normal rate and effort. Lung sounds clear throughout GI: Abd soft, non-tender, Pos BS x 4. No guarding or rebound tenderness MS: Extremities nontender, no edema, normal ROM Neurologic: Alert and oriented x 4.    LAB RESULTS Results for orders placed or performed during the hospital encounter of 03/10/18 (from the past 24 hour(s))  Urinalysis, Routine w reflex microscopic     Status: Abnormal   Collection Time: 03/10/18  8:45 AM  Result Value Ref Range   Color, Urine YELLOW YELLOW   APPearance CLEAR CLEAR   Specific Gravity, Urine >1.030 (H) 1.005 - 1.030   pH 6.0 5.0 - 8.0   Glucose, UA NEGATIVE NEGATIVE mg/dL   Hgb urine dipstick NEGATIVE NEGATIVE   Bilirubin Urine NEGATIVE NEGATIVE   Ketones, ur NEGATIVE NEGATIVE mg/dL   Protein, ur NEGATIVE NEGATIVE mg/dL   Nitrite NEGATIVE NEGATIVE   Leukocytes, UA NEGATIVE NEGATIVE  Pregnancy, urine POC     Status: Abnormal   Collection Time: 03/10/18  8:48 AM  Result Value Ref Range   Preg Test, Ur POSITIVE (A) NEGATIVE  Wet prep, genital     Status: Abnormal   Collection Time: 03/10/18 10:10 AM  Result Value Ref Range   Yeast Wet Prep HPF POC NONE SEEN NONE SEEN   Trich, Wet Prep NONE SEEN NONE SEEN   Clue Cells Wet Prep HPF POC PRESENT (A) NONE SEEN   WBC, Wet Prep HPF POC FEW (A) NONE SEEN   Sperm NONE SEEN   CBC     Status: Abnormal   Collection Time: 03/10/18 10:23 AM  Result Value Ref Range   WBC 8.4 4.0 - 10.5 K/uL   RBC 3.93 3.87 - 5.11 MIL/uL   Hemoglobin 12.0 12.0 - 15.0 g/dL   HCT 11.935.4 (L) 14.736.0 - 82.946.0 %   MCV  90.1 80.0 - 100.0 fL   MCH 30.5 26.0 - 34.0 pg   MCHC 33.9 30.0 - 36.0 g/dL   RDW 56.212.6 13.011.5 - 86.515.5 %   Platelets 217 150 - 400 K/uL   nRBC  0.0 0.0 - 0.2 %  hCG, quantitative, pregnancy     Status: Abnormal   Collection Time: 03/10/18 10:23 AM  Result Value Ref Range   hCG, Beta Chain, Quant, S 84,658 (H) <5 mIU/mL    IMAGING US Ob Comp Less 14 Wks  Result Date: 03/10/2018 CLINICAL DATA:  Abdominal pain in 1st trimester pregnancy. Unknown LMP. EXAM: OBSTETRIC <14 WK ULTRASOUND TECHNIQUE: Transabdominal ultrasound was performed for evaluation of the gestation as well as the maternal uterus and adnexal regions. COMPARISON:  None. FINDINGS: Intrauterine gestational sac: Single Yolk sac:  Visualized. Embryo:  Visualized. Cardiac Activity: Visualized. Heart Rate: 156 bpm CRL:   16 mm   7 w 6 d                  [place] EDC: 10/21/2018 Subchorionic hemorrhage:  None visualized. Maternal uterus/adnexae: Both ovaries are normal in appearance. No mass or abnormal free fluid identified. IMPRESSION: Single living IUP measuring 7 weeks 6 days, with [place] EDC of 10/21/2018. No significant maternal uterine or adnexal abnormality identified. Electronically Signed   By: Myles Rosenthal M.D.   On: 03/10/2018 11:34    MAU COURSE Orders Placed This Encounter  Procedures  . Wet prep, genital  . US OB Comp Less 14 Wks  . Urinalysis, Routine w reflex microscopic  . CBC  . hCG, quantitative, pregnancy  . Pregnancy, urine POC  . Discharge patient   No orders of the defined types were placed in this encounter.   MDM U/a negative for infection +UPT UA, wet prep, GC/chlamydia, CBC, ABO/Rh, quant hCG, and [place] today to rule out ectopic pregnancy Ultrasound shows live IUP Pt discharged. Start prenatal care.   ASSESSMENT 1. Normal IUP (intrauterine pregnancy) on prenatal ultrasound, first trimester   2. Abdominal pain during pregnancy in first trimester     PLAN Discharge home in stable condition.  Allergies as of  03/10/2018      Reactions   Amoxicillin Hives   Latex Itching   Penicillins Hives   Has patient had a PCN reaction causing immediate rash, facial/tongue/throat swelling, SOB or lightheadedness with hypotension: Yes Has patient had a PCN reaction causing severe rash involving mucus membranes or skin necrosis: Yes Has patient had a PCN reaction that required hospitalization: No Has patient had a PCN reaction occurring within the last 10 years: No If all of the above answers are "NO", then may proceed with Cephalosporin use.      Medication List    STOP taking these medications   ibuprofen 600 MG tablet Commonly known as:  ADVIL,MOTRIN   tinidazole 500 MG tablet Commonly known as:  TINDAMAX     TAKE these medications   ferrous sulfate 325 (65 FE) MG tablet Take 325 mg by mouth daily with breakfast.   PRENATAL GUMMIES/DHA & FA 0.4-32.5 MG Chew Chew 1 tablet daily by mouth.        Judeth Horn, NP 03/10/2018  4:34 PM

## 2018-03-10 NOTE — Discharge Instructions (Signed)
Warning Signs During Pregnancy  A pregnancy lasts about 40 weeks, starting from the first day of your last period until the baby is born. Pregnancy is divided into three phases called trimesters.  · The first trimester refers to week 1 through week 13 of pregnancy.  · The second trimester is the start of week 14 through the end of week 27.  · The third trimester is the start of week 28 until you deliver your baby.  During each trimester of pregnancy, certain signs and symptoms may indicate a problem. Talk with your health care provider about your current health and any medical conditions you have. Make sure you know the symptoms that you should watch for and report.  How does this affect me?    Warning signs in the first trimester  While some changes during the first trimester may be uncomfortable, most do not represent a serious problem. Let your health care provider know if you have any of the following warning signs in the first trimester:  · You cannot eat or drink without vomiting, and this lasts for longer than a day.  · You have vaginal bleeding or spotting along with menstrual-like cramping.  · You have diarrhea for longer than a day.  · You have a fever or other signs of infection, such as:  ? Pain or burning when you urinate.  ? Foul smelling or thick or yellowish vaginal discharge.  Warning signs in the second trimester  As your baby grows and changes during the second trimester, there are additional signs and symptoms that may indicate a problem. These include:  · Signs and symptoms of infection, including a fever.  · Signs or symptoms of a miscarriage or preterm labor, such as regular contractions, menstrual-like cramping, or lower abdominal pain.  · Bloody or watery vaginal discharge or obvious vaginal bleeding.  · Feeling like your heart is pounding.  · Having trouble breathing.  · Nausea, vomiting, or diarrhea that lasts for longer than a day.  · Craving non-food items, such as clay, chalk, or dirt.  This may be a sign of a very treatable medical condition called pica.  Later in your second trimester, watch for signs and symptoms of a serious medical condition called preeclampsia.These include:  · Changes in your vision.  · A severe headache that does not go away.  · Nausea and vomiting.  It is also important to notice if your baby stops moving or moves less than usual during this time.  Warning signs in the third trimester  As you approach the third trimester, your baby is growing and your body is preparing for the birth of your baby. In your third trimester, be sure to let your health care provider know if:  · You have signs and symptoms of infection, including a fever.  · You have vaginal bleeding.  · You notice that your baby is moving less than usual or is not moving.  · You have nausea, vomiting, or diarrhea that lasts for longer than a day.  · You have a severe headache that does not go away.  · You have vision changes, including seeing spots or having blurry or double vision.  · You have increased swelling in your hands or face.  How does this affect my baby?  Throughout your pregnancy, always report any of the warning signs of a problem to your health care provider. This can help prevent complications that may affect your baby, including:  · Increased risk   for premature birth.  · Infection that may be transmitted to your baby.  · Increased risk for stillbirth.  Contact a health care provider if:  · You have any of the warning signs of a problem for the current trimester of your pregnancy.  · Any of the following apply to you during any trimester of pregnancy:  ? You have strong emotions, such as sadness or anxiety, that interfere with work or personal relationships.  ? You feel unsafe in your home and need help finding a safe place to live.  ? You are using tobacco products, alcohol, or drugs and you need help to stop.  Get help right away if:  You have signs or symptoms of labor before 37 weeks of  pregnancy. These include:  · Contractions that are 5 minutes or less apart, or that increase in frequency, intensity, or length.  · Sudden, sharp abdominal pain or low back pain.  · Uncontrolled gush or trickle of fluid from your vagina.  Summary  · A pregnancy lasts about 40 weeks, starting from the first day of your last period until the baby is born. Pregnancy is divided into three phases called trimesters. Each trimester has warning signs to watch for.  · Always report any warning signs to your health care provider in order to prevent complications that may affect both you and your baby.  · Talk with your health care provider about your current health and any medical conditions you have. Make sure you know the symptoms that you should watch for and report.  This information is not intended to replace advice given to you by your health care provider. Make sure you discuss any questions you have with your health care provider.  Document Released: 11/01/2016 Document Revised: 11/01/2016 Document Reviewed: 11/01/2016  [employer]vier Interactive Patient Education © 2019 [employer]

## 2018-03-11 ENCOUNTER — Inpatient Hospital Stay (HOSPITAL_COMMUNITY)
Admission: AD | Admit: 2018-03-11 | Discharge: 2018-03-11 | Disposition: A | Discharge status: Home / Self Care | Payer: Medicaid Other | Attending: Obstetrics & Gynecology | Admitting: Obstetrics & Gynecology

## 2018-03-11 ENCOUNTER — Encounter (HOSPITAL_COMMUNITY): Payer: Self-pay | Admitting: *Deleted

## 2018-03-11 ENCOUNTER — Other Ambulatory Visit: Payer: Self-pay

## 2018-03-11 DIAGNOSIS — O200 Threatened abortion: Secondary | ICD-10-CM | POA: Diagnosis not present

## 2018-03-11 DIAGNOSIS — O26891 Other specified pregnancy related conditions, first trimester: Secondary | ICD-10-CM

## 2018-03-11 DIAGNOSIS — Z3A08 8 weeks gestation of pregnancy: Secondary | ICD-10-CM | POA: Diagnosis not present

## 2018-03-11 DIAGNOSIS — O210 Mild hyperemesis gravidarum: Secondary | ICD-10-CM | POA: Insufficient documentation

## 2018-03-11 DIAGNOSIS — R109 Unspecified abdominal pain: Secondary | ICD-10-CM

## 2018-03-11 DIAGNOSIS — N939 Abnormal uterine and vaginal bleeding, unspecified: Secondary | ICD-10-CM

## 2018-03-11 DIAGNOSIS — R110 Nausea: Secondary | ICD-10-CM | POA: Diagnosis not present

## 2018-03-11 DIAGNOSIS — Z880 Allergy status to penicillin: Secondary | ICD-10-CM | POA: Diagnosis not present

## 2018-03-11 LAB — GC/CHLAMYDIA PROBE AMP (~~LOC~~) NOT AT ARMC
Chlamydia: NEGATIVE
Neisseria Gonorrhea: NEGATIVE

## 2018-03-11 MED ORDER — ONDANSETRON 4 MG PO TBDP
4.0000 mg | ORAL_TABLET | Freq: Once | ORAL | Status: AC
  Administered 2018-03-11: 4 mg via ORAL
  Filled 2018-03-11: qty 1

## 2018-03-11 MED ORDER — METRONIDAZOLE 500 MG PO TABS: 500.0000 mg | ORAL_TABLET | Freq: Two times a day (BID) | ORAL | 0 refills | Status: DC

## 2018-03-11 MED ORDER — ONDANSETRON 4 MG PO TBDP: 4.0000 mg | ORAL_TABLET | Freq: Three times a day (TID) | ORAL | 0 refills | Status: DC | PRN

## 2018-03-11 NOTE — MAU Note (Signed)
Went to bathroom twice this morning and saw blood, stomach has been hurting.

## 2018-03-11 NOTE — MAU Provider Note (Signed)
History     CSN: [id number]  Arrival date and time: 03/11/18 3267   First Provider Initiated Contact with Patient 03/11/18 0901      Chief Complaint  Patient presents with  . Abdominal Pain  . Vaginal Bleeding  . Nausea   Victoria Solis is a 28 y.o. G4P3 at [redacted]w[redacted]d by early [place] or presents to MAU with complaints of abdominal pain, vaginal bleeding and nausea. She was seen in MAU yesterday for abdominal pain and nausea. She denies pain or nausea being unchanged. Rates lower abdominal cramping 3/10. She reports new onset vaginal bleeding that started this morning, describes vaginal bleeding when she wipes. Denies having to wear pad or panty liner. [place] completed yesterday normal IUP.    OB History    Gravida  4   Para  3   Term  3   Preterm      AB      Living  3     SAB      TAB      Ectopic      Multiple  0   Live Births  3           Past Medical History:  Diagnosis Date  . Migraine     Past Surgical History:  Procedure Laterality Date  . CESAREAN SECTION      Family History  Problem Relation Age of Onset  . Hypertension Mother   . Diabetes Mother   . Stroke Mother   . Kidney failure Mother   . Hypertension Father   . Diabetes Father     Social History   Tobacco Use  . Smoking status: Never Smoker  . Smokeless tobacco: Never Used  Substance Use Topics  . Alcohol use: No  . Drug use: No    Allergies:  Allergies  Allergen Reactions  . Amoxicillin Hives  . Latex Itching  . Penicillins Hives    Has patient had a PCN reaction causing immediate rash, facial/tongue/throat swelling, SOB or lightheadedness with hypotension: Yes Has patient had a PCN reaction causing severe rash involving mucus membranes or skin necrosis: Yes Has patient had a PCN reaction that required hospitalization: No Has patient had a PCN reaction occurring within the last 10 years: No If all of the above answers are "NO", then may proceed with Cephalosporin use.      Medications Prior to Admission  Medication Sig Dispense Refill Last Dose  . ferrous sulfate 325 (65 FE) MG tablet Take 325 mg by mouth daily with breakfast.   Taking  . Prenatal MV-Min-FA-Omega-3 (PRENATAL GUMMIES/DHA & FA) 0.4-32.5 MG CHEW Chew 1 tablet daily by mouth. 30 tablet 12 Taking    Review of Systems  Constitutional: Negative.   Respiratory: Negative.   Cardiovascular: Negative.   Gastrointestinal: Positive for abdominal pain and nausea. Negative for constipation, diarrhea and vomiting.  Genitourinary: Positive for vaginal bleeding.   Physical Exam   Blood pressure 114/64, pulse (!) 52, temperature 98.3 F (36.8 C), temperature source Oral, resp. rate 16, height 5\' 4"  (1.626 m), weight 75.2 kg, SpO2 99 %, not currently breastfeeding.  Physical Exam  Nursing note and vitals reviewed. Constitutional: She appears well-developed and well-nourished. No distress.  Cardiovascular: Normal rate, regular rhythm and normal heart sounds.  Respiratory: Effort normal and breath sounds normal. No respiratory distress. She has no wheezes. She has no rales.  GI: Soft. She exhibits no distension. There is no abdominal tenderness. There is no rebound and no guarding.  Genitourinary:    Genitourinary Comments: Pelvic exam: Cervix pink, visually closed, without lesion, scant amount of dark red vaginal bleeding present, vaginal walls and external genitalia normal Bimanual exam: Cervix 0/long/high, firm, anterior, neg CMT, uterus nontender, nonenlarged, adnexa without tenderness, enlargement, or mass      MAU Course  Procedures  MDM  Zofran 4mg  ODT given in MAU  Bedside US performed   Pt informed that the ultrasound is considered a limited OB ultrasound and is not intended to be a complete ultrasound exam.  Patient also informed that the ultrasound is not being completed with the intent of assessing for fetal or placental anomalies or any pelvic abnormalities.  Explained that the  purpose of today's ultrasound is to assess for  viability.  Patient acknowledges the purpose of the exam and the limitations of the study.    FHR 141 by bedside US   Swabs deferred due to recent GC/C and wet prep performed yesterday.   Educated on pelvic rest and threatened miscarriage, discussed reasons to return to MAU. Follow up as scheduled for NOB appointment. Rx for Zofran and flagyl sent to pharmacy of choice. Pt stable at time of discharge.   Assessment and Plan   1. Threatened miscarriage in early pregnancy   2. Abdominal pain during pregnancy in first trimester   3. Vaginal spotting   4. Nausea    Discharge home Return to MAU as needed Follow up as scheduled for NOB appointment  Rx for Flagyl and Zofran   Allergies as of 03/11/2018      Reactions   Amoxicillin Hives   Latex Itching   Penicillins Hives   Has patient had a PCN reaction causing immediate rash, facial/tongue/throat swelling, SOB or lightheadedness with hypotension: Yes Has patient had a PCN reaction causing severe rash involving mucus membranes or skin necrosis: Yes Has patient had a PCN reaction that required hospitalization: No Has patient had a PCN reaction occurring within the last 10 years: No If all of the above answers are "NO", then may proceed with Cephalosporin use.      Medication List    TAKE these medications   ferrous sulfate 325 (65 FE) MG tablet Take 325 mg by mouth daily with breakfast.   metroNIDAZOLE 500 MG tablet Commonly known as:  FLAGYL Take 1 tablet (500 mg total) by mouth 2 (two) times daily.   ondansetron 4 MG disintegrating tablet Commonly known as:  ZOFRAN-ODT Take 1 tablet (4 mg total) by mouth every 8 (eight) hours as needed for nausea or vomiting.   PRENATAL GUMMIES/DHA & FA 0.4-32.5 MG Chew Chew 1 tablet daily by mouth.       Sharyon Cable CNM 03/11/2018, 10:10 AM

## 2018-03-31 ENCOUNTER — Encounter: Payer: Self-pay | Admitting: Obstetrics and Gynecology

## 2018-03-31 ENCOUNTER — Ambulatory Visit (INDEPENDENT_AMBULATORY_CARE_PROVIDER_SITE_OTHER): Payer: Medicaid Other | Admitting: Obstetrics and Gynecology

## 2018-03-31 DIAGNOSIS — O09892 Supervision of other high risk pregnancies, second trimester: Secondary | ICD-10-CM

## 2018-03-31 DIAGNOSIS — Z3A10 10 weeks gestation of pregnancy: Secondary | ICD-10-CM

## 2018-03-31 DIAGNOSIS — O34219 Maternal care for unspecified type scar from previous cesarean delivery: Secondary | ICD-10-CM | POA: Insufficient documentation

## 2018-03-31 DIAGNOSIS — Z3480 Encounter for supervision of other normal pregnancy, unspecified trimester: Secondary | ICD-10-CM

## 2018-03-31 DIAGNOSIS — O0990 Supervision of high risk pregnancy, unspecified, unspecified trimester: Secondary | ICD-10-CM | POA: Insufficient documentation

## 2018-03-31 DIAGNOSIS — O09899 Supervision of other high risk pregnancies, unspecified trimester: Secondary | ICD-10-CM | POA: Insufficient documentation

## 2018-03-31 MED ORDER — VITAFOL GUMMIES 3.33-0.333-34.8 MG PO CHEW: 2.0000 | CHEWABLE_TABLET | Freq: Every day | ORAL | 6 refills | Status: AC

## 2018-03-31 NOTE — Progress Notes (Signed)
  Subjective:    Victoria Solis is a [id number] [redacted]w[redacted]d being seen today for her first obstetrical visit.  Her obstetrical history is significant for previous LTCS followed by 2 successful VBAC, short interval between pregnancy with last VBAC 06/2017. Patient does intend to breast feed. Pregnancy history fully reviewed.  Patient reports no complaints.  Vitals:   03/31/18 1104  BP: 114/74  Pulse: 68  Weight: 164 lb 1.6 oz (74.4 kg)    HISTORY: OB History  Gravida Para Term Preterm AB Living  4 3 3     3   SAB TAB Ectopic Multiple Live Births        0 3    # Outcome Date GA Lbr Len/2nd Weight Sex Delivery Anes PTL Lv  4 Current           3 Term 07/23/17 [redacted]w[redacted]d 04:55 / 00:13 6 lb 14.4 oz (3.13 kg) M VBAC, Vacuum EPI  LIV  2 Term      Vag-Spont   LIV  1 Term      CS-Unspec   LIV   Past Medical History:  Diagnosis Date  . Migraine    Past Surgical History:  Procedure Laterality Date  . CESAREAN SECTION     Family History  Problem Relation Age of Onset  . Hypertension Mother   . Diabetes Mother   . Stroke Mother   . Kidney failure Mother   . Hypertension Father   . Diabetes Father      Exam    Uterus:   12-weeks  Pelvic Exam:    Perineum: No Hemorrhoids, Normal Perineum   Vulva: normal   Vagina:  normal mucosa, normal discharge   pH:    Cervix: multiparous appearance and cervix is closed and long   Adnexa: normal adnexa and no mass, fullness, tenderness   Bony Pelvis: gynecoid  System: Breast:  normal appearance, no masses or tenderness   Skin: normal coloration and turgor, no rashes    Neurologic: oriented, no focal deficits   Extremities: normal strength, tone, and muscle mass   HEENT extra ocular movement intact   Mouth/Teeth mucous membranes moist, pharynx normal without lesions and dental hygiene good   Neck supple and no masses   Cardiovascular: regular rate and rhythm   Respiratory:  appears well, vitals normal, no respiratory distress, acyanotic,  normal RR, chest clear, no wheezing, crepitations, rhonchi, normal symmetric air entry   Abdomen: soft, non-tender; bowel sounds normal; no masses,  no organomegaly   Urinary:       Assessment:    Pregnancy: [id number] Patient Active Problem List   Diagnosis Date Noted  . Supervision of other normal pregnancy, antepartum 03/31/2018  . Previous cesarean delivery affecting pregnancy 03/31/2018  . Short interval between pregnancies affecting pregnancy, antepartum 03/31/2018        Plan:     Initial labs drawn. Prenatal vitamins. Problem list reviewed and updated. Genetic Screening discussed : panorama ordered.  Ultrasound discussed; fetal survey: ordered.  Follow up in 4 weeks. 50% of 30 min visit spent on counseling and coordination of care.     [name] 03/31/2018

## 2018-03-31 NOTE — Patient Instructions (Signed)
° °First Trimester of Pregnancy °The first trimester of pregnancy is from week 1 until the end of week 13 (months 1 through 3). A week after a sperm fertilizes an egg, the egg will implant on the wall of the uterus. This embryo will begin to develop into a baby. Genes from you and your partner will form the baby. The female genes will determine whether the baby will be a boy or a girl. At 6-8 weeks, the eyes and face will be formed, and the heartbeat can be seen on ultrasound. At the end of 12 weeks, all the baby's organs will be formed. °Now that you are pregnant, you will want to do everything you can to have a healthy baby. Two of the most important things are to get good prenatal care and to follow your health care provider's instructions. Prenatal care is all the medical care you receive before the baby's birth. This care will help prevent, find, and treat any problems during the pregnancy and childbirth. °Body changes during your first trimester °Your body goes through many changes during pregnancy. The changes vary from woman to woman. °· You may gain or lose a couple of pounds at first. °· You may feel sick to your stomach (nauseous) and you may throw up (vomit). If the vomiting is uncontrollable, call your health care provider. °· You may tire easily. °· You may develop headaches that can be relieved by medicines. All medicines should be approved by your health care provider. °· You may urinate more often. Painful urination may mean you have a bladder infection. °· You may develop heartburn as a result of your pregnancy. °· You may develop constipation because certain hormones are causing the muscles that push stool through your intestines to slow down. °· You may develop hemorrhoids or swollen veins (varicose veins). °· Your breasts may begin to grow larger and become tender. Your nipples may stick out more, and the tissue that surrounds them (areola) may become darker. °· Your gums may bleed and may be  sensitive to brushing and flossing. °· Dark spots or blotches (chloasma, mask of pregnancy) may develop on your face. This will likely fade after the baby is born. °· Your menstrual periods will stop. °· You may have a loss of appetite. °· You may develop cravings for certain kinds of food. °· You may have changes in your emotions from day to day, such as being excited to be pregnant or being concerned that something may go wrong with the pregnancy and baby. °· You may have more vivid and strange dreams. °· You may have changes in your hair. These can include thickening of your hair, rapid growth, and changes in texture. Some women also have hair loss during or after pregnancy, or hair that feels dry or thin. Your hair will most likely return to normal after your baby is born. °What to expect at prenatal visits °During a routine prenatal visit: °· You will be weighed to make sure you and the baby are growing normally. °· Your blood pressure will be taken. °· Your abdomen will be measured to track your baby's growth. °· The fetal heartbeat will be listened to between weeks 10 and 14 of your pregnancy. °· Test results from any previous visits will be discussed. °Your health care provider may ask you: °· How you are feeling. °· If you are feeling the baby move. °· If you have had any abnormal symptoms, such as leaking fluid, bleeding, severe headaches, or   abdominal cramping. °· If you are using any tobacco products, including cigarettes, chewing tobacco, and electronic cigarettes. °· If you have any questions. °Other tests that may be performed during your first trimester include: °· Blood tests to find your blood type and to check for the presence of any previous infections. The tests will also be used to check for low iron levels (anemia) and protein on red blood cells (Rh antibodies). Depending on your risk factors, or if you previously had diabetes during pregnancy, you may have tests to check for high blood sugar  that affects pregnant women (gestational diabetes). °· Urine tests to check for infections, diabetes, or protein in the urine. °· An ultrasound to confirm the proper growth and development of the baby. °· Fetal screens for spinal cord problems (spina bifida) and Down syndrome. °· HIV (human immunodeficiency virus) testing. Routine prenatal testing includes screening for HIV, unless you choose not to have this test. °· You may need other tests to make sure you and the baby are doing well. °Follow these instructions at home: °Medicines °· Follow your health care provider's instructions regarding medicine use. Specific medicines may be either safe or unsafe to take during pregnancy. °· Take a prenatal vitamin that contains at least 600 micrograms (mcg) of folic acid. °· If you develop constipation, try taking a stool softener if your health care provider approves. °Eating and drinking ° °· Eat a balanced diet that includes fresh fruits and vegetables, whole grains, good sources of protein such as meat, eggs, or tofu, and low-fat dairy. Your health care provider will help you determine the amount of weight gain that is right for you. °· Avoid raw meat and uncooked cheese. These carry germs that can cause birth defects in the baby. °· Eating four or five small meals rather than three large meals a day may help relieve nausea and vomiting. If you start to feel nauseous, eating a few soda crackers can be helpful. Drinking liquids between meals, instead of during meals, also seems to help ease nausea and vomiting. °· Limit foods that are high in fat and processed sugars, such as fried and sweet foods. °· To prevent constipation: °? Eat foods that are high in fiber, such as fresh fruits and vegetables, whole grains, and beans. °? Drink enough fluid to keep your urine clear or pale yellow. °Activity °· Exercise only as directed by your health care provider. Most women can continue their usual exercise routine during  pregnancy. Try to exercise for 30 minutes at least 5 days a week. Exercising will help you: °? Control your weight. °? Stay in shape. °? Be prepared for labor and delivery. °· Experiencing pain or cramping in the lower abdomen or lower back is a good sign that you should stop exercising. Check with your health care provider before continuing with normal exercises. °· Try to avoid standing for long periods of time. Move your legs often if you must stand in one place for a long time. °· Avoid heavy lifting. °· Wear low-heeled shoes and practice good posture. °· You may continue to have sex unless your health care provider tells you not to. °Relieving pain and discomfort °· Wear a good support bra to relieve breast tenderness. °· Take warm sitz baths to soothe any pain or discomfort caused by hemorrhoids. Use hemorrhoid cream if your health care provider approves. °· Rest with your legs elevated if you have leg cramps or low back pain. °· If you develop varicose veins   in your legs, wear support hose. Elevate your feet for 15 minutes, 3-4 times a day. Limit salt in your diet. °Prenatal care °· Schedule your prenatal visits by the twelfth week of pregnancy. They are usually scheduled monthly at first, then more often in the last 2 months before delivery. °· Write down your questions. Take them to your prenatal visits. °· Keep all your prenatal visits as told by your health care provider. This is important. °Safety °· Wear your seat belt at all times when driving. °· Make a list of emergency phone numbers, including numbers for family, friends, the hospital, and police and fire departments. °General instructions °· Ask your health care provider for a referral to a local prenatal education class. Begin classes no later than the beginning of month 6 of your pregnancy. °· Ask for help if you have counseling or nutritional needs during pregnancy. Your health care provider can offer advice or refer you to specialists for help  with various needs. °· Do not use hot tubs, steam rooms, or saunas. °· Do not douche or use tampons or scented sanitary pads. °· Do not cross your legs for long periods of time. °· Avoid cat litter boxes and soil used by cats. These carry germs that can cause birth defects in the baby and possibly loss of the fetus by miscarriage or stillbirth. °· Avoid all smoking, herbs, alcohol, and medicines not prescribed by your health care provider. Chemicals in these products affect the formation and growth of the baby. °· Do not use any products that contain nicotine or tobacco, such as cigarettes and e-cigarettes. If you need help quitting, ask your health care provider. You may receive counseling support and other resources to help you quit. °· Schedule a dentist appointment. At home, brush your teeth with a soft toothbrush and be gentle when you floss. °Contact a health care provider if: °· You have dizziness. °· You have mild pelvic cramps, pelvic pressure, or nagging pain in the abdominal area. °· You have persistent nausea, vomiting, or diarrhea. °· You have a bad smelling vaginal discharge. °· You have pain when you urinate. °· You notice increased swelling in your face, hands, legs, or ankles. °· You are exposed to fifth disease or chickenpox. °· You are exposed to German measles (rubella) and have never had it. °Get help right away if: °· You have a fever. °· You are leaking fluid from your vagina. °· You have spotting or bleeding from your vagina. °· You have severe abdominal cramping or pain. °· You have rapid weight gain or loss. °· You vomit blood or material that looks like coffee grounds. °· You develop a severe headache. °· You have shortness of breath. °· You have any kind of trauma, such as from a fall or a car accident. °Summary °· The first trimester of pregnancy is from week 1 until the end of week 13 (months 1 through 3). °· Your body goes through many changes during pregnancy. The changes vary from  woman to woman. °· You will have routine prenatal visits. During those visits, your health care provider will examine you, discuss any test results you may have, and talk with you about how you are feeling. °This information is not intended to replace advice given to you by your health care provider. Make sure you discuss any questions you have with your health care provider. °Document Released: 01/09/2001 Document Revised: 12/28/2015 Document Reviewed: 12/28/2015 °[employer] Interactive Patient Education © 2019 [employer] ° ° °  Second Trimester of Pregnancy °The second trimester is from week 14 through week 27 (months 4 through 6). The second trimester is often a time when you feel your best. Your body has adjusted to being pregnant, and you begin to feel better physically. Usually, morning sickness has lessened or quit completely, you may have more energy, and you may have an increase in appetite. The second trimester is also a time when the fetus is growing rapidly. At the end of the sixth month, the fetus is about 9 inches long and weighs about 1½ pounds. You will likely begin to feel the baby move (quickening) between 16 and 20 weeks of pregnancy. °Body changes during your second trimester °Your body continues to go through many changes during your second trimester. The changes vary from woman to woman. °· Your weight will continue to increase. You will notice your lower abdomen bulging out. °· You may begin to get stretch marks on your hips, abdomen, and breasts. °· You may develop headaches that can be relieved by medicines. The medicines should be approved by your health care provider. °· You may urinate more often because the fetus is pressing on your bladder. °· You may develop or continue to have heartburn as a result of your pregnancy. °· You may develop constipation because certain hormones are causing the muscles that push waste through your intestines to slow down. °· You may develop hemorrhoids or  swollen, bulging veins (varicose veins). °· You may have back pain. This is caused by: °? Weight gain. °? Pregnancy hormones that are relaxing the joints in your pelvis. °? A shift in weight and the muscles that support your balance. °· Your breasts will continue to grow and they will continue to become tender. °· Your gums may bleed and may be sensitive to brushing and flossing. °· Dark spots or blotches (chloasma, mask of pregnancy) may develop on your face. This will likely fade after the baby is born. °· A dark line from your belly button to the pubic area (linea nigra) may appear. This will likely fade after the baby is born. °· You may have changes in your hair. These can include thickening of your hair, rapid growth, and changes in texture. Some women also have hair loss during or after pregnancy, or hair that feels dry or thin. Your hair will most likely return to normal after your baby is born. °What to expect at prenatal visits °During a routine prenatal visit: °· You will be weighed to make sure you and the fetus are growing normally. °· Your blood pressure will be taken. °· Your abdomen will be measured to track your baby's growth. °· The fetal heartbeat will be listened to. °· Any test results from the previous visit will be discussed. °Your health care provider may ask you: °· How you are feeling. °· If you are feeling the baby move. °· If you have had any abnormal symptoms, such as leaking fluid, bleeding, severe headaches, or abdominal cramping. °· If you are using any tobacco products, including cigarettes, chewing tobacco, and electronic cigarettes. °· If you have any questions. °Other tests that may be performed during your second trimester include: °· Blood tests that check for: °? Low iron levels (anemia). °? High blood sugar that affects pregnant women (gestational diabetes) between 24 and 28 weeks. °? Rh antibodies. This is to check for a protein on red blood cells (Rh factor). °· Urine tests  to check for infections, diabetes, or protein in   the urine. °· An ultrasound to confirm the proper growth and development of the baby. °· An amniocentesis to check for possible genetic problems. °· Fetal screens for spina bifida and Down syndrome. °· HIV (human immunodeficiency virus) testing. Routine prenatal testing includes screening for HIV, unless you choose not to have this test. °Follow these instructions at home: °Medicines °· Follow your health care provider's instructions regarding medicine use. Specific medicines may be either safe or unsafe to take during pregnancy. °· Take a prenatal vitamin that contains at least 600 micrograms (mcg) of folic acid. °· If you develop constipation, try taking a stool softener if your health care provider approves. °Eating and drinking ° °· Eat a balanced diet that includes fresh fruits and vegetables, whole grains, good sources of protein such as meat, eggs, or tofu, and low-fat dairy. Your health care provider will help you determine the amount of weight gain that is right for you. °· Avoid raw meat and uncooked cheese. These carry germs that can cause birth defects in the baby. °· If you have low calcium intake from food, talk to your health care provider about whether you should take a daily calcium supplement. °· Limit foods that are high in fat and processed sugars, such as fried and sweet foods. °· To prevent constipation: °? Drink enough fluid to keep your urine clear or pale yellow. °? Eat foods that are high in fiber, such as fresh fruits and vegetables, whole grains, and beans. °Activity °· Exercise only as directed by your health care provider. Most women can continue their usual exercise routine during pregnancy. Try to exercise for 30 minutes at least 5 days a week. Stop exercising if you experience uterine contractions. °· Avoid heavy lifting, wear low heel shoes, and practice good posture. °· A sexual relationship may be continued unless your health care  provider directs you otherwise. °Relieving pain and discomfort °· Wear a good support bra to prevent discomfort from breast tenderness. °· Take warm sitz baths to soothe any pain or discomfort caused by hemorrhoids. Use hemorrhoid cream if your health care provider approves. °· Rest with your legs elevated if you have leg cramps or low back pain. °· If you develop varicose veins, wear support hose. Elevate your feet for 15 minutes, 3-4 times a day. Limit salt in your diet. °Prenatal Care °· Write down your questions. Take them to your prenatal visits. °· Keep all your prenatal visits as told by your health care provider. This is important. °Safety °· Wear your seat belt at all times when driving. °· Make a list of emergency phone numbers, including numbers for family, friends, the hospital, and police and fire departments. °General instructions °· Ask your health care provider for a referral to a local prenatal education class. Begin classes no later than the beginning of month 6 of your pregnancy. °· Ask for help if you have counseling or nutritional needs during pregnancy. Your health care provider can offer advice or refer you to specialists for help with various needs. °· Do not use hot tubs, steam rooms, or saunas. °· Do not douche or use tampons or scented sanitary pads. °· Do not cross your legs for long periods of time. °· Avoid cat litter boxes and soil used by cats. These carry germs that can cause birth defects in the baby and possibly loss of the fetus by miscarriage or stillbirth. °· Avoid all smoking, herbs, alcohol, and unprescribed drugs. Chemicals in these products can affect the formation   and growth of the baby. °· Do not use any products that contain nicotine or tobacco, such as cigarettes and e-cigarettes. If you need help quitting, ask your health care provider. °· Visit your dentist if you have not gone yet during your pregnancy. Use a soft toothbrush to brush your teeth and be gentle when you  floss. °Contact a health care provider if: °· You have dizziness. °· You have mild pelvic cramps, pelvic pressure, or nagging pain in the abdominal area. °· You have persistent nausea, vomiting, or diarrhea. °· You have a bad smelling vaginal discharge. °· You have pain when you urinate. °Get help right away if: °· You have a fever. °· You are leaking fluid from your vagina. °· You have spotting or bleeding from your vagina. °· You have severe abdominal cramping or pain. °· You have rapid weight gain or weight loss. °· You have shortness of breath with chest pain. °· You notice sudden or extreme swelling of your face, hands, ankles, feet, or legs. °· You have not felt your baby move in over an hour. °· You have severe headaches that do not go away when you take medicine. °· You have vision changes. °Summary °· The second trimester is from week 14 through week 27 (months 4 through 6). It is also a time when the fetus is growing rapidly. °· Your body goes through many changes during pregnancy. The changes vary from woman to woman. °· Avoid all smoking, herbs, alcohol, and unprescribed drugs. These chemicals affect the formation and growth your baby. °· Do not use any tobacco products, such as cigarettes, chewing tobacco, and e-cigarettes. If you need help quitting, ask your health care provider. °· Contact your health care provider if you have any questions. Keep all prenatal visits as told by your health care provider. This is important. °This information is not intended to replace advice given to you by your health care provider. Make sure you discuss any questions you have with your health care provider. °Document Released: 01/09/2001 Document Revised: 02/21/2016 Document Reviewed: 02/21/2016 °[employer] Interactive Patient Education © 2019 [employer] ° ° °Contraception Choices °Contraception, also called birth control, refers to methods or devices that prevent pregnancy. °Hormonal methods °Contraceptive  implant ° °A contraceptive implant is a thin, plastic tube that contains a hormone. It is inserted into the upper part of the arm. It can remain in place for up to 3 years. °Progestin-only injections °Progestin-only injections are injections of progestin, a synthetic form of the hormone progesterone. They are given every 3 months by a health care provider. °Birth control pills ° °Birth control pills are pills that contain hormones that prevent pregnancy. They must be taken once a day, preferably at the same time each day. °Birth control patch ° °The birth control patch contains hormones that prevent pregnancy. It is placed on the skin and must be changed once a week for three weeks and removed on the fourth week. A prescription is needed to use this method of contraception. °Vaginal ring ° °A vaginal ring contains hormones that prevent pregnancy. It is placed in the vagina for three weeks and removed on the fourth week. After that, the process is repeated with a new ring. A prescription is needed to use this method of contraception. °Emergency contraceptive °Emergency contraceptives prevent pregnancy after unprotected sex. They come in pill form and can be taken up to 5 days after sex. They work best the sooner they are taken after having sex. Most emergency   contraceptives are available without a prescription. This method should not be used as your only form of birth control. °Barrier methods °Female condom ° °A female condom is a thin sheath that is worn over the penis during sex. Condoms keep sperm from going inside a woman's body. They can be used with a spermicide to increase their effectiveness. They should be disposed after a single use. °Female condom ° °A female condom is a soft, loose-fitting sheath that is put into the vagina before sex. The condom keeps sperm from going inside a woman's body. They should be disposed after a single use. °Diaphragm ° °A diaphragm is a soft, dome-shaped barrier. It is inserted  into the vagina before sex, along with a spermicide. The diaphragm blocks sperm from entering the uterus, and the spermicide kills sperm. A diaphragm should be left in the vagina for 6-8 hours after sex and removed within 24 hours. °A diaphragm is prescribed and fitted by a health care provider. A diaphragm should be replaced every 1-2 years, after giving birth, after gaining more than 15 lb (6.8 kg), and after pelvic surgery. °Cervical cap ° °A cervical cap is a round, soft latex or plastic cup that fits over the cervix. It is inserted into the vagina before sex, along with spermicide. It blocks sperm from entering the uterus. The cap should be left in place for 6-8 hours after sex and removed within 48 hours. A cervical cap must be prescribed and fitted by a health care provider. It should be replaced every 2 years. °Sponge ° °A sponge is a soft, circular piece of polyurethane foam with spermicide on it. The sponge helps block sperm from entering the uterus, and the spermicide kills sperm. To use it, you make it wet and then insert it into the vagina. It should be inserted before sex, left in for at least 6 hours after sex, and removed and thrown away within 30 hours. °Spermicides °Spermicides are chemicals that kill or block sperm from entering the cervix and uterus. They can come as a cream, jelly, suppository, foam, or tablet. A spermicide should be inserted into the vagina with an applicator at least 10-15 minutes before sex to allow time for it to work. The process must be repeated every time you have sex. Spermicides do not require a prescription. °Intrauterine contraception °Intrauterine device (IUD) °An IUD is a T-shaped device that is put in a woman's uterus. There are two types: °· Hormone IUD.This type contains progestin, a synthetic form of the hormone progesterone. This type can stay in place for 3-5 years. °· Copper IUD.This type is wrapped in copper wire. It can stay in place for 10  years. ° °Permanent methods of contraception °Female tubal ligation °In this method, a woman's fallopian tubes are sealed, tied, or blocked during surgery to prevent eggs from traveling to the uterus. °Hysteroscopic sterilization °In this method, a small, flexible insert is placed into each fallopian tube. The inserts cause scar tissue to form in the fallopian tubes and block them, so sperm cannot reach an egg. The procedure takes about 3 months to be effective. Another form of birth control must be used during those 3 months. °Female sterilization °This is a procedure to tie off the tubes that carry sperm (vasectomy). After the procedure, the man can still ejaculate fluid (semen). °Natural planning methods °Natural family planning °In this method, a couple does not have sex on days when the woman could become pregnant. °Calendar method °This means keeping track   of the length of each menstrual cycle, identifying the days when pregnancy can happen, and not having sex on those days. °Ovulation method °In this method, a couple avoids sex during ovulation. °Symptothermal method °This method involves not having sex during ovulation. The woman typically checks for ovulation by watching changes in her temperature and in the consistency of cervical mucus. °Post-ovulation method °In this method, a couple waits to have sex until after ovulation. °Summary °· Contraception, also called birth control, means methods or devices that prevent pregnancy. °· Hormonal methods of contraception include implants, injections, pills, patches, vaginal rings, and emergency contraceptives. °· Barrier methods of contraception can include female condoms, female condoms, diaphragms, cervical caps, sponges, and spermicides. °· There are two types of IUDs (intrauterine devices). An IUD can be put in a woman's uterus to prevent pregnancy for 3-5 years. °· Permanent sterilization can be done through a procedure for males, females, or both. °· Natural  family planning methods involve not having sex on days when the woman could become pregnant. °This information is not intended to replace advice given to you by your health care provider. Make sure you discuss any questions you have with your health care provider. °Document Released: 01/15/2005 Document Revised: 01/17/2017 Document Reviewed: 02/18/2016 °Elsevier Interactive Patient Education © 2019 Elsevier Inc. ° ° °Breastfeeding ° °Choosing to breastfeed is one of the best decisions you can make for yourself and your baby. A change in hormones during pregnancy causes your breasts to make breast milk in your milk-producing glands. Hormones prevent breast milk from being released before your baby is born. They also prompt milk flow after birth. Once breastfeeding has begun, thoughts of your baby, as well as his or her sucking or crying, can stimulate the release of milk from your milk-producing glands. °Benefits of breastfeeding °Research shows that breastfeeding offers many health benefits for infants and mothers. It also offers a cost-free and convenient way to feed your baby. °For your baby °· Your first milk (colostrum) helps your baby's digestive system to function better. °· Special cells in your milk (antibodies) help your baby to fight off infections. °· Breastfed babies are less likely to develop asthma, allergies, obesity, or type 2 diabetes. They are also at lower risk for sudden infant death syndrome (SIDS). °· Nutrients in breast milk are better able to meet your baby’s needs compared to infant formula. °· Breast milk improves your baby's brain development. °For you °· Breastfeeding helps to create a very special bond between you and your baby. °· Breastfeeding is convenient. Breast milk costs nothing and is always available at the correct temperature. °· Breastfeeding helps to burn calories. It helps you to lose the weight that you gained during pregnancy. °· Breastfeeding makes your uterus return  faster to its size before pregnancy. It also slows bleeding (lochia) after you give birth. °· Breastfeeding helps to lower your risk of developing type 2 diabetes, osteoporosis, rheumatoid arthritis, cardiovascular disease, and breast, ovarian, uterine, and endometrial cancer later in life. °Breastfeeding basics °Starting breastfeeding °· Find a comfortable place to sit or lie down, with your neck and back well-supported. °· Place a pillow or a rolled-up blanket under your baby to bring him or her to the level of your breast (if you are seated). Nursing pillows are specially designed to help support your arms and your baby while you breastfeed. °· Make sure that your baby's tummy (abdomen) is facing your abdomen. °· Gently massage your breast. With your fingertips, massage from the   outer edges of your breast inward toward the nipple. This encourages milk flow. If your milk flows slowly, you may need to continue this action during the feeding. °· Support your breast with 4 fingers underneath and your thumb above your nipple (make the letter "C" with your hand). Make sure your fingers are well away from your nipple and your baby’s mouth. °· Stroke your baby's lips gently with your finger or nipple. °· When your baby's mouth is open wide enough, quickly bring your baby to your breast, placing your entire nipple and as much of the areola as possible into your baby's mouth. The areola is the colored area around your nipple. °? More areola should be visible above your baby's upper lip than below the lower lip. °? Your baby's lips should be opened and extended outward (flanged) to ensure an adequate, comfortable latch. °? Your baby's tongue should be between his or her lower gum and your breast. °· Make sure that your baby's mouth is correctly positioned around your nipple (latched). Your baby's lips should create a seal on your breast and be turned out (everted). °· It is common for your baby to suck about 2-3 minutes in  order to start the flow of breast milk. °Latching °Teaching your baby how to latch onto your breast properly is very important. An improper latch can cause nipple pain, decreased milk supply, and poor weight gain in your baby. Also, if your baby is not latched onto your nipple properly, he or she may swallow some air during feeding. This can make your baby fussy. Burping your baby when you switch breasts during the feeding can help to get rid of the air. However, teaching your baby to latch on properly is still the best way to prevent fussiness from swallowing air while breastfeeding. °Signs that your baby has successfully latched onto your nipple °· Silent tugging or silent sucking, without causing you pain. Infant's lips should be extended outward (flanged). °· Swallowing heard between every 3-4 sucks once your milk has started to flow (after your let-down milk reflex occurs). °· Muscle movement above and in front of his or her ears while sucking. °Signs that your baby has not successfully latched onto your nipple °· Sucking sounds or smacking sounds from your baby while breastfeeding. °· Nipple pain. °If you think your baby has not latched on correctly, slip your finger into the corner of your baby’s mouth to break the suction and place it between your baby's gums. Attempt to start breastfeeding again. °Signs of successful breastfeeding °Signs from your baby °· Your baby will gradually decrease the number of sucks or will completely stop sucking. °· Your baby will fall asleep. °· Your baby's body will relax. °· Your baby will retain a small amount of milk in his or her mouth. °· Your baby will let go of your breast by himself or herself. °Signs from you °· Breasts that have increased in firmness, weight, and size 1-3 hours after feeding. °· Breasts that are softer immediately after breastfeeding. °· Increased milk volume, as well as a change in milk consistency and color by the fifth day of  breastfeeding. °· Nipples that are not sore, cracked, or bleeding. °Signs that your baby is getting enough milk °· Wetting at least 1-2 diapers during the first 24 hours after birth. °· Wetting at least 5-6 diapers every 24 hours for the first week after birth. The urine should be clear or pale yellow by the age of 5 days. °·   Wetting 6-8 diapers every 24 hours as your baby continues to grow and develop. °· At least 3 stools in a 24-hour period by the age of 5 days. The stool should be soft and yellow. °· At least 3 stools in a 24-hour period by the age of 7 days. The stool should be seedy and yellow. °· No loss of weight greater than 10% of birth weight during the first 3 days of life. °· Average weight gain of 4-7 oz (113-198 g) per week after the age of 4 days. °· Consistent daily weight gain by the age of 5 days, without weight loss after the age of 2 weeks. °After a feeding, your baby may spit up a small amount of milk. This is normal. °Breastfeeding frequency and duration °Frequent feeding will help you make more milk and can prevent sore nipples and extremely full breasts (breast engorgement). Breastfeed when you feel the need to reduce the fullness of your breasts or when your baby shows signs of hunger. This is called "breastfeeding on demand." Signs that your baby is hungry include: °· Increased alertness, activity, or restlessness. °· Movement of the head from side to side. °· Opening of the mouth when the corner of the mouth or cheek is stroked (rooting). °· Increased sucking sounds, smacking lips, cooing, sighing, or squeaking. °· Hand-to-mouth movements and sucking on fingers or hands. °· Fussing or crying. °Avoid introducing a pacifier to your baby in the first 4-6 weeks after your baby is born. After this time, you may choose to use a pacifier. Research has shown that pacifier use during the first year of a baby's life decreases the risk of sudden infant death syndrome (SIDS). °Allow your baby to feed  on each breast as long as he or she wants. When your baby unlatches or falls asleep while feeding from the first breast, offer the second breast. Because newborns are often sleepy in the first few weeks of life, you may need to awaken your baby to get him or her to feed. °Breastfeeding times will vary from baby to baby. However, the following rules can serve as a guide to help you make sure that your baby is properly fed: °· Newborns (babies 4 weeks of age or younger) may breastfeed every 1-3 hours. °· Newborns should not go without breastfeeding for longer than 3 hours during the day or 5 hours during the night. °· You should breastfeed your baby a minimum of 8 times in a 24-hour period. °Breast milk pumping ° °  ° °Pumping and storing breast milk allows you to make sure that your baby is exclusively fed your breast milk, even at times when you are unable to breastfeed. This is especially important if you go back to work while you are still breastfeeding, or if you are not able to be present during feedings. Your lactation consultant can help you find a method of pumping that works best for you and give you guidelines about how long it is safe to store breast milk. °Caring for your breasts while you breastfeed °Nipples can become dry, cracked, and sore while breastfeeding. The following recommendations can help keep your breasts moisturized and healthy: °· Avoid using soap on your nipples. °· Wear a supportive bra designed especially for nursing. Avoid wearing underwire-style bras or extremely tight bras (sports bras). °· Air-dry your nipples for 3-4 minutes after each feeding. °· Use only cotton bra pads to absorb leaked breast milk. Leaking of breast milk between feedings is normal. °·   Use lanolin on your nipples after breastfeeding. Lanolin helps to maintain your skin's normal moisture barrier. Pure lanolin is not harmful (not toxic) to your baby. You may also hand express a few drops of breast milk and gently  massage that milk into your nipples and allow the milk to air-dry. °In the first few weeks after giving birth, some women experience breast engorgement. Engorgement can make your breasts feel heavy, warm, and tender to the touch. Engorgement peaks within 3-5 days after you give birth. The following recommendations can help to ease engorgement: °· Completely empty your breasts while breastfeeding or pumping. You may want to start by applying warm, moist heat (in the shower or with warm, water-soaked hand towels) just before feeding or pumping. This increases circulation and helps the milk flow. If your baby does not completely empty your breasts while breastfeeding, pump any extra milk after he or she is finished. °· Apply ice packs to your breasts immediately after breastfeeding or pumping, unless this is too uncomfortable for you. To do this: °? Put ice in a plastic bag. °? Place a towel between your skin and the bag. °? Leave the ice on for 20 minutes, 2-3 times a day. °· Make sure that your baby is latched on and positioned properly while breastfeeding. °If engorgement persists after 48 hours of following these recommendations, contact your health care provider or a lactation consultant. °Overall health care recommendations while breastfeeding °· Eat 3 healthy meals and 3 snacks every day. Well-nourished mothers who are breastfeeding need an additional 450-500 calories a day. You can meet this requirement by increasing the amount of a balanced diet that you eat. °· Drink enough water to keep your urine pale yellow or clear. °· Rest often, relax, and continue to take your prenatal vitamins to prevent fatigue, stress, and low vitamin and mineral levels in your body (nutrient deficiencies). °· Do not use any products that contain nicotine or tobacco, such as cigarettes and e-cigarettes. Your baby may be harmed by chemicals from cigarettes that pass into breast milk and exposure to secondhand smoke. If you need help  quitting, ask your health care provider. °· Avoid alcohol. °· Do not use illegal drugs or marijuana. °· Talk with your health care provider before taking any medicines. These include over-the-counter and prescription medicines as well as vitamins and herbal supplements. Some medicines that may be harmful to your baby can pass through breast milk. °· It is possible to become pregnant while breastfeeding. If birth control is desired, ask your health care provider about options that will be safe while breastfeeding your baby. °Where to find more information: °[employer]: www.llli.org °Contact a health care provider if: °· You feel like you want to stop breastfeeding or have become frustrated with breastfeeding. °· Your nipples are cracked or bleeding. °· Your breasts are red, tender, or warm. °· You have: °? Painful breasts or nipples. °? A swollen area on either breast. °? A fever or chills. °? Nausea or vomiting. °? Drainage other than breast milk from your nipples. °· Your breasts do not become full before feedings by the fifth day after you give birth. °· You feel sad and depressed. °· Your baby is: °? Too sleepy to eat well. °? Having trouble sleeping. °? More than 1 week old and wetting fewer than 6 diapers in a 24-hour period. °? Not gaining weight by 5 days of age. °· Your baby has fewer than 3 stools in a 24-hour period. °·   Your baby's skin or the white parts of his or her eyes become yellow. °Get help right away if: °· Your baby is overly tired (lethargic) and does not want to wake up and feed. °· Your baby develops an unexplained fever. °Summary °· Breastfeeding offers many health benefits for infant and mothers. °· Try to breastfeed your infant when he or she shows early signs of hunger. °· Gently tickle or stroke your baby's lips with your finger or nipple to allow the baby to open his or her mouth. Bring the baby to your breast. Make sure that much of the areola is in your baby's mouth.  Offer one side and burp the baby before you offer the other side. °· Talk with your health care provider or lactation consultant if you have questions or you face problems as you breastfeed. °This information is not intended to replace advice given to you by your health care provider. Make sure you discuss any questions you have with your health care provider. °Document Released: 01/15/2005 Document Revised: 02/17/2016 Document Reviewed: 02/17/2016 °Elsevier Interactive Patient Education © 2019 Elsevier Inc. ° ° °

## 2018-03-31 NOTE — Progress Notes (Signed)
Pt presents for NOB visit. This is a not planned pregnancy, FOB is not involved. No complaints today per pt.

## 2018-04-01 LAB — OBSTETRIC PANEL, INCLUDING HIV
Antibody Screen: NEGATIVE
BASOS: 0 %
Basophils Absolute: 0 10*3/uL (ref 0.0–0.2)
EOS (ABSOLUTE): 0 10*3/uL (ref 0.0–0.4)
Eos: 0 %
HEMATOCRIT: 32.8 % — AB (ref 34.0–46.6)
HIV Screen 4th Generation wRfx: NONREACTIVE
Hemoglobin: 11.5 g/dL (ref 11.1–15.9)
Hepatitis B Surface Ag: NEGATIVE
Immature Grans (Abs): 0 10*3/uL (ref 0.0–0.1)
Immature Granulocytes: 0 %
LYMPHS: 25 %
Lymphocytes Absolute: 1.7 10*3/uL (ref 0.7–3.1)
MCH: 30.4 pg (ref 26.6–33.0)
MCHC: 35.1 g/dL (ref 31.5–35.7)
MCV: 87 fL (ref 79–97)
Monocytes Absolute: 0.5 10*3/uL (ref 0.1–0.9)
Monocytes: 7 %
Neutrophils Absolute: 4.7 10*3/uL (ref 1.4–7.0)
Neutrophils: 68 %
Platelets: 225 10*3/uL (ref 150–450)
RBC: 3.78 x10E6/uL (ref 3.77–5.28)
RDW: 12.9 % (ref 11.7–15.4)
RPR Ser Ql: NONREACTIVE
Rh Factor: POSITIVE
Rubella Antibodies, IGG: 1.56 index (ref >0.99)
WBC: 7 10*3/uL (ref 3.4–10.8)

## 2018-04-02 LAB — CULTURE, OB URINE

## 2018-04-02 LAB — URINE CULTURE, OB REFLEX

## 2018-04-07 ENCOUNTER — Telehealth: Payer: Self-pay | Admitting: *Deleted

## 2018-04-07 NOTE — Telephone Encounter (Signed)
Patient called requesting her results. Please advise  

## 2018-04-08 LAB — SMN1 COPY NUMBER ANALYSIS (SMA CARRIER SCREENING)

## 2018-04-08 LAB — HEMOGLOBINOPATHY EVALUATION
HGB A: 97.6 % (ref 96.4–98.8)
HGB C: 0 %
HGB S: 0 %
HGB VARIANT: 0 %
Hemoglobin A2 Quantitation: 2.4 % (ref 1.8–3.2)
Hemoglobin F Quantitation: 0 % (ref 0.0–2.0)

## 2018-04-08 LAB — CYSTIC FIBROSIS MUTATION 97: Interpretation: NOT DETECTED

## 2018-04-28 ENCOUNTER — Encounter: Payer: Self-pay | Admitting: Obstetrics and Gynecology

## 2018-04-28 ENCOUNTER — Encounter: Payer: Self-pay | Admitting: *Deleted

## 2018-04-28 ENCOUNTER — Ambulatory Visit (INDEPENDENT_AMBULATORY_CARE_PROVIDER_SITE_OTHER): Payer: Medicaid Other | Admitting: Obstetrics and Gynecology

## 2018-04-28 DIAGNOSIS — O09892 Supervision of other high risk pregnancies, second trimester: Secondary | ICD-10-CM | POA: Diagnosis not present

## 2018-04-28 DIAGNOSIS — O34219 Maternal care for unspecified type scar from previous cesarean delivery: Secondary | ICD-10-CM

## 2018-04-28 DIAGNOSIS — Z3A14 14 weeks gestation of pregnancy: Secondary | ICD-10-CM

## 2018-04-28 DIAGNOSIS — Z3480 Encounter for supervision of other normal pregnancy, unspecified trimester: Secondary | ICD-10-CM

## 2018-04-28 DIAGNOSIS — O09899 Supervision of other high risk pregnancies, unspecified trimester: Secondary | ICD-10-CM

## 2018-04-28 NOTE — Progress Notes (Signed)
Pt states she needs new letter stating she may not push, pull more than 20lbs.  Pt states that she is currently working in an [employer]. Letter composed and will be left at front desk for pick up  Pt states that she doesn't feel that she is eating enough, is concerned.  Pt states that she may be able to use her moms BP cuff in order to enter into [facility]. Pt advised that she may call at any time with questions/concerns.

## 2018-04-28 NOTE — Progress Notes (Signed)
   TELEHEALTH VIRTUAL OBSTETRICS VISIT ENCOUNTER NOTE  I connected with Darlin Coco on 04/28/18 at 10:45 AM EDT by telephone at home and verified that I am speaking with the correct person using two identifiers.   I discussed the limitations, risks, security and privacy concerns of performing an evaluation and management service by telephone and the availability of in person appointments. I also discussed with the patient that there may be a patient responsible charge related to this service. The patient expressed understanding and agreed to proceed.  Subjective:  Victoria Solis is a 28 y.o. [phone] at [redacted]w[redacted]d being followed for ongoing prenatal care.  She is currently monitored for the following issues for this low-risk pregnancy and has Supervision of other normal pregnancy, antepartum; Previous cesarean delivery affecting pregnancy; and Short interval between pregnancies affecting pregnancy, antepartum on their problem list.  Patient reports no complaints. Reports fetal movement. Denies any contractions, bleeding or leaking of fluid.   The following portions of the patient's history were reviewed and updated as appropriate: allergies, current medications, past family history, past medical history, past social history, past surgical history and problem list.   Objective:   General:  Alert, oriented and cooperative.   Mental Status: Normal mood and affect perceived. Normal judgment and thought content.  Rest of physical exam deferred due to type of encounter  Assessment and Plan:  Pregnancy: G4P3003 at [redacted]w[redacted]d 1. Supervision of other normal pregnancy, antepartum Patient is doing well without complaints Initial ob labs reviewed with the patient Anatomy ultrasound scheduled Patient understands that her next visit will likely be a telehealth visit pending status of corona virus epidemic Patient states she has access to a blood pressure cuff through her mother Patient feels that  she is not eating enough. She describes consuming 3 meals a day. Discussed with patient total weight gain no more than 25lb. She should eat to satisfy her hunger and to consume foods with nutritional value   2. Previous cesarean delivery affecting pregnancy Patient is interested in another TOLAC  3. Short interval between pregnancies affecting pregnancy, antepartum   Preterm labor symptoms and general obstetric precautions including but not limited to vaginal bleeding, contractions, leaking of fluid and fetal movement were reviewed in detail with the patient.  I discussed the assessment and treatment plan with the patient. The patient was provided an opportunity to ask questions and all were answered. The patient agreed with the plan and demonstrated an understanding of the instructions. The patient was advised to call back or seek an in-person office evaluation/go to MAU at [facility] for any urgent or concerning symptoms. Please refer to After Visit Summary for other counseling recommendations.   I provided 15 minutes of non-face-to-face time during this encounter.  Return in about 4 weeks (around 05/26/2018) for ROB, telehealth.  Future Appointments  Date Time Provider Department Center  05/27/2018 10:15 AM WH-MFC [place] 4 WH-MFCUS MFC-US     Catalina Antigua, MD Center for [facility], [facility]

## 2018-05-26 ENCOUNTER — Other Ambulatory Visit: Payer: Self-pay

## 2018-05-26 ENCOUNTER — Ambulatory Visit (INDEPENDENT_AMBULATORY_CARE_PROVIDER_SITE_OTHER): Payer: Medicaid Other | Admitting: Obstetrics & Gynecology

## 2018-05-26 DIAGNOSIS — Z3482 Encounter for supervision of other normal pregnancy, second trimester: Secondary | ICD-10-CM

## 2018-05-26 DIAGNOSIS — Z3480 Encounter for supervision of other normal pregnancy, unspecified trimester: Secondary | ICD-10-CM

## 2018-05-26 DIAGNOSIS — Z3A18 18 weeks gestation of pregnancy: Secondary | ICD-10-CM

## 2018-05-26 NOTE — Progress Notes (Signed)
   TELEHEALTH [facility] OBSTETRICS VISIT ENCOUNTER NOTE  I connected with@ on 05/26/18 at 10:00 AM EDT via WebEx at home and verified that I am speaking with the correct person using two identifiers.   I discussed the limitations, risks, security and privacy concerns of performing an evaluation and management service by telephone and the availability of in person appointments. I also discussed with the patient that there may be a patient responsible charge related to this service. The patient expressed understanding and agreed to proceed.  Subjective:  Victoria Solis is a 28 y.o. [phone] at [redacted]w[redacted]d being followed for ongoing prenatal care.  She is currently monitored for the following issues for this low-risk pregnancy and has Supervision of other normal pregnancy, antepartum; Previous cesarean delivery affecting pregnancy; and Short interval between pregnancies affecting pregnancy, antepartum on their problem list.  Patient reports no complaints. Reports fetal movement. Denies any contractions, bleeding or leaking of fluid.   The following portions of the patient's history were reviewed and updated as appropriate: allergies, current medications, past family history, past medical history, past social history, past surgical history and problem list.   Objective:  LMP  (LMP Unknown)  General:  Alert, oriented and cooperative. Patient is in no acute distress.  Mental Status: Normal mood and affect. Normal behavior. Normal judgment and thought content.   Respiratory: Normal respiratory effort noted, no problems with respiration noted  Rest of physical exam deferred due to type of encounter  Assessment and Plan:  Pregnancy: G4P3003 at [redacted]w[redacted]d 1. Supervision of other normal pregnancy, antepartum Anatomy scan scheduled for tomorrow, reminded patient. Will get AFP screen tomorrow at the [facility] had low risk NIPS. In the process of obtaining BP cuff, will do weekly BP and weight checks  (grandmother has scale). Also encouraged to sign up for MyChart; code sent to her via text. - AFP, Serum, Open Spina Bifida; Future No other complaints or concerns.  Routine obstetric precautions reviewed.  I discussed the assessment and treatment plan with the patient. The patient was provided an opportunity to ask questions and all were answered. The patient agreed with the plan and demonstrated an understanding of the instructions. The patient was advised to call back or seek an in-person office evaluation/go to MAU at [facility] for any urgent or concerning symptoms. Please refer to After Visit Summary for other counseling recommendations.   Return in about 4 weeks (around 06/23/2018) for Virtual OB Visit.  Future Appointments  Date Time Provider Department Center  05/27/2018 10:15 AM WH-MFC [place] 4 WH-MFCUS MFC-US    Jaynie Collins, MD Center for [facility], [facility]

## 2018-05-26 NOTE — Patient Instructions (Addendum)
Return to office for any scheduled appointments. Call the office or go to the MAU at [facility] at  if:  You begin to have strong, frequent contractions  Your water breaks.  Sometimes it is a big gush of fluid, sometimes it is just a trickle that keeps getting your panties wet or running down your legs  You have vaginal bleeding.  It is normal to have a small amount of spotting if your cervix was checked.   Any other obstetric concerns.   

## 2018-05-26 NOTE — Progress Notes (Signed)
Webex ROB: No complaints per pt Will order BP today

## 2018-05-27 ENCOUNTER — Other Ambulatory Visit (HOSPITAL_COMMUNITY): Payer: Self-pay | Admitting: *Deleted

## 2018-05-27 ENCOUNTER — Ambulatory Visit (HOSPITAL_COMMUNITY)
Admission: RE | Admit: 2018-05-27 | Discharge: 2018-05-27 | Disposition: A | Discharge status: Home / Self Care | Payer: Medicaid Other | Source: Ambulatory Visit | Attending: Obstetrics and Gynecology | Admitting: Obstetrics and Gynecology

## 2018-05-27 DIAGNOSIS — Z362 Encounter for other antenatal screening follow-up: Secondary | ICD-10-CM

## 2018-05-27 DIAGNOSIS — Z363 Encounter for antenatal screening for malformations: Secondary | ICD-10-CM

## 2018-05-27 DIAGNOSIS — Z3480 Encounter for supervision of other normal pregnancy, unspecified trimester: Secondary | ICD-10-CM | POA: Insufficient documentation

## 2018-06-24 ENCOUNTER — Ambulatory Visit (INDEPENDENT_AMBULATORY_CARE_PROVIDER_SITE_OTHER): Payer: Medicaid Other | Admitting: Obstetrics

## 2018-06-24 ENCOUNTER — Other Ambulatory Visit: Payer: Self-pay

## 2018-06-24 ENCOUNTER — Encounter: Payer: Self-pay | Admitting: Obstetrics

## 2018-06-24 DIAGNOSIS — Z3480 Encounter for supervision of other normal pregnancy, unspecified trimester: Secondary | ICD-10-CM

## 2018-06-24 DIAGNOSIS — Z3A23 23 weeks gestation of pregnancy: Secondary | ICD-10-CM

## 2018-06-24 DIAGNOSIS — O09892 Supervision of other high risk pregnancies, second trimester: Secondary | ICD-10-CM

## 2018-06-24 DIAGNOSIS — O09899 Supervision of other high risk pregnancies, unspecified trimester: Secondary | ICD-10-CM

## 2018-06-24 DIAGNOSIS — O34219 Maternal care for unspecified type scar from previous cesarean delivery: Secondary | ICD-10-CM

## 2018-06-24 MED ORDER — BLOOD PRESSURE MONITOR MISC: 1.0000 | 0 refills | Status: DC

## 2018-06-24 NOTE — Progress Notes (Signed)
ROB/Webex. 

## 2018-06-24 NOTE — Progress Notes (Signed)
TELEHEALTH OBSTETRICS PRENATAL VIRTUAL VIDEO VISIT ENCOUNTER NOTE  Provider location: [facility] [place]   I connected with Darlin Coco on 06/24/18 at  1:45 PM EDT by WebEx OB MyChart Video Encounter at home and verified that I am speaking with the correct person using two identifiers.   I discussed the limitations, risks, security and privacy concerns of performing an evaluation and management service by telephone and the availability of in person appointments. I also discussed with the patient that there may be a patient responsible charge related to this service. The patient expressed understanding and agreed to proceed. Subjective:  Victoria Solis is a 28 y.o. [phone] at [redacted]w[redacted]d being seen today for ongoing prenatal care.  She is currently monitored for the following issues for this low-risk pregnancy and has Supervision of other normal pregnancy, antepartum; Previous cesarean delivery affecting pregnancy; and Short interval between pregnancies affecting pregnancy, antepartum on their problem list.  Patient reports no complaints.  Contractions: Not present. Vag. Bleeding: None.  Movement: Present. Denies any leaking of fluid.   The following portions of the patient's history were reviewed and updated as appropriate: allergies, current medications, past family history, past medical history, past social history, past surgical history and problem list.   Objective:  There were no vitals filed for this visit.  Fetal Status:     Movement: Present     General:  Alert, oriented and cooperative. Patient is in no acute distress.  Respiratory: Normal respiratory effort, no problems with respiration noted  Mental Status: Normal mood and affect. Normal behavior. Normal judgment and thought content.  Rest of physical exam deferred due to type of encounter  Imaging: [place] Mfm Ob Comp + 14 Wk  Result Date: 05/27/2018  ----------------------------------------------------------------------  OBSTETRICS REPORT                       (Signed Final 05/27/2018 03:29 pm) ---------------------------------------------------------------------- Patient Info  ID #:       [id number]                          D.O.B.:  07-Jul-1990 (28 yrs)  Name:       Victoria Solis                     Visit Date: 05/27/2018 10:32 am              [place] ---------------------------------------------------------------------- Performed By  Performed By:     Percell Boston          Ref. Address:     [address]                                                             [address] [id number]  [place]                                                             [address]  Attending:        Lin Landsman      [facility]  Referred By:      [facility] ---------------------------------------------------------------------- Orders   #  Description                          Code         Ordered By   1  [place] MFM OB COMP + 14 WK               76805.01     PEGGY CONSTANT  ----------------------------------------------------------------------   #  Order #                    Accession #                 Episode #   1  [id number]                  [id number]                  [id number]  ---------------------------------------------------------------------- Indications   [redacted] weeks gestation of pregnancy                Z3A.19   Encounter for antenatal screening for          Z36.3   malformations (low risk NIPS)   Previous cesarean delivery, antepartum         O34.219   Short interval between pregancies, 2nd         O09.892   trimester  ---------------------------------------------------------------------- Fetal Evaluation  Num Of Fetuses:         1  Fetal  Heart Rate(bpm):  130  Cardiac Activity:       Observed  Presentation:           Breech  Placenta:               Posterior  P. Cord Insertion:      Visualized, central  Amniotic Fluid  AFI FV:      Within normal limits                              Largest Pocket(cm)                              7.27 ---------------------------------------------------------------------- Biometry  BPD:        45  mm     G. Age:  19w 4d         76  %    CI:  76.59   %    70 - 86                                                          FL/HC:      17.2   %    16.1 - 18.3  HC:      162.9  mm     G. Age:  19w 1d         46  %    HC/AC:      1.21        1.09 - 1.39  AC:       135   mm     G. Age:  19w 0d         45  %    FL/BPD:     62.2   %  FL:         28  mm     G. Age:  18w 4d         29  %    FL/AC:      20.7   %    20 - 24  HUM:        28  mm     G. Age:  19w 0d         50  %  CER:      18.8  mm     G. Age:  18w 3d         32  %  NFT:       2.2  mm  CM:        5.5  mm  Est. FW:     260  gm      0 lb 9 oz     41  % ---------------------------------------------------------------------- OB History  Gravidity:    4         Term:   3        Prem:   0        SAB:   0  TOP:          0       Ectopic:  0        Living: 3 ---------------------------------------------------------------------- Gestational Age  U/S Today:     19w 1d                                        EDD:   10/20/18  Best:          19w 0d     Det. ByMarcella Dubs:  Early Ultrasound         EDD:   10/21/18                                      (03/10/18) ---------------------------------------------------------------------- Anatomy  Cranium:               Appears normal         Aortic Arch:            Appears normal  Cavum:  Appears normal         Ductal Arch:            Appears normal  Ventricles:            Appears normal         Diaphragm:              Appears normal  Choroid Plexus:        Appears normal         Stomach:                Appears normal, left                                                                         sided  Cerebellum:            Appears normal         Abdomen:                Appears normal  Posterior Fossa:       Appears normal         Abdominal Wall:         Appears nml (cord                                                                        insert, abd wall)  Nuchal Fold:           Appears normal         Cord Vessels:           Appears normal (3                                                                        vessel cord)  Face:                  Appears normal         Kidneys:                Appear normal                         (orbits and profile)  Lips:                  Appears normal         Bladder:                Appears normal  Thoracic:              Appears normal         Spine:                  Not well  visualized  Heart:                 Not well visualized    Upper Extremities:      Appears normal  RVOT:                  Not well visualized    Lower Extremities:      Appears normal  LVOT:                  Not well visualized  Other:  Fetus appears to be a female. Heels visualized. Technically difficult due          to maternal habitus and fetal position. ---------------------------------------------------------------------- Cervix Uterus Adnexa  Cervix  Length:            3.4  cm.  Normal appearance by transabdominal scan.  Uterus  No abnormality visualized.  Left Ovary  No adnexal mass visualized.  Right Ovary  No adnexal mass visualized.  Cul De Sac  No free fluid seen.  Adnexa  No abnormality visualized. ---------------------------------------------------------------------- Impression  Normal interval growth.  No ultrasonic evidence of structural  fetal anomalies.  Suboptimal views of the fetal anatomy obtained secondary to  fetal position. ---------------------------------------------------------------------- Recommendations  Follow up anatomy scheduled in 4 weeks.  ----------------------------------------------------------------------               Lin Landsman, MD Electronically Signed Final Report   05/27/2018 03:29 pm ----------------------------------------------------------------------   Assessment and Plan:  Pregnancy: [id number] at [redacted]w[redacted]d 1. Supervision of other normal pregnancy, antepartum Rx: - Blood Pressure Monitor MISC; 1 each by Does not apply route once a week. Check Blood Pressure weekly.  SW:H67.59       Z13.6  Dispense: 1 each; Refill: 0  2. Previous cesarean delivery affecting pregnancy  3. Short interval between pregnancies affecting pregnancy, antepartum    Preterm labor symptoms and general obstetric precautions including but not limited to vaginal bleeding, contractions, leaking of fluid and fetal movement were reviewed in detail with the patient. I discussed the assessment and treatment plan with the patient. The patient was provided an opportunity to ask questions and all were answered. The patient agreed with the plan and demonstrated an understanding of the instructions. The patient was advised to call back or seek an in-person office evaluation/go to [facility] at [facility] for any urgent or concerning symptoms. Please refer to After Visit Summary for other counseling recommendations.   I provided 10 minutes of face-to-face time during this encounter.  Return in about 4 weeks (around 07/22/2018) for ROB, 2 hour OGTT.  Future Appointments  Date Time Provider Department Center  06/25/2018 11:15 AM WH-MFC [place] 4 WH-MFCUS MFC-US    Coral Ceo, MD [facility], [facility] 06-24-2018

## 2018-06-25 ENCOUNTER — Ambulatory Visit (HOSPITAL_COMMUNITY)
Admission: RE | Admit: 2018-06-25 | Discharge: 2018-06-25 | Disposition: A | Discharge status: Home / Self Care | Payer: Medicaid Other | Source: Ambulatory Visit | Attending: Obstetrics and Gynecology | Admitting: Obstetrics and Gynecology

## 2018-06-25 DIAGNOSIS — Z362 Encounter for other antenatal screening follow-up: Secondary | ICD-10-CM | POA: Diagnosis present

## 2018-06-25 DIAGNOSIS — O34219 Maternal care for unspecified type scar from previous cesarean delivery: Secondary | ICD-10-CM | POA: Diagnosis not present

## 2018-06-25 DIAGNOSIS — Z3A23 23 weeks gestation of pregnancy: Secondary | ICD-10-CM | POA: Diagnosis not present

## 2018-06-25 DIAGNOSIS — O09892 Supervision of other high risk pregnancies, second trimester: Secondary | ICD-10-CM

## 2018-07-25 ENCOUNTER — Other Ambulatory Visit (HOSPITAL_COMMUNITY)
Admission: RE | Admit: 2018-07-25 | Discharge: 2018-07-25 | Disposition: A | Discharge status: Home / Self Care | Payer: Medicaid Other | Source: Ambulatory Visit

## 2018-07-25 ENCOUNTER — Other Ambulatory Visit: Payer: Self-pay

## 2018-07-25 ENCOUNTER — Ambulatory Visit (INDEPENDENT_AMBULATORY_CARE_PROVIDER_SITE_OTHER): Payer: Medicaid Other

## 2018-07-25 ENCOUNTER — Other Ambulatory Visit: Payer: Medicaid Other

## 2018-07-25 VITALS — BP 102/64 | HR 69 | Wt 169.2 lb

## 2018-07-25 DIAGNOSIS — O24419 Gestational diabetes mellitus in pregnancy, unspecified control: Secondary | ICD-10-CM

## 2018-07-25 DIAGNOSIS — Z23 Encounter for immunization: Secondary | ICD-10-CM | POA: Diagnosis not present

## 2018-07-25 DIAGNOSIS — Z3A27 27 weeks gestation of pregnancy: Secondary | ICD-10-CM

## 2018-07-25 DIAGNOSIS — O26892 Other specified pregnancy related conditions, second trimester: Secondary | ICD-10-CM

## 2018-07-25 DIAGNOSIS — N898 Other specified noninflammatory disorders of vagina: Secondary | ICD-10-CM | POA: Insufficient documentation

## 2018-07-25 DIAGNOSIS — Z3480 Encounter for supervision of other normal pregnancy, unspecified trimester: Secondary | ICD-10-CM

## 2018-07-25 NOTE — Patient Instructions (Signed)
Postpartum Tubal Ligation Postpartum tubal ligation (PPTL) is a procedure to close the fallopian tubes. This is done so that you cannot get pregnant. When the fallopian tubes are closed, the eggs that the ovaries release cannot enter the uterus, and sperm cannot reach the eggs. PPTL is done right after childbirth or 1-2 days after childbirth, before the uterus returns to its normal location. If you have a cesarean section, it can be performed at the same time as the procedure. Having this done after childbirth does not make your stay in the hospital longer. PPTL is sometimes called "getting your tubes tied." You should not have this procedure if you want to get pregnant again or if you are unsure about having more children. Tell a health care provider about:  Any allergies you have.  All medicines you are taking, including vitamins, herbs, eye drops, creams, and over-the-counter medicines.  Any problems you or family members have had with anesthetic medicines.  Any blood disorders you have.  Any surgeries you have had.  Any medical conditions you have or have had.  Any past pregnancies. What are the risks? Generally, this is a safe procedure. However, problems may occur, including:  Infection.  Bleeding.  Injury to other organs in the abdomen.  Side effects from anesthetic medicines.  Failure of the procedure. If this happens, you could get pregnant.  Having a fertilized egg attach outside the uterus (ectopic pregnancy). What happens before the procedure?  Ask your health care provider about: ? How much pain you can expect to have. ? What medicines you will be given for pain, especially if you are planning to breastfeed. What happens during the procedure? If you had a vaginal delivery:  You will be given one or more of the following: ? A medicine to help you relax (sedative). ? A medicine to numb the area (local anesthetic). ? A medicine to make you fall asleep (general  anesthetic). ? A medicine that is injected into an area of your body to numb everything below the injection site (regional anesthetic).  If you have been given a general anesthetic, a tube will be put down your throat to help you breathe.  An IV will be inserted into one of your veins.  Your bladder may be emptied with a small tube (catheter).  An incision will be made just below your belly button.  Your fallopian tubes will be located and brought up through the incision.  Your fallopian tubes will be tied off, burned (cauterized), or blocked with a clip, ring, or clamp. A small part in the center of each fallopian tube may be removed.  The incision will be closed with stitches (sutures).  A bandage (dressing) will be placed over the incision. If you had a cesarean delivery:  Tubal ligation will be done through the incision that was used for the cesarean delivery of your baby.  The incision will be closed with sutures.  A dressing will be placed over the incision. The procedure may vary among health care providers and hospitals. What happens after the procedure?  Your blood pressure, heart rate, breathing rate, and blood oxygen level will be monitored until you leave the hospital.  You will be given pain medicine as needed.  Do not drive for 24 hours if you were given a sedative during your procedure. Summary  Postpartum tubal ligation is a procedure that closes the fallopian tubes so you cannot get pregnant anymore.  This procedure is done while you are still   in the hospital after childbirth. If you have a cesarean section, it can be performed at the same time.  Having this done after childbirth does not make your stay in the hospital longer.  Postpartum tubal ligation is considered permanent. You should not have this procedure if you want to get pregnant again or if you are unsure about having more children.  Talk to your health care provider to see if this procedure is  right for you. This information is not intended to replace advice given to you by your health care provider. Make sure you discuss any questions you have with your health care provider. Document Released: 01/15/2005 Document Revised: 12/05/2017 Document Reviewed: 12/05/2017 Elsevier Patient Education  2020 [employer]vier Inc[employer]  

## 2018-07-25 NOTE — Progress Notes (Signed)
Pt presents for ROB. Pt complains of having brown vaginal discharge. She denies having any odor or irritation. Sx started about 2 weeks ago.

## 2018-07-25 NOTE — Progress Notes (Signed)
   PRENATAL VISIT NOTE  Subjective:  Victoria Solis is a 28 y.o. [phone] at [redacted]w[redacted]d who presents today for routine prenatal care.  She is currently being monitored for supervision of a low-risk pregnancy with problems as listed below.  Patient has no pregnancy related concerns and endorses fetal movement.  She reports some vaginal discharge that is brown in color, but denies bleeding, leaking, itching, and burning. She states she has had the discharge for about 2 weeks without pain or cramping.  She denies recent sexual activity.  Patient expresses desire for BTL.  Patient Active Problem List   Diagnosis Date Noted  . Supervision of other normal pregnancy, antepartum 03/31/2018  . Previous cesarean delivery affecting pregnancy 03/31/2018  . Short interval between pregnancies affecting pregnancy, antepartum 03/31/2018    The following portions of the patient's history were reviewed and updated as appropriate: allergies, current medications, past family history, past medical history, past social history, past surgical history and problem list. Problem list updated.  Objective:   Vitals:   07/25/18 0907  BP: 102/64  Pulse: 69  Weight: 169 lb 3.2 oz (76.7 kg)    Fetal Status: Fetal Heart Rate (bpm): 125   Movement: Present     General:  Alert, oriented and cooperative. Patient is in no acute distress.  Skin: Skin is warm and dry.   Cardiovascular: Regular rate and rhythm.  Respiratory: Normal respiratory effort. CTA-Bilaterally  Abdomen: Soft, gravid, appropriate for gestational age.  Pelvic:  Cervical exam performed Dilation: Closed      Speculum Exam: Pink mucosa.  Moderate amt thin white curdy discharge. Cervix appears closed. No active bleeding or discharge.   Extremities: Normal range of motion.  Edema: None  Mental Status: Normal mood and affect. Normal behavior. Normal judgment and thought content.   Assessment and Plan:  Pregnancy: [id number] at [redacted]w[redacted]d  1. Supervision of  other normal pregnancy, antepartum -Anticipatory guidance for upcoming appointments. -Informed of need for MD visit to discuss and sign TOLAC consent. -Patient unsure of whether she wants BTL. Encouraged to sign papers today and can refuse in future if no longer desired.  Patient agreeable. *BTL papers signed -Informed that office would contact with any abnormal test results. -ROB in 2 weeks via webex.  - Glucose Tolerance, 2 Hours w/1 Hour - CBC - HIV Antibody (routine testing w rflx) - RPR - Tdap vaccine greater than or equal to 7yo IM  2. Vaginal discharge -Informed that exam findings c/w candidiasis of vagina. -Will send CV test to confirm and rule out other infections. -Rx for Terazol sent to pharmacy on file.  - Cervicovaginal ancillary only( [place])   Preterm labor symptoms and general obstetric precautions including but not limited to vaginal bleeding, contractions, leaking of fluid and fetal movement were reviewed with the patient.  Please refer to After Visit Summary for other counseling recommendations.  Return in about 2 weeks (around 08/08/2018) for LR-ROB.  Future Appointments  Date Time Provider [place]  08/08/2018  9:45 AM Lajean Manes, CNM [facility] None    Maryann Conners, CNM 07/25/2018, 9:47 AM

## 2018-07-26 LAB — CBC
Hematocrit: 29.3 % — ABNORMAL LOW (ref 34.0–46.6)
Hemoglobin: 10.6 g/dL — ABNORMAL LOW (ref 11.1–15.9)
MCH: 33.5 pg — ABNORMAL HIGH (ref 26.6–33.0)
MCHC: 36.2 g/dL — ABNORMAL HIGH (ref 31.5–35.7)
MCV: 93 fL (ref 79–97)
Platelets: 177 10*3/uL (ref 150–450)
RBC: 3.16 x10E6/uL — ABNORMAL LOW (ref 3.77–5.28)
RDW: 13 % (ref 11.7–15.4)
WBC: 6.6 10*3/uL (ref 3.4–10.8)

## 2018-07-26 LAB — GLUCOSE TOLERANCE, 2 HOURS W/ 1HR
Glucose, 1 hour: 194 mg/dL — ABNORMAL HIGH (ref 65–179)
Glucose, 2 hour: 179 mg/dL — ABNORMAL HIGH (ref 65–152)
Glucose, Fasting: 90 mg/dL (ref 65–91)

## 2018-07-26 LAB — RPR: RPR Ser Ql: NONREACTIVE

## 2018-07-26 LAB — HIV ANTIBODY (ROUTINE TESTING W REFLEX): HIV Screen 4th Generation wRfx: NONREACTIVE

## 2018-07-26 MED ORDER — TERCONAZOLE 0.4 % VA CREA: 1.0000 | TOPICAL_CREAM | Freq: Every day | VAGINAL | 0 refills | Status: DC

## 2018-07-29 LAB — CERVICOVAGINAL ANCILLARY ONLY
Bacterial vaginitis: NEGATIVE
Candida vaginitis: POSITIVE — AB
Chlamydia: POSITIVE — AB
Neisseria Gonorrhea: NEGATIVE
Trichomonas: NEGATIVE

## 2018-07-30 MED ORDER — AZITHROMYCIN 500 MG PO TABS: 1000.0000 mg | ORAL_TABLET | Freq: Once | ORAL | 0 refills | Status: AC

## 2018-07-30 NOTE — Addendum Note (Signed)
Addended by: Gavin Pound L on: 07/30/2018 09:51 AM   Modules accepted: Orders

## 2018-07-31 ENCOUNTER — Telehealth: Payer: Self-pay

## 2018-07-31 ENCOUNTER — Ambulatory Visit (HOSPITAL_COMMUNITY)
Admission: EM | Admit: 2018-07-31 | Discharge: 2018-07-31 | Disposition: A | Discharge status: Home / Self Care | Payer: BC Managed Care – PPO | Attending: Family Medicine | Admitting: Family Medicine

## 2018-07-31 ENCOUNTER — Other Ambulatory Visit: Payer: Self-pay

## 2018-07-31 ENCOUNTER — Encounter (HOSPITAL_COMMUNITY): Payer: Self-pay | Admitting: Emergency Medicine

## 2018-07-31 DIAGNOSIS — Z113 Encounter for screening for infections with a predominantly sexual mode of transmission: Secondary | ICD-10-CM | POA: Insufficient documentation

## 2018-07-31 DIAGNOSIS — O24419 Gestational diabetes mellitus in pregnancy, unspecified control: Secondary | ICD-10-CM

## 2018-07-31 MED ORDER — AZITHROMYCIN 500 MG PO TABS: 1000.0000 mg | ORAL_TABLET | Freq: Once | ORAL | 0 refills | Status: AC

## 2018-07-31 MED ORDER — ACCU-CHEK FASTCLIX LANCETS MISC: 1.0000 [IU] | Freq: Four times a day (QID) | 12 refills | Status: DC

## 2018-07-31 MED ORDER — ACCU-CHEK GUIDE W/DEVICE KIT: 1.0000 | PACK | Freq: Four times a day (QID) | 0 refills | Status: DC

## 2018-07-31 MED ORDER — ACCU-CHEK GUIDE VI STRP: ORAL_STRIP | 12 refills | Status: DC

## 2018-07-31 NOTE — Discharge Instructions (Signed)
We will send the swab off to check for gonorrhea, chlamydia and trichomonas. Based off previous swab and appearance of discharge today, I recommend you proceeding with taking azithromycin 1000 mg once For yeast it is recommended to use topical antifungals over the oral medicines due to the risk of using oral Diflucan in pregnancy  Please follow-up if having any persistent symptoms or developing any changes

## 2018-07-31 NOTE — Telephone Encounter (Signed)
I contacted patient regarding results Pt stated voiced understanding and I asked pt did she have any questions pt responded with "No" Pt then called back moments later regarding results. I was not able to take the call. Pt called again regarding results and medication. Pt asked what and why the pills were sent? Pt confirmed she received the Rx for yeast however, she is not using " no cream" and "ya"ll  need to send something else like a pill". Pt stated her partner has recently been tested for STD and was Negative she went to ask me did I want to talk to him.  She does not understand why her CT was positive. She questioned was I in the correct patient chart and began naming other female names.  Pt also declines GDM teaching and states "she is not claiming that" she states "I have never had diabetes". She mentioned she has a +CT in the past at a different office and was retested and it was negative. I told the pt she is free get a second opinion at a urgent care etc pt asked could her boyfriend be tested at a urgent care as well. I asked the patient was her boyfriends results shown to her or did he verbally tell her they were negative she confirmed he "told" her he tested negative.  Pt advised to take results with them to urgent care or where she chooses to have testing done since she is not agreeing with our results. Pt voiced understanding.

## 2018-07-31 NOTE — ED Triage Notes (Signed)
Pt here for STD screening.  She denies any symptoms or known exposure.

## 2018-08-01 NOTE — ED Provider Notes (Signed)
[place]    CSN: [id number] Arrival date & time: 07/31/18  [place]      History   Chief Complaint Chief Complaint  Patient presents with  . SEXUALLY TRANSMITTED DISEASE    HPI Victoria Solis is a 28 y.o. female currently [redacted] weeks pregnant presenting today for evaluation of STD screening.  Patient states that she was seen at women's clinic and had vaginal swab done last week.  She got the results today stating that she had yeast and chlamydia.  She is wary of these results as her last sexual encounter was approximately 3 to 4 months ago.  This is the only person that she has been sexually active with since being pregnant.  She denies any symptoms of discharge, pelvic pain.   Would like to have repeat testing today.  HPI  Past Medical History:  Diagnosis Date  . Migraine     Patient Active Problem List   Diagnosis Date Noted  . Supervision of other normal pregnancy, antepartum 03/31/2018  . Previous cesarean delivery affecting pregnancy 03/31/2018  . Short interval between pregnancies affecting pregnancy, antepartum 03/31/2018    Past Surgical History:  Procedure Laterality Date  . CESAREAN SECTION      OB History    Gravida  4   Para  3   Term  3   Preterm      AB      Living  3     SAB      TAB      Ectopic      Multiple  0   Live Births  3            Home Medications    Prior to Admission medications   Medication Sig Start Date End Date Taking? Authorizing Provider  Prenatal MV-Min-FA-Omega-3 (PRENATAL GUMMIES/DHA & FA) 0.4-32.5 MG CHEW Chew 1 tablet daily by mouth. 12/16/16  Yes Laury Deep, CNM  Accu-Chek FastClix Lancets MISC 1 Units by Percutaneous route 4 (four) times daily. 07/31/18   Gavin Pound, CNM  Blood Glucose Monitoring Suppl (ACCU-CHEK GUIDE) w/Device KIT 1 Device by Does not apply route 4 (four) times daily. 07/31/18   Gavin Pound, CNM  Blood Pressure Monitor MISC 1 each by Does not apply route once a  week. Check Blood Pressure weekly.  WU:J81.19       Z13.6 06/24/18   Shelly Bombard, MD  ferrous sulfate 325 (65 FE) MG tablet Take 325 mg by mouth daily with breakfast.    [provider]  glucose blood (ACCU-CHEK GUIDE) test strip Use to check blood sugars four times a day was instructed 07/31/18   Gavin Pound, CNM  Multiple Vitamins-Minerals (HAIR SKIN AND NAILS FORMULA PO) Take by mouth.    [provider]  ondansetron (ZOFRAN-ODT) 4 MG disintegrating tablet Take 1 tablet (4 mg total) by mouth every 8 (eight) hours as needed for nausea or vomiting. 03/11/18   Lajean Manes, CNM  terconazole (TERAZOL 7) 0.4 % vaginal cream Place 1 applicator vaginally at bedtime. 07/26/18   Gavin Pound, CNM    Family History Family History  Problem Relation Age of Onset  . Hypertension Mother   . Diabetes Mother   . Stroke Mother   . Kidney failure Mother   . Hypertension Father   . Diabetes Father     Social History Social History   Tobacco Use  . Smoking status: Never Smoker  . Smokeless tobacco: Never Used  Substance Use  Topics  . Alcohol use: No  . Drug use: No     Allergies   Amoxicillin, Latex, and Penicillins   Review of Systems Review of Systems  Constitutional: Negative for fever.  Respiratory: Negative for shortness of breath.   Cardiovascular: Negative for chest pain.  Gastrointestinal: Negative for abdominal pain, diarrhea, nausea and vomiting.  Genitourinary: Negative for dysuria, flank pain, genital sores, hematuria, menstrual problem, vaginal bleeding, vaginal discharge and vaginal pain.  Musculoskeletal: Negative for back pain.  Skin: Negative for rash.  Neurological: Negative for dizziness, light-headedness and headaches.     Physical Exam Triage Vital Signs ED Triage Vitals  Enc Vitals Group     BP 07/31/18 1837 96/70     Pulse Rate 07/31/18 1837 68     Resp 07/31/18 1837 12     Temp 07/31/18 1837 98.7 F (37.1 C)     Temp  Source 07/31/18 1837 Oral     SpO2 07/31/18 1837 99 %     Weight --      Height --      Head Circumference --      Peak Flow --      Pain Score 07/31/18 1834 0     Pain Loc --      Pain Edu? --      Excl. in [place]? --    No data found.  Updated Vital Signs BP 96/70 (BP Location: Left Arm)   Pulse 68   Temp 98.7 F (37.1 C) (Oral)   Resp 12   LMP  (LMP Unknown)   SpO2 99%   Visual Acuity Right Eye Distance:   Left Eye Distance:   Bilateral Distance:    Right Eye Near:   Left Eye Near:    Bilateral Near:     Physical Exam Vitals signs and nursing note reviewed.  Constitutional:      Appearance: She is well-developed.     Comments: No acute distress  HENT:     Head: Normocephalic and atraumatic.     Nose: Nose normal.  Eyes:     Conjunctiva/sclera: Conjunctivae normal.  Neck:     Musculoskeletal: Neck supple.  Cardiovascular:     Rate and Rhythm: Normal rate.  Pulmonary:     Effort: Pulmonary effort is normal. No respiratory distress.  Abdominal:     General: There is no distension.     Comments: Nontender to palpation, palpable firm uterus extending approximately 4 to 5 cm above umbilicus  Genitourinary:    Comments: Large amount of yellowish mucousy discharge present in vaginal vault, cervix appears erythematous Musculoskeletal: Normal range of motion.  Skin:    General: Skin is warm and dry.  Neurological:     Mental Status: She is alert and oriented to person, place, and time.      UC Treatments / Results  Labs (all labs ordered are listed, but only abnormal results are displayed) Labs Reviewed  CERVICOVAGINAL ANCILLARY ONLY    EKG   Radiology No results found.  Procedures Procedures (including critical care time)  Medications Ordered in UC Medications - No data to display  Initial Impression / Assessment and Plan / UC Course  I have reviewed the triage vital signs and the nursing notes.  Pertinent labs & imaging results that were  available during my care of the patient were reviewed by me and considered in my medical decision making (see chart for details).     Upon chart review does appear patient has had  swab that resulted positive for yeast and chlamydia.  Discussed importance of treating STDs and pregnancy.  Repeated vaginal swab today, but recommended to go ahead and initiate treatment of azithromycin 1 g which was previously prescribed from women's clinic.  Recent in this dose as well just to ensure patient taking.  Will call with results when returned.  Patient also expresses concern over using a cream for yeast.  Advised that this is the recommended method of treating a yeast infection in pregnancy.  Recommended against using oral pills.Discussed strict return precautions. Patient verbalized understanding and is agreeable with plan.  Final Clinical Impressions(s) / UC Diagnoses   Final diagnoses:  Screen for STD (sexually transmitted disease)     Discharge Instructions     We will send the swab off to check for gonorrhea, chlamydia and trichomonas. Based off previous swab and appearance of discharge today, I recommend you proceeding with taking azithromycin 1000 mg once For yeast it is recommended to use topical antifungals over the oral medicines due to the risk of using oral Diflucan in pregnancy  Please follow-up if having any persistent symptoms or developing any changes   ED Prescriptions    Medication Sig Dispense Auth. Provider   azithromycin (ZITHROMAX) 500 MG tablet Take 2 tablets (1,000 mg total) by mouth once for 1 dose. Take first 2 tablets together, then 1 every day until finished. 2 tablet Cleota Pellerito C, PA-C     Controlled Substance Prescriptions [place] Controlled Substance Registry consulted? Not Applicable   Janith Lima, [place] 08/01/18 0725

## 2018-08-04 LAB — CERVICOVAGINAL ANCILLARY ONLY
Bacterial vaginitis: NEGATIVE
Candida vaginitis: POSITIVE — AB
Chlamydia: POSITIVE — AB
Neisseria Gonorrhea: NEGATIVE
Trichomonas: NEGATIVE

## 2018-08-05 ENCOUNTER — Encounter: Payer: Self-pay | Admitting: Obstetrics

## 2018-08-06 ENCOUNTER — Telehealth: Payer: Self-pay

## 2018-08-06 NOTE — Telephone Encounter (Signed)
Late Entry   Return call to pt on 08/05/18 regarding Note for work.  Pt mentioned she cannot stand for long periods of time etc. Pt did not answer phone went to direct vm  I left detailed message on pt vm that we could provide her with a work restriction note. Note was printed and placed up front with another note pt requested from front desk

## 2018-08-07 ENCOUNTER — Telehealth (HOSPITAL_COMMUNITY): Payer: Self-pay | Admitting: Emergency Medicine

## 2018-08-07 NOTE — Telephone Encounter (Signed)
Chlamydia is positive.  This was treated at the urgent care visit with po zithromax 1g.  Pt needs education to please refrain from sexual intercourse for 7 days to give the medicine time to work.  Sexual partners need to be notified and tested/treated.  Condoms may reduce risk of reinfection.  Recheck or followup with PCP for further evaluation if symptoms are not improving.  [facility] notified.  Candida (yeast) is positive.  Prescription for fluconazole was given at the urgent care visit.    Pt states she has been using the gel for the yeast infection.  Patient contacted and made aware of all results, all questions answered.

## 2018-08-08 ENCOUNTER — Ambulatory Visit (INDEPENDENT_AMBULATORY_CARE_PROVIDER_SITE_OTHER): Payer: Medicaid Other | Admitting: Certified Nurse Midwife

## 2018-08-08 VITALS — BP 112/73 | HR 52

## 2018-08-08 DIAGNOSIS — A749 Chlamydial infection, unspecified: Secondary | ICD-10-CM

## 2018-08-08 DIAGNOSIS — O34219 Maternal care for unspecified type scar from previous cesarean delivery: Secondary | ICD-10-CM

## 2018-08-08 DIAGNOSIS — Z3A29 29 weeks gestation of pregnancy: Secondary | ICD-10-CM

## 2018-08-08 DIAGNOSIS — O24410 Gestational diabetes mellitus in pregnancy, diet controlled: Secondary | ICD-10-CM

## 2018-08-08 DIAGNOSIS — O24419 Gestational diabetes mellitus in pregnancy, unspecified control: Secondary | ICD-10-CM | POA: Insufficient documentation

## 2018-08-08 DIAGNOSIS — O98819 Other maternal infectious and parasitic diseases complicating pregnancy, unspecified trimester: Secondary | ICD-10-CM

## 2018-08-08 DIAGNOSIS — Z3480 Encounter for supervision of other normal pregnancy, unspecified trimester: Secondary | ICD-10-CM

## 2018-08-08 DIAGNOSIS — O98813 Other maternal infectious and parasitic diseases complicating pregnancy, third trimester: Secondary | ICD-10-CM

## 2018-08-08 NOTE — Progress Notes (Signed)
S/w pt to prep for webex visit. Pt reports fetal movement, denies pain. Pt states that she has not picked up diabetic supplies states that she will check BG but will not go to the class.

## 2018-08-08 NOTE — Progress Notes (Signed)
[facility] VIRTUAL VIDEO VISIT ENCOUNTER NOTE  Provider location: [facility] for [facility] at [place]   I connected with [name] on 08/08/18 at  9:28 AM EDT by [place] Encounter at home and verified that I am speaking with the correct person using two identifiers.   I discussed the limitations, risks, security and privacy concerns of performing an evaluation and management service virtually and the availability of in person appointments. I also discussed with the patient that there may be a patient responsible charge related to this service. The patient expressed understanding and agreed to proceed. Subjective:  Victoria Solis is a 28 y.o. [phone] at [redacted]w[redacted]d being seen today for ongoing prenatal care.  She is currently monitored for the following issues for this low-risk pregnancy and has Supervision of other normal pregnancy, antepartum; Previous cesarean delivery affecting pregnancy; and Short interval between pregnancies affecting pregnancy, antepartum on their problem list.  Patient reports no complaints.  Contractions: Not present. Vag. Bleeding: None.  Movement: Present. Denies any leaking of fluid.   The following portions of the patient's history were reviewed and updated as appropriate: allergies, current medications, past family history, past medical history, past social history, past surgical history and problem list.   Objective:   Vitals:   08/08/18 0919  BP: 112/73  Pulse: (!) 52    Fetal Status:     Movement: Present     General:  Alert, oriented and cooperative. Patient is in no acute distress.  Respiratory: Normal respiratory effort, no problems with respiration noted  Mental Status: Normal mood and affect. Normal behavior. Normal judgment and thought content.  Rest of physical exam deferred due to type of encounter  Imaging: No results found.  Assessment and Plan:  Pregnancy: G4P3003 at [redacted]w[redacted]d 1. Supervision of other  normal pregnancy, antepartum - Patient doing well, no complaints - Anticipatory guidance on upcoming appointments - Routine prenatal care - Encouraged to continue taking BP weekly and enter into babyscripts app   2. Previous cesarean delivery affecting pregnancy - Hx of C/S x1, patient reports "emergency C/S because she got stuck" - Successful VBAC, plans TOLAC this pregnancy  - Needs consent signed with MD  3. Chlamydia infection during pregnancy - Patient reports taking medication  - Diagnosed on 6/26 - TOC needed at next appointment   4. Diet controlled gestational diabetes mellitus (GDM) in third trimester - Patient reports not taking CBGs, has not picked up - Encouraged patient to pick up supplies and start taking 4x day, patient verbalizes understanding     Preterm labor symptoms and general obstetric precautions including but not limited to vaginal bleeding, contractions, leaking of fluid and fetal movement were reviewed in detail with the patient. I discussed the assessment and treatment plan with the patient. The patient was provided an opportunity to ask questions and all were answered. The patient agreed with the plan and demonstrated an understanding of the instructions. The patient was advised to call back or seek an in-person office evaluation/go to [facility] at [facility] for any urgent or concerning symptoms. Please refer to After Visit Summary for other counseling recommendations.   I provided 10 minutes of face-to-face time during this encounter.  Return in about 17 days (around 08/25/2018) for ROB/TOC and TOLAC consent.  Future Appointments  Date Time Provider [place]  08/22/2018 11:15 AM Chancy Milroy, MD [place] None    Lajean Manes, [place] for [facility], [place]

## 2018-08-20 ENCOUNTER — Other Ambulatory Visit: Payer: Self-pay

## 2018-08-20 ENCOUNTER — Encounter: Payer: Medicaid Other | Attending: Obstetrics and Gynecology | Admitting: Registered"

## 2018-08-20 DIAGNOSIS — O99810 Abnormal glucose complicating pregnancy: Secondary | ICD-10-CM | POA: Diagnosis present

## 2018-08-22 ENCOUNTER — Other Ambulatory Visit: Payer: Self-pay

## 2018-08-22 ENCOUNTER — Ambulatory Visit (INDEPENDENT_AMBULATORY_CARE_PROVIDER_SITE_OTHER): Payer: Medicaid Other | Admitting: Obstetrics and Gynecology

## 2018-08-22 ENCOUNTER — Other Ambulatory Visit (HOSPITAL_COMMUNITY)
Admission: RE | Admit: 2018-08-22 | Discharge: 2018-08-22 | Disposition: A | Discharge status: Home / Self Care | Payer: Medicaid Other | Source: Ambulatory Visit | Attending: Obstetrics and Gynecology | Admitting: Obstetrics and Gynecology

## 2018-08-22 VITALS — BP 106/70 | HR 78 | Wt 176.2 lb

## 2018-08-22 DIAGNOSIS — Z3A31 31 weeks gestation of pregnancy: Secondary | ICD-10-CM

## 2018-08-22 DIAGNOSIS — Z3009 Encounter for other general counseling and advice on contraception: Secondary | ICD-10-CM

## 2018-08-22 DIAGNOSIS — O34219 Maternal care for unspecified type scar from previous cesarean delivery: Secondary | ICD-10-CM

## 2018-08-22 DIAGNOSIS — O98813 Other maternal infectious and parasitic diseases complicating pregnancy, third trimester: Secondary | ICD-10-CM

## 2018-08-22 DIAGNOSIS — A749 Chlamydial infection, unspecified: Secondary | ICD-10-CM | POA: Insufficient documentation

## 2018-08-22 DIAGNOSIS — Z3480 Encounter for supervision of other normal pregnancy, unspecified trimester: Secondary | ICD-10-CM

## 2018-08-22 DIAGNOSIS — O24410 Gestational diabetes mellitus in pregnancy, diet controlled: Secondary | ICD-10-CM

## 2018-08-22 NOTE — Progress Notes (Signed)
Subjective:  Victoria Solis is a 28 y.o. [phone] at [redacted]w[redacted]d being seen today for ongoing prenatal care.  She is currently monitored for the following issues for this high-risk pregnancy and has Supervision of other normal pregnancy, antepartum; Previous cesarean delivery affecting pregnancy; Short interval between pregnancies affecting pregnancy, antepartum; Chlamydia infection during pregnancy; Gestational diabetes mellitus; and Unwanted fertility on their problem list.  Patient reports no complaints.  Contractions: Not present. Vag. Bleeding: None.  Movement: Present. Denies leaking of fluid.   The following portions of the patient's history were reviewed and updated as appropriate: allergies, current medications, past family history, past medical history, past social history, past surgical history and problem list. Problem list updated.  Objective:   Vitals:   08/22/18 1125  BP: 106/70  Pulse: 78  Weight: 176 lb 3.2 oz (79.9 kg)    Fetal Status: Fetal Heart Rate (bpm): 138   Movement: Present     General:  Alert, oriented and cooperative. Patient is in no acute distress.  Skin: Skin is warm and dry. No rash noted.   Cardiovascular: Normal heart rate noted  Respiratory: Normal respiratory effort, no problems with respiration noted  Abdomen: Soft, gravid, appropriate for gestational age. Pain/Pressure: Absent     Pelvic:  Cervical exam deferred        Extremities: Normal range of motion.  Edema: None  Mental Status: Normal mood and affect. Normal behavior. Normal judgment and thought content.   Urinalysis:      Assessment and Plan:  Pregnancy: G4P3003 at [redacted]w[redacted]d  1. Chlamydia TOC today - Cervicovaginal ancillary only( [place])  2. Supervision of other normal pregnancy, antepartum Stable  3. Diet controlled gestational diabetes mellitus (GDM) in third trimester GDM reviewed with pt CBG control and relationship to pregnancy reviewed with pt Importance of checking  CBG's, recording, bring recordings to OB visits and follow ing diet discussed  4. Chlamydia infection during pregnancy TOC today  5. Previous cesarean delivery affecting pregnancy TOLAC consent signed today  6. Unwanted fertility BTL papers signed 07/25/18  Preterm labor symptoms and general obstetric precautions including but not limited to vaginal bleeding, contractions, leaking of fluid and fetal movement were reviewed in detail with the patient. Please refer to After Visit Summary for other counseling recommendations.  Return in about 1 week (around 08/29/2018) for OB visit, face to face, GDM monitoring.   Chancy Milroy, MD

## 2018-08-22 NOTE — Progress Notes (Signed)
Pt is here for ROB, [redacted]w[redacted]d. TOC today for Chlamydia, treated at UC with zithromax.

## 2018-08-22 NOTE — Patient Instructions (Signed)
Third Trimester of Pregnancy The third trimester is from week 28 through week 40 (months 7 through 9). The third trimester is a time when the unborn baby (fetus) is growing rapidly. At the end of the ninth month, the fetus is about 20 inches in length and weighs 6-10 pounds. Body changes during your third trimester Your body will continue to go through many changes during pregnancy. The changes vary from woman to woman. During the third trimester: Your weight will continue to increase. You can expect to gain 25-35 pounds (11-16 kg) by the end of the pregnancy. You may begin to get stretch marks on your hips, abdomen, and breasts. You may urinate more often because the fetus is moving lower into your pelvis and pressing on your bladder. You may develop or continue to have heartburn. This is caused by increased hormones that slow down muscles in the digestive tract. You may develop or continue to have constipation because increased hormones slow digestion and cause the muscles that push waste through your intestines to relax. You may develop hemorrhoids. These are swollen veins (varicose veins) in the rectum that can itch or be painful. You may develop swollen, bulging veins (varicose veins) in your legs. You may have increased body aches in the pelvis, back, or thighs. This is due to weight gain and increased hormones that are relaxing your joints. You may have changes in your hair. These can include thickening of your hair, rapid growth, and changes in texture. Some women also have hair loss during or after pregnancy, or hair that feels dry or thin. Your hair will most likely return to normal after your baby is born. Your breasts will continue to grow and they will continue to become tender. A yellow fluid (colostrum) may leak from your breasts. This is the first milk you are producing for your baby. Your belly button may stick out. You may notice more swelling in your hands, face, or ankles. You may  have increased tingling or numbness in your hands, arms, and legs. The skin on your belly may also feel numb. You may feel short of breath because of your expanding uterus. You may have more problems sleeping. This can be caused by the size of your belly, increased need to urinate, and an increase in your body's metabolism. You may notice the fetus "dropping," or moving lower in your abdomen (lightening). You may have increased vaginal discharge. You may notice your joints feel loose and you may have pain around your pelvic bone. What to expect at prenatal visits You will have prenatal exams every 2 weeks until week 36. Then you will have weekly prenatal exams. During a routine prenatal visit: You will be weighed to make sure you and the baby are growing normally. Your blood pressure will be taken. Your abdomen will be measured to track your baby's growth. The fetal heartbeat will be listened to. Any test results from the previous visit will be discussed. You may have a cervical check near your due date to see if your cervix has softened or thinned (effaced). You will be tested for Group B streptococcus. This happens between 35 and 37 weeks. Your health care provider may ask you: What your birth plan is. How you are feeling. If you are feeling the baby move. If you have had any abnormal symptoms, such as leaking fluid, bleeding, severe headaches, or abdominal cramping. If you are using any tobacco products, including cigarettes, chewing tobacco, and electronic cigarettes. If you have any  questions. Other tests or screenings that may be performed during your third trimester include: Blood tests that check for low iron levels (anemia). Fetal testing to check the health, activity level, and growth of the fetus. Testing is done if you have certain medical conditions or if there are problems during the pregnancy. Nonstress test (NST). This test checks the health of your baby to make sure there  are no signs of problems, such as the baby not getting enough oxygen. During this test, a belt is placed around your belly. The baby is made to move, and its heart rate is monitored during movement. What is false labor? False labor is a condition in which you feel small, irregular tightenings of the muscles in the womb (contractions) that usually go away with rest, changing position, or drinking water. These are called Braxton Hicks contractions. Contractions may last for hours, days, or even weeks before true labor sets in. If contractions come at regular intervals, become more frequent, increase in intensity, or become painful, you should see your health care provider. What are the signs of labor? Abdominal cramps. Regular contractions that start at 10 minutes apart and become stronger and more frequent with time. Contractions that start on the top of the uterus and spread down to the lower abdomen and back. Increased pelvic pressure and dull back pain. A watery or bloody mucus discharge that comes from the vagina. Leaking of amniotic fluid. This is also known as your "water breaking." It could be a slow trickle or a gush. Let your health care provider know if it has a color or strange odor. If you have any of these signs, call your health care provider right away, even if it is before your due date. Follow these instructions at home: Medicines Follow your health care provider's instructions regarding medicine use. Specific medicines may be either safe or unsafe to take during pregnancy. Take a prenatal vitamin that contains at least 600 micrograms (mcg) of folic acid. If you develop constipation, try taking a stool softener if your health care provider approves. Eating and drinking  Eat a balanced diet that includes fresh fruits and vegetables, whole grains, good sources of protein such as meat, eggs, or tofu, and low-fat dairy. Your health care provider will help you determine the amount of  weight gain that is right for you. Avoid raw meat and uncooked cheese. These carry germs that can cause birth defects in the baby. If you have low calcium intake from food, talk to your health care provider about whether you should take a daily calcium supplement. Eat four or five small meals rather than three large meals a day. Limit foods that are high in fat and processed sugars, such as fried and sweet foods. To prevent constipation: Drink enough fluid to keep your urine clear or pale yellow. Eat foods that are high in fiber, such as fresh fruits and vegetables, whole grains, and beans. Activity Exercise only as directed by your health care provider. Most women can continue their usual exercise routine during pregnancy. Try to exercise for 30 minutes at least 5 days a week. Stop exercising if you experience uterine contractions. Avoid heavy lifting. Do not exercise in extreme heat or humidity, or at high altitudes. Wear low-heel, comfortable shoes. Practice good posture. You may continue to have sex unless your health care provider tells you otherwise. Relieving pain and discomfort Take frequent breaks and rest with your legs elevated if you have leg cramps or low back pain.  Take warm sitz baths to soothe any pain or discomfort caused by hemorrhoids. Use hemorrhoid cream if your health care provider approves. Wear a good support bra to prevent discomfort from breast tenderness. If you develop varicose veins: Wear support pantyhose or compression stockings as told by your healthcare provider. Elevate your feet for 15 minutes, 3-4 times a day. Prenatal care Write down your questions. Take them to your prenatal visits. Keep all your prenatal visits as told by your health care provider. This is important. Safety Wear your seat belt at all times when driving. Make a list of emergency phone numbers, including numbers for family, friends, the hospital, and police and fire  departments. General instructions Avoid cat litter boxes and soil used by cats. These carry germs that can cause birth defects in the baby. If you have a cat, ask someone to clean the litter box for you. Do not travel far distances unless it is absolutely necessary and only with the approval of your health care provider. Do not use hot tubs, steam rooms, or saunas. Do not drink alcohol. Do not use any products that contain nicotine or tobacco, such as cigarettes and e-cigarettes. If you need help quitting, ask your health care provider. Do not use any medicinal herbs or unprescribed drugs. These chemicals affect the formation and growth of the baby. Do not douche or use tampons or scented sanitary pads. Do not cross your legs for long periods of time. To prepare for the arrival of your baby: Take prenatal classes to understand, practice, and ask questions about labor and delivery. Make a trial run to the hospital. Visit the hospital and tour the maternity area. Arrange for maternity or paternity leave through employers. Arrange for family and friends to take care of pets while you are in the hospital. Purchase a rear-facing car seat and make sure you know how to install it in your car. Pack your hospital bag. Prepare the babys nursery. Make sure to remove all pillows and stuffed animals from the baby's crib to prevent suffocation. Visit your dentist if you have not gone during your pregnancy. Use a soft toothbrush to brush your teeth and be gentle when you floss. Contact a health care provider if: You are unsure if you are in labor or if your water has broken. You become dizzy. You have mild pelvic cramps, pelvic pressure, or nagging pain in your abdominal area. You have lower back pain. You have persistent nausea, vomiting, or diarrhea. You have an unusual or bad smelling vaginal discharge. You have pain when you urinate. Get help right away if: Your water breaks before 37 weeks. You  have regular contractions less than 5 minutes apart before 37 weeks. You have a fever. You are leaking fluid from your vagina. You have spotting or bleeding from your vagina. You have severe abdominal pain or cramping. You have rapid weight loss or weight gain. You have shortness of breath with chest pain. You notice sudden or extreme swelling of your face, hands, ankles, feet, or legs. Your baby makes fewer than 10 movements in 2 hours. You have severe headaches that do not go away when you take medicine. You have vision changes. Summary The third trimester is from week 28 through week 40, months 7 through 9. The third trimester is a time when the unborn baby (fetus) is growing rapidly. During the third trimester, your discomfort may increase as you and your baby continue to gain weight. You may have abdominal, leg, and back  pain, sleeping problems, and an increased need to urinate. During the third trimester your breasts will keep growing and they will continue to become tender. A yellow fluid (colostrum) may leak from your breasts. This is the first milk you are producing for your baby. False labor is a condition in which you feel small, irregular tightenings of the muscles in the womb (contractions) that eventually go away. These are called Braxton Hicks contractions. Contractions may last for hours, days, or even weeks before true labor sets in. Signs of labor can include: abdominal cramps; regular contractions that start at 10 minutes apart and become stronger and more frequent with time; watery or bloody mucus discharge that comes from the vagina; increased pelvic pressure and dull back pain; and leaking of amniotic fluid. This information is not intended to replace advice given to you by your health care provider. Make sure you discuss any questions you have with your health care provider. Document Released: 01/09/2001 Document Revised: 05/08/2018 Document Reviewed: 02/21/2016 Elsevier  Patient Education  2020 [employer] Gestational Diabetes Mellitus, Diagnosis Gestational diabetes (gestational diabetes mellitus) is a short-term (temporary) form of diabetes that can happen during pregnancy. It goes away after you give birth. It may be caused by one or both of these problems:  Your pancreas does not make enough of a hormone called insulin.  Your body does not respond in a normal way to insulin that it makes. Insulin lets sugars (glucose) go into cells in the body. This gives you energy. If you have diabetes, sugars cannot get into cells. This causes high blood sugar (hyperglycemia). If you get gestational diabetes, you are:  More likely to get it if you get pregnant again.  More likely to develop type 2 diabetes in the future. If gestational diabetes is treated, it may not hurt you or your baby. Your doctor will set treatment goals for you. In general, you should have these blood sugar levels:  After not eating for a long time (fasting): 95 mg/dL (5.3 mmol/L).  After meals (postprandial): ? One hour after a meal: at or below 140 mg/dL (7.8 mmol/L). ? Two hours after a meal: at or below 120 mg/dL (6.7 mmol/L).  A1c (hemoglobin A1c) level: 6-6.5%. Follow these instructions at home: Questions to ask your doctor   You may want to ask these questions: ? Do I need to meet with a diabetes educator? ? What equipment will I need to care for myself at home? ? What medicines do I need? When should I take them? ? How often do I need to check my blood sugar? ? What number can I call if I have questions? ? When is my next doctor's visit? General instructions  Take over-the-counter and prescription medicines only as told by your doctor.  Stay at a healthy weight during pregnancy.  Keep all follow-up visits as told by your doctor. This is important. Contact a doctor if:  Your blood sugar is at or above 240 mg/dL ([phone] mmol/L).  Your blood sugar is at or above 200  mg/dL (25.311.1 mmol/L) and you have ketones in your pee (urine).  You have been sick or have had a fever for 2 days or more and you are not getting better.  You have any of these problems for more than 6 hours: ? You cannot eat or drink. ? You feel sick to your stomach (nauseous). ? You throw up (vomit). ? You have watery poop (diarrhea). Get help right away if:  Your blood  sugar is lower than 54 mg/dL (3 mmol/L).  You get confused.  You have trouble: ? Thinking clearly. ? Breathing.  Your baby moves less than normal.  You have any of these: ? Moderate or large ketone levels in your pee. ? Blood coming from your vagina. ? Unusual fluid coming from your vagina. ? Early contractions. These may feel like tightness in your belly. Summary  Gestational diabetes is a short-term form of diabetes. It can happen while you are pregnant. It goes away after you give birth.  If gestational diabetes is treated, it may not hurt you or your baby. Your doctor will set treatment goals for you.  Keep all follow-up visits as told by your doctor. This is important. This information is not intended to replace advice given to you by your health care provider. Make sure you discuss any questions you have with your health care provider. Document Released: 05/09/2015 Document Revised: 02/21/2017 Document Reviewed: 02/18/2015 Elsevier Patient Education  2020 [employer]

## 2018-08-23 ENCOUNTER — Encounter: Payer: Self-pay | Admitting: Registered"

## 2018-08-23 LAB — CERVICOVAGINAL ANCILLARY ONLY
Chlamydia: NEGATIVE
Neisseria Gonorrhea: NEGATIVE

## 2018-08-23 NOTE — Progress Notes (Signed)
Patient was seen on 08/20/2018 for Gestational Diabetes self-management class at the [facility]. The following learning objectives were met by the patient during this course:   States the definition of Gestational Diabetes  States why dietary management is important in controlling blood glucose  Describes the effects each nutrient has on blood glucose levels  Demonstrates ability to create a balanced meal plan  Demonstrates carbohydrate counting   States when to check blood glucose levels  Demonstrates proper blood glucose monitoring techniques  States the effect of stress and exercise on blood glucose levels  States the importance of limiting caffeine and abstaining from alcohol and smoking  Blood glucose monitor given: Accu-chek Guide Me Lot # [id number] Exp: 10/23/19 Blood glucose reading: 108 mg/dL  Patient instructed to monitor glucose levels: FBS: 60 - <95; 1 hour: <140; 2 hour: <120  Patient received handouts:  Nutrition Diabetes and Pregnancy, including carb counting list  Patient will be seen for follow-up as needed.

## 2018-08-25 ENCOUNTER — Encounter: Payer: Self-pay | Admitting: Registered"

## 2018-08-28 ENCOUNTER — Encounter: Payer: Self-pay | Admitting: Family Medicine

## 2018-08-28 ENCOUNTER — Other Ambulatory Visit: Payer: Self-pay

## 2018-08-28 ENCOUNTER — Ambulatory Visit (INDEPENDENT_AMBULATORY_CARE_PROVIDER_SITE_OTHER): Payer: Medicaid Other | Admitting: Family Medicine

## 2018-08-28 VITALS — BP 104/64 | HR 75 | Wt 177.4 lb

## 2018-08-28 DIAGNOSIS — Z3A32 32 weeks gestation of pregnancy: Secondary | ICD-10-CM

## 2018-08-28 DIAGNOSIS — O0990 Supervision of high risk pregnancy, unspecified, unspecified trimester: Secondary | ICD-10-CM

## 2018-08-28 DIAGNOSIS — O24410 Gestational diabetes mellitus in pregnancy, diet controlled: Secondary | ICD-10-CM

## 2018-08-28 DIAGNOSIS — O0993 Supervision of high risk pregnancy, unspecified, third trimester: Secondary | ICD-10-CM

## 2018-08-28 NOTE — Progress Notes (Signed)
   PRENATAL VISIT NOTE  Subjective:  Victoria Solis is a 28 y.o. [phone] at [redacted]w[redacted]d being seen today for ongoing prenatal care.  She is currently monitored for the following issues for this high-risk pregnancy and has Supervision of high risk pregnancy, antepartum; Previous cesarean delivery affecting pregnancy; Short interval between pregnancies affecting pregnancy, antepartum; Chlamydia infection during pregnancy; Gestational diabetes mellitus; and Unwanted fertility on their problem list.  Patient reports no complaints.  Contractions: Not present. Vag. Bleeding: None.  Movement: Present. Denies leaking of fluid.   The following portions of the patient's history were reviewed and updated as appropriate: allergies, current medications, past family history, past medical history, past social history, past surgical history and problem list.   Objective:   Vitals:   08/28/18 0851  BP: 104/64  Pulse: 75  Weight: 177 lb 6.4 oz (80.5 kg)    Fetal Status: Fetal Heart Rate (bpm): 140 Fundal Height: 29 cm Movement: Present     General:  Alert, oriented and cooperative. Patient is in no acute distress.  Skin: Skin is warm and dry. No rash noted.   Cardiovascular: Normal heart rate noted  Respiratory: Normal respiratory effort, no problems with respiration noted  Abdomen: Soft, gravid, appropriate for gestational age.  Pain/Pressure: Absent     Pelvic: Cervical exam deferred        Extremities: Normal range of motion.  Edema: None  Mental Status: Normal mood and affect. Normal behavior. Normal judgment and thought content.   Assessment and Plan:  Pregnancy: G4P3003 at [redacted]w[redacted]d 1. Supervision of high risk pregnancy, antepartum   2. Diet controlled gestational diabetes mellitus (GDM) in third trimester No book, no meter, not testing, due to out of strips. Consequences of poorly controlled GDM discussed at length.   Preterm labor symptoms and general obstetric precautions including but not  limited to vaginal bleeding, contractions, leaking of fluid and fetal movement were reviewed in detail with the patient. Please refer to After Visit Summary for other counseling recommendations.   Return in 1 week (on 09/04/2018) for [facility], virtual. Blood sugar review.  Future Appointments  Date Time Provider [place]  09/03/2018 10:30 AM Woodroe Mode, MD [facility] None    Donnamae Jude, MD

## 2018-08-28 NOTE — Patient Instructions (Signed)

## 2018-08-28 NOTE — Progress Notes (Signed)
Pt did not check CBG's this week. Pt will pick up strip rf's today.

## 2018-09-03 ENCOUNTER — Encounter: Payer: Medicaid Other | Admitting: Obstetrics & Gynecology

## 2018-09-03 MED ORDER — ACCU-CHEK GUIDE VI STRP: ORAL_STRIP | 12 refills | Status: DC

## 2018-09-03 MED ORDER — ACCU-CHEK FASTCLIX LANCETS MISC: 1.0000 | Freq: Four times a day (QID) | 12 refills | Status: DC

## 2018-09-03 NOTE — Progress Notes (Signed)
S/w pt for virtual appt, pt reports fetal movement, denies pain. Pt states that she is having trouble getting glucose strips from the pharmacy,advised will resend. Pt is at work and does not have BP cuff with her today.

## 2018-09-05 ENCOUNTER — Telehealth: Payer: Self-pay | Admitting: Obstetrics & Gynecology

## 2018-09-06 ENCOUNTER — Encounter: Payer: Self-pay | Admitting: Family Medicine

## 2018-09-15 ENCOUNTER — Other Ambulatory Visit: Payer: Self-pay

## 2018-09-15 ENCOUNTER — Telehealth: Payer: Medicaid Other | Admitting: Obstetrics

## 2018-09-15 ENCOUNTER — Encounter: Payer: Self-pay | Admitting: Obstetrics

## 2018-09-15 DIAGNOSIS — O34219 Maternal care for unspecified type scar from previous cesarean delivery: Secondary | ICD-10-CM

## 2018-09-15 DIAGNOSIS — O0990 Supervision of high risk pregnancy, unspecified, unspecified trimester: Secondary | ICD-10-CM

## 2018-09-15 DIAGNOSIS — O24410 Gestational diabetes mellitus in pregnancy, diet controlled: Secondary | ICD-10-CM

## 2018-09-15 NOTE — Progress Notes (Signed)
Patient did not answer nurse's call. 

## 2018-09-16 ENCOUNTER — Encounter: Payer: Self-pay | Admitting: Obstetrics

## 2018-09-16 ENCOUNTER — Telehealth (INDEPENDENT_AMBULATORY_CARE_PROVIDER_SITE_OTHER): Payer: Medicaid Other | Admitting: Obstetrics

## 2018-09-16 VITALS — BP 116/68 | HR 72

## 2018-09-16 DIAGNOSIS — Z3A35 35 weeks gestation of pregnancy: Secondary | ICD-10-CM

## 2018-09-16 DIAGNOSIS — O98819 Other maternal infectious and parasitic diseases complicating pregnancy, unspecified trimester: Secondary | ICD-10-CM

## 2018-09-16 DIAGNOSIS — A749 Chlamydial infection, unspecified: Secondary | ICD-10-CM

## 2018-09-16 DIAGNOSIS — O98813 Other maternal infectious and parasitic diseases complicating pregnancy, third trimester: Secondary | ICD-10-CM

## 2018-09-16 DIAGNOSIS — O0990 Supervision of high risk pregnancy, unspecified, unspecified trimester: Secondary | ICD-10-CM

## 2018-09-16 DIAGNOSIS — O0993 Supervision of high risk pregnancy, unspecified, third trimester: Secondary | ICD-10-CM

## 2018-09-16 DIAGNOSIS — O24410 Gestational diabetes mellitus in pregnancy, diet controlled: Secondary | ICD-10-CM

## 2018-09-16 DIAGNOSIS — O09893 Supervision of other high risk pregnancies, third trimester: Secondary | ICD-10-CM

## 2018-09-16 DIAGNOSIS — Z91199 Patient's noncompliance with other medical treatment and regimen due to unspecified reason: Secondary | ICD-10-CM

## 2018-09-16 DIAGNOSIS — Z9119 Patient's noncompliance with other medical treatment and regimen: Secondary | ICD-10-CM

## 2018-09-16 DIAGNOSIS — O34219 Maternal care for unspecified type scar from previous cesarean delivery: Secondary | ICD-10-CM

## 2018-09-16 DIAGNOSIS — O09899 Supervision of other high risk pregnancies, unspecified trimester: Secondary | ICD-10-CM

## 2018-09-16 NOTE — Progress Notes (Signed)
Pt has not been checking blood sugars.  Pt states she has been having insurance issues and cannot get supplies. Pt has been advised to follow up with previous employer about insurance and to contact Medicaid services to apply for coverage.

## 2018-09-16 NOTE — Progress Notes (Signed)
   TELEHEALTH OBSTETRICS PRENATAL VIRTUAL VIDEO VISIT ENCOUNTER NOTE  Provider location: [facility] for [facility] at [place]   I connected with [name] on 09/16/18 at  4:00 PM EDT by WebEx OB MyChart Video Encounter at home and verified that I am speaking with the correct person using two identifiers.   I discussed the limitations, risks, security and privacy concerns of performing an evaluation and management service virtually and the availability of in person appointments. I also discussed with the patient that there may be a patient responsible charge related to this service. The patient expressed understanding and agreed to proceed. Subjective:  Victoria Solis is a 28 y.o. [phone] at [redacted]w[redacted]d being seen today for ongoing prenatal care.  She is currently monitored for the following issues for this high-risk pregnancy and has Supervision of high risk pregnancy, antepartum; Previous cesarean delivery affecting pregnancy; Short interval between pregnancies affecting pregnancy, antepartum; Chlamydia infection during pregnancy; Gestational diabetes mellitus; and Unwanted fertility on their problem list.  Patient reports no complaints.  Contractions: Not present.  .  Movement: Present. Denies any leaking of fluid.   The following portions of the patient's history were reviewed and updated as appropriate: allergies, current medications, past family history, past medical history, past social history, past surgical history and problem list.   Objective:   Vitals:   09/16/18 1551  BP: 116/68  Pulse: 72    Fetal Status:     Movement: Present     General:  Alert, oriented and cooperative. Patient is in no acute distress.  Respiratory: Normal respiratory effort, no problems with respiration noted  Mental Status: Normal mood and affect. Normal behavior. Normal judgment and thought content.  Rest of physical exam deferred due to type of encounter  Imaging: No results found.  Assessment and Plan:  Pregnancy: G4P3003 at [redacted]w[redacted]d 1. Supervision of high risk pregnancy, antepartum  2. Diet controlled gestational diabetes mellitus (GDM) in third trimester Rx: - [place] MFM OB FOLLOW UP; Future  3. Noncompliance with self-monitoring regimen  4. Previous cesarean delivery affecting pregnancy  5. Short interval between pregnancies affecting pregnancy, antepartum  6. Chlamydia infection during pregnancy   Preterm labor symptoms and general obstetric precautions including but not limited to vaginal bleeding, contractions, leaking of fluid and fetal movement were reviewed in detail with the patient. I discussed the assessment and treatment plan with the patient. The patient was provided an opportunity to ask questions and all were answered. The patient agreed with the plan and demonstrated an understanding of the instructions. The patient was advised to call back or seek an in-person office evaluation/go to [facility] at [facility] for any urgent or concerning symptoms. Please refer to After Visit Summary for other counseling recommendations.   I provided 10 minutes of face-to-face time during this encounter.  Return in about 1 week (around 09/23/2018) for ROB in person.  GBS.  No future appointments.  Baltazar Najjar, MD [facility] for [facility] 09/16/2018

## 2018-09-19 ENCOUNTER — Inpatient Hospital Stay (HOSPITAL_COMMUNITY): Payer: Medicaid Other | Admitting: Anesthesiology

## 2018-09-19 ENCOUNTER — Encounter (HOSPITAL_COMMUNITY)
Admission: AD | Disposition: A | Discharge status: Home / Self Care | Payer: Self-pay | Source: Home / Self Care | Attending: Obstetrics and Gynecology

## 2018-09-19 ENCOUNTER — Other Ambulatory Visit: Payer: Self-pay

## 2018-09-19 ENCOUNTER — Encounter (HOSPITAL_COMMUNITY): Payer: Self-pay | Admitting: *Deleted

## 2018-09-19 ENCOUNTER — Inpatient Hospital Stay (HOSPITAL_COMMUNITY)
Admission: AD | Admit: 2018-09-19 | Discharge: 2018-09-22 | DRG: 784 | Disposition: A | Discharge status: Home / Self Care | Payer: Medicaid Other | Attending: Obstetrics and Gynecology | Admitting: Obstetrics and Gynecology

## 2018-09-19 DIAGNOSIS — O9081 Anemia of the puerperium: Secondary | ICD-10-CM | POA: Diagnosis not present

## 2018-09-19 DIAGNOSIS — Z302 Encounter for sterilization: Secondary | ICD-10-CM

## 2018-09-19 DIAGNOSIS — O34211 Maternal care for low transverse scar from previous cesarean delivery: Principal | ICD-10-CM | POA: Diagnosis present

## 2018-09-19 DIAGNOSIS — O321XX0 Maternal care for breech presentation, not applicable or unspecified: Secondary | ICD-10-CM | POA: Diagnosis present

## 2018-09-19 DIAGNOSIS — O24420 Gestational diabetes mellitus in childbirth, diet controlled: Secondary | ICD-10-CM | POA: Diagnosis present

## 2018-09-19 DIAGNOSIS — O34219 Maternal care for unspecified type scar from previous cesarean delivery: Secondary | ICD-10-CM | POA: Diagnosis present

## 2018-09-19 DIAGNOSIS — O322XX0 Maternal care for transverse and oblique lie, not applicable or unspecified: Secondary | ICD-10-CM | POA: Diagnosis present

## 2018-09-19 DIAGNOSIS — O24419 Gestational diabetes mellitus in pregnancy, unspecified control: Secondary | ICD-10-CM | POA: Diagnosis present

## 2018-09-19 DIAGNOSIS — D62 Acute posthemorrhagic anemia: Secondary | ICD-10-CM | POA: Diagnosis not present

## 2018-09-19 DIAGNOSIS — Z3A35 35 weeks gestation of pregnancy: Secondary | ICD-10-CM | POA: Diagnosis not present

## 2018-09-19 DIAGNOSIS — Z20828 Contact with and (suspected) exposure to other viral communicable diseases: Secondary | ICD-10-CM | POA: Diagnosis present

## 2018-09-19 DIAGNOSIS — O329XX0 Maternal care for malpresentation of fetus, unspecified, not applicable or unspecified: Secondary | ICD-10-CM | POA: Diagnosis present

## 2018-09-19 DIAGNOSIS — Z3009 Encounter for other general counseling and advice on contraception: Secondary | ICD-10-CM | POA: Diagnosis present

## 2018-09-19 DIAGNOSIS — O99214 Obesity complicating childbirth: Secondary | ICD-10-CM | POA: Diagnosis present

## 2018-09-19 DIAGNOSIS — E669 Obesity, unspecified: Secondary | ICD-10-CM | POA: Diagnosis present

## 2018-09-19 DIAGNOSIS — O0990 Supervision of high risk pregnancy, unspecified, unspecified trimester: Secondary | ICD-10-CM

## 2018-09-19 DIAGNOSIS — A749 Chlamydial infection, unspecified: Secondary | ICD-10-CM | POA: Diagnosis present

## 2018-09-19 DIAGNOSIS — O09899 Supervision of other high risk pregnancies, unspecified trimester: Secondary | ICD-10-CM

## 2018-09-19 LAB — CBC
HCT: 32.3 % — ABNORMAL LOW (ref 36.0–46.0)
Hemoglobin: 11 g/dL — ABNORMAL LOW (ref 12.0–15.0)
MCH: 31.9 pg (ref 26.0–34.0)
MCHC: 34.1 g/dL (ref 30.0–36.0)
MCV: 93.6 fL (ref 80.0–100.0)
Platelets: 188 10*3/uL (ref 150–400)
RBC: 3.45 MIL/uL — ABNORMAL LOW (ref 3.87–5.11)
RDW: 12.9 % (ref 11.5–15.5)
WBC: 11.2 10*3/uL — ABNORMAL HIGH (ref 4.0–10.5)
nRBC: 0 % (ref 0.0–0.2)

## 2018-09-19 LAB — SARS CORONAVIRUS 2 BY RT PCR (HOSPITAL ORDER, PERFORMED IN ~~LOC~~ HOSPITAL LAB): SARS Coronavirus 2: NEGATIVE

## 2018-09-19 LAB — CREATININE, SERUM
Creatinine, Ser: 0.68 mg/dL (ref 0.44–1.00)
GFR calc Af Amer: >60 mL/min (ref >60)
GFR calc non Af Amer: >60 mL/min (ref >60)

## 2018-09-19 LAB — ABO/RH: ABO/RH(D): B POS

## 2018-09-19 LAB — GLUCOSE, CAPILLARY: Glucose-Capillary: 122 mg/dL — ABNORMAL HIGH (ref 70–99)

## 2018-09-19 SURGERY — Surgical Case
Anesthesia: Regional

## 2018-09-19 MED ORDER — DEXAMETHASONE SODIUM PHOSPHATE 10 MG/ML IJ SOLN
INTRAMUSCULAR | Status: DC | PRN
  Administered 2018-09-19: 5 mg via INTRAVENOUS

## 2018-09-19 MED ORDER — TETANUS-DIPHTH-ACELL PERTUSSIS 5-2.5-18.5 LF-MCG/0.5 IM SUSP: 0.5000 mL | Freq: Once | INTRAMUSCULAR | Status: DC

## 2018-09-19 MED ORDER — ONDANSETRON HCL 4 MG/2ML IJ SOLN
INTRAMUSCULAR | Status: DC | PRN
  Administered 2018-09-19: 4 mg via INTRAVENOUS

## 2018-09-19 MED ORDER — DIPHENHYDRAMINE HCL 25 MG PO CAPS: 25.0000 mg | ORAL_CAPSULE | ORAL | Status: DC | PRN

## 2018-09-19 MED ORDER — KETOROLAC TROMETHAMINE 30 MG/ML IJ SOLN
INTRAMUSCULAR | Status: AC
  Filled 2018-09-19: qty 1

## 2018-09-19 MED ORDER — MORPHINE SULFATE (PF) 0.5 MG/ML IJ SOLN
INTRAMUSCULAR | Status: DC | PRN
  Administered 2018-09-19: .15 mg via INTRATHECAL

## 2018-09-19 MED ORDER — DIBUCAINE (PERIANAL) 1 % EX OINT: 1.0000 "application " | TOPICAL_OINTMENT | CUTANEOUS | Status: DC | PRN

## 2018-09-19 MED ORDER — FENTANYL CITRATE (PF) 100 MCG/2ML IJ SOLN: 25.0000 ug | INTRAMUSCULAR | Status: DC | PRN

## 2018-09-19 MED ORDER — SOD CITRATE-CITRIC ACID 500-334 MG/5ML PO SOLN
30.0000 mL | Freq: Once | ORAL | Status: AC
  Administered 2018-09-19: 30 mL via ORAL
  Filled 2018-09-19: qty 30

## 2018-09-19 MED ORDER — SODIUM CHLORIDE 0.9% FLUSH: 3.0000 mL | INTRAVENOUS | Status: DC | PRN

## 2018-09-19 MED ORDER — OXYTOCIN 40 UNITS IN NORMAL SALINE INFUSION - SIMPLE MED: 2.5000 [IU]/h | INTRAVENOUS | Status: AC

## 2018-09-19 MED ORDER — SIMETHICONE 80 MG PO CHEW
80.0000 mg | CHEWABLE_TABLET | Freq: Three times a day (TID) | ORAL | Status: DC
  Administered 2018-09-19 – 2018-09-21 (×7): 80 mg via ORAL
  Filled 2018-09-19 (×8): qty 1

## 2018-09-19 MED ORDER — MENTHOL 3 MG MT LOZG: 1.0000 | LOZENGE | OROMUCOSAL | Status: DC | PRN

## 2018-09-19 MED ORDER — COCONUT OIL OIL: 1.0000 "application " | TOPICAL_OIL | Status: DC | PRN

## 2018-09-19 MED ORDER — SENNOSIDES-DOCUSATE SODIUM 8.6-50 MG PO TABS
2.0000 | ORAL_TABLET | ORAL | Status: DC
  Administered 2018-09-20 – 2018-09-22 (×3): 2 via ORAL
  Filled 2018-09-19 (×4): qty 2

## 2018-09-19 MED ORDER — SODIUM CHLORIDE 0.9 % IV SOLN
INTRAVENOUS | Status: DC | PRN
  Administered 2018-09-19: 03:00:00 via INTRAVENOUS

## 2018-09-19 MED ORDER — PHENYLEPHRINE HCL-NACL 20-0.9 MG/250ML-% IV SOLN
INTRAVENOUS | Status: AC
  Filled 2018-09-19: qty 250

## 2018-09-19 MED ORDER — MEPERIDINE HCL 25 MG/ML IJ SOLN: 6.2500 mg | INTRAMUSCULAR | Status: DC | PRN

## 2018-09-19 MED ORDER — FENTANYL CITRATE (PF) 100 MCG/2ML IJ SOLN
INTRAMUSCULAR | Status: AC
  Filled 2018-09-19: qty 2

## 2018-09-19 MED ORDER — LACTATED RINGERS IV SOLN
INTRAVENOUS | Status: DC
  Administered 2018-09-19 (×2): via INTRAVENOUS

## 2018-09-19 MED ORDER — MORPHINE SULFATE (PF) 0.5 MG/ML IJ SOLN
INTRAMUSCULAR | Status: AC
  Filled 2018-09-19: qty 10

## 2018-09-19 MED ORDER — LACTATED RINGERS IV SOLN
INTRAVENOUS | Status: DC | PRN
  Administered 2018-09-19 (×3): via INTRAVENOUS

## 2018-09-19 MED ORDER — ENOXAPARIN SODIUM 40 MG/0.4ML ~~LOC~~ SOLN
40.0000 mg | SUBCUTANEOUS | Status: DC
  Administered 2018-09-19 – 2018-09-21 (×3): 40 mg via SUBCUTANEOUS
  Filled 2018-09-19 (×3): qty 0.4

## 2018-09-19 MED ORDER — GABAPENTIN 100 MG PO CAPS
100.0000 mg | ORAL_CAPSULE | Freq: Three times a day (TID) | ORAL | Status: DC
  Administered 2018-09-19 – 2018-09-21 (×9): 100 mg via ORAL
  Filled 2018-09-19 (×10): qty 1

## 2018-09-19 MED ORDER — SIMETHICONE 80 MG PO CHEW: 80.0000 mg | CHEWABLE_TABLET | ORAL | Status: DC | PRN

## 2018-09-19 MED ORDER — FENTANYL CITRATE (PF) 100 MCG/2ML IJ SOLN
INTRAMUSCULAR | Status: DC | PRN
  Administered 2018-09-19: 15 ug via INTRATHECAL

## 2018-09-19 MED ORDER — OXYCODONE-ACETAMINOPHEN 5-325 MG PO TABS: 1.0000 | ORAL_TABLET | ORAL | Status: DC | PRN

## 2018-09-19 MED ORDER — NALOXONE HCL 4 MG/10ML IJ SOLN
1.0000 ug/kg/h | INTRAVENOUS | Status: DC | PRN
  Filled 2018-09-19: qty 5

## 2018-09-19 MED ORDER — NALOXONE HCL 0.4 MG/ML IJ SOLN: 0.4000 mg | INTRAMUSCULAR | Status: DC | PRN

## 2018-09-19 MED ORDER — DEXAMETHASONE SODIUM PHOSPHATE 10 MG/ML IJ SOLN
INTRAMUSCULAR | Status: AC
  Filled 2018-09-19: qty 1

## 2018-09-19 MED ORDER — DIPHENHYDRAMINE HCL 50 MG/ML IJ SOLN: 12.5000 mg | INTRAMUSCULAR | Status: DC | PRN

## 2018-09-19 MED ORDER — OXYTOCIN 40 UNITS IN NORMAL SALINE INFUSION - SIMPLE MED
INTRAVENOUS | Status: AC
  Filled 2018-09-19: qty 1000

## 2018-09-19 MED ORDER — IBUPROFEN 800 MG PO TABS
800.0000 mg | ORAL_TABLET | Freq: Three times a day (TID) | ORAL | Status: AC
  Administered 2018-09-19 – 2018-09-21 (×7): 800 mg via ORAL
  Filled 2018-09-19 (×8): qty 1

## 2018-09-19 MED ORDER — ONDANSETRON HCL 4 MG/2ML IJ SOLN
INTRAMUSCULAR | Status: AC
  Filled 2018-09-19: qty 2

## 2018-09-19 MED ORDER — KETOROLAC TROMETHAMINE 30 MG/ML IJ SOLN
30.0000 mg | Freq: Four times a day (QID) | INTRAMUSCULAR | Status: AC | PRN
  Administered 2018-09-19: 30 mg via INTRAMUSCULAR

## 2018-09-19 MED ORDER — CEFAZOLIN SODIUM-DEXTROSE 2-4 GM/100ML-% IV SOLN
INTRAVENOUS | Status: AC
  Filled 2018-09-19: qty 100

## 2018-09-19 MED ORDER — PRENATAL MULTIVITAMIN CH
1.0000 | ORAL_TABLET | Freq: Every day | ORAL | Status: DC
  Administered 2018-09-19 – 2018-09-21 (×3): 1 via ORAL
  Filled 2018-09-19 (×4): qty 1

## 2018-09-19 MED ORDER — PHENYLEPHRINE HCL-NACL 20-0.9 MG/250ML-% IV SOLN
INTRAVENOUS | Status: DC | PRN
  Administered 2018-09-19: 60 ug/min via INTRAVENOUS

## 2018-09-19 MED ORDER — SODIUM CHLORIDE 0.9 % IV SOLN
INTRAVENOUS | Status: DC | PRN
  Administered 2018-09-19: 40 [IU] via INTRAVENOUS

## 2018-09-19 MED ORDER — DIPHENHYDRAMINE HCL 25 MG PO CAPS
25.0000 mg | ORAL_CAPSULE | Freq: Four times a day (QID) | ORAL | Status: DC | PRN
  Administered 2018-09-19: 25 mg via ORAL
  Filled 2018-09-19: qty 1

## 2018-09-19 MED ORDER — CEFAZOLIN SODIUM-DEXTROSE 2-3 GM-%(50ML) IV SOLR
INTRAVENOUS | Status: DC | PRN
  Administered 2018-09-19: 2 g via INTRAVENOUS

## 2018-09-19 MED ORDER — SIMETHICONE 80 MG PO CHEW
80.0000 mg | CHEWABLE_TABLET | ORAL | Status: DC
  Administered 2018-09-20 – 2018-09-22 (×3): 80 mg via ORAL
  Filled 2018-09-19 (×3): qty 1

## 2018-09-19 MED ORDER — KETOROLAC TROMETHAMINE 30 MG/ML IJ SOLN
30.0000 mg | Freq: Four times a day (QID) | INTRAMUSCULAR | Status: AC | PRN
  Administered 2018-09-19: 12:00:00 30 mg via INTRAVENOUS
  Filled 2018-09-19: qty 1

## 2018-09-19 MED ORDER — WITCH HAZEL-GLYCERIN EX PADS: 1.0000 "application " | MEDICATED_PAD | CUTANEOUS | Status: DC | PRN

## 2018-09-19 MED ORDER — ONDANSETRON HCL 4 MG/2ML IJ SOLN: 4.0000 mg | Freq: Three times a day (TID) | INTRAMUSCULAR | Status: DC | PRN

## 2018-09-19 MED ORDER — BUPIVACAINE IN DEXTROSE 0.75-8.25 % IT SOLN
INTRATHECAL | Status: DC | PRN
  Administered 2018-09-19: 1.6 mL via INTRATHECAL

## 2018-09-19 SURGICAL SUPPLY — 31 items
BRR ADH 6X5 SEPRAFILM 1 SHT (MISCELLANEOUS)
CHLORAPREP W/TINT 26ML (MISCELLANEOUS) ×4 IMPLANT
CLAMP CORD UMBIL (MISCELLANEOUS) IMPLANT
DRSG OPSITE POSTOP 4X10 (GAUZE/BANDAGES/DRESSINGS) ×4 IMPLANT
ELECT REM PT RETURN 9FT ADLT (ELECTROSURGICAL) ×4
ELECTRODE REM PT RTRN 9FT ADLT (ELECTROSURGICAL) ×2 IMPLANT
EXTRACTOR VACUUM M CUP 4 TUBE (SUCTIONS) IMPLANT
EXTRACTOR VACUUM M CUP 4' TUBE (SUCTIONS)
GLOVE BIOGEL PI IND STRL 6.5 (GLOVE) ×2 IMPLANT
GLOVE BIOGEL PI IND STRL 7.0 (GLOVE) ×2 IMPLANT
GLOVE BIOGEL PI INDICATOR 6.5 (GLOVE) ×2
GLOVE BIOGEL PI INDICATOR 7.0 (GLOVE) ×2
GLOVE SURG SS PI 6.0 STRL IVOR (GLOVE) ×4 IMPLANT
GOWN STRL REUS W/TWL LRG LVL3 (GOWN DISPOSABLE) ×8 IMPLANT
HEMOSTAT ARISTA ABSORB 3G PWDR (HEMOSTASIS) ×2 IMPLANT
KIT ABG SYR 3ML LUER SLIP (SYRINGE) IMPLANT
NDL HYPO 25X5/8 SAFETYGLIDE (NEEDLE) IMPLANT
NEEDLE HYPO 25X5/8 SAFETYGLIDE (NEEDLE) IMPLANT
NS IRRIG 1000ML POUR BTL (IV SOLUTION) ×4 IMPLANT
PACK C SECTION WH (CUSTOM PROCEDURE TRAY) ×4 IMPLANT
PAD ABD DERMACEA PRESS 5X9 (GAUZE/BANDAGES/DRESSINGS) ×2 IMPLANT
PAD OB MATERNITY 4.3X12.25 (PERSONAL CARE ITEMS) ×4 IMPLANT
PENCIL SMOKE EVAC W/HOLSTER (ELECTROSURGICAL) ×4 IMPLANT
RTRCTR C-SECT PINK 25CM LRG (MISCELLANEOUS) IMPLANT
SEPRAFILM MEMBRANE 5X6 (MISCELLANEOUS) IMPLANT
SUT PLAIN 0 NONE (SUTURE) ×2 IMPLANT
SUT VIC AB 0 CT1 36 (SUTURE) ×20 IMPLANT
SUT VIC AB 4-0 KS 27 (SUTURE) ×4 IMPLANT
TOWEL OR 17X24 6PK STRL BLUE (TOWEL DISPOSABLE) ×4 IMPLANT
TRAY FOLEY W/BAG SLVR 14FR LF (SET/KITS/TRAYS/PACK) ×4 IMPLANT
WATER STERILE IRR 1000ML POUR (IV SOLUTION) ×6 IMPLANT

## 2018-09-19 NOTE — Consult Note (Signed)
Neonatology Note:   Attendance at C-section:    I was asked by Dr. Elly Modena to attend this repeat C/S at 35 weeks due to labor with transverse lie. The mother is a G4P3003, GBS unk, Covid neg with good prenatal care complicated by GDM diet controlled, and treated chlamydia infection during pregnancy. ROM 0 hours before delivery, fluid clear. Infant not vigorous nor with good spontaneous cry and tone. Cord immediately cut and baby brought to warmer.  PPV started due to depressed state. Low tone, resps and HR.  Good response over course of the minute.  SAO2 placed, appropriate sats. Switched to CPAP.  Good air entry on auscultation.  Improving resps and activity. Minimal bulb suctioning. Stable on RA.  Ap 5/8. To CN to care of Pediatrician.  Mother updated.    Monia Sabal Katherina Mires, MD

## 2018-09-19 NOTE — MAU Note (Signed)
Pt came to room stating she had a lot of bleeding in her underwear and now states she hasn't felt baby move since 1600 09/18/18.

## 2018-09-19 NOTE — H&P (Signed)
Victoria CocoStephanie N Solis is a 28 y.o. female presenting for painful contractions and vaginal bleeding No fetal movement since 4pm  Hx previous C/S with subsequent VBAC and one other SVD Patient Active Problem List   Diagnosis Date Noted  . Unwanted fertility 08/22/2018  . Chlamydia infection during pregnancy 08/08/2018  . Gestational diabetes mellitus 08/08/2018  . Supervision of high risk pregnancy, antepartum 03/31/2018  . Previous cesarean delivery affecting pregnancy 03/31/2018  . Short interval between pregnancies affecting pregnancy, antepartum 03/31/2018   . OB History    Gravida  4   Para  3   Term  3   Preterm      AB      Living  3     SAB      TAB      Ectopic      Multiple  0   Live Births  3          Past Medical History:  Diagnosis Date  . Migraine    Past Surgical History:  Procedure Laterality Date  . CESAREAN SECTION     Family History: family history includes Diabetes in her father and mother; Hypertension in her father and mother; Kidney failure in her mother; Stroke in her mother. Social History:  reports that she has never smoked. She has never used smokeless tobacco. She reports that she does not drink alcohol or use drugs.     Maternal Diabetes: Yes:  Diabetes Type:  Diet controlled Genetic Screening: Normal Maternal Ultrasounds/Referrals: Normal Fetal Ultrasounds or other Referrals:  None Maternal Substance Abuse:  No Significant Maternal Medications:  None Significant Maternal Lab Results:  None Other Comments:  hx chlamydia  Review of Systems  Constitutional: Negative for chills and fever.  Respiratory: Negative for shortness of breath.   Cardiovascular: Negative for chest pain.  Gastrointestinal: Positive for abdominal pain. Negative for constipation, diarrhea, nausea and vomiting.  Musculoskeletal: Positive for back pain.  Neurological: Negative for dizziness and focal weakness.   Maternal Medical History:  Reason for  admission: Contractions.  Nausea.  Contractions: Onset was 1-2 hours ago.   Frequency: regular.   Perceived severity is strong.    Fetal activity: Perceived fetal activity is normal.   Last perceived fetal movement was within the past hour.    Prenatal complications: Bleeding and preterm labor.   No PIH or pre-eclampsia.   Prenatal Complications - Diabetes: gestational. Diabetes is managed by diet.        Blood pressure 119/74, pulse 93, temperature 98.4 F (36.9 C), temperature source Oral, resp. rate 17, weight 80.3 kg, not currently breastfeeding. Maternal Exam:  Uterine Assessment: Contraction strength is firm.  Contraction frequency is regular.   Abdomen: Patient reports no abdominal tenderness. Surgical scars: low transverse.   Fetal presentation: no presenting part  Introitus: Normal vulva. Normal vagina.  Ferning test: not done.  Nitrazine test: not done. Amniotic fluid character: not assessed.  Cervix: Cervix evaluated by digital exam.     Fetal Exam Fetal Monitor Review: Mode: ultrasound.   Baseline rate: 120.  Variability: moderate (6-25 bpm).   Pattern: accelerations present and no decelerations.    Fetal State Assessment: Category I - tracings are normal.     Physical Exam  Constitutional: She is oriented to person, place, and time. She appears well-developed and well-nourished. No distress.  HENT:  Head: Normocephalic.  Cardiovascular: Normal rate and regular rhythm.  Respiratory: Effort normal. No respiratory distress.  GI: Soft. She exhibits no distension. There  is no abdominal tenderness. There is no rebound and no guarding.  Genitourinary:    Vulva normal.     Genitourinary Comments: Complete/complete/high   Musculoskeletal: Normal range of motion.  Neurological: She is alert and oriented to person, place, and time.  Skin: Skin is warm and dry.  Psychiatric: She has a normal mood and affect.    Prenatal labs: ABO, Rh: B/Positive/-- (03/02  1139) Antibody: Negative (03/02 1139) Rubella: 1.56 (03/02 1139) RPR: Non Reactive (06/26 0948)  HBsAg: Negative (03/02 1139)  HIV: Non Reactive (06/26 0948)  GBS:     Assessment/Plan: [place] intrauterine pregnancy at [redacted]w[redacted]d Preterm labor Transverse Lie Bulging membranes  Admit  Plan repeat cesarean section Dr Elly Modena and Dione Plover arrived to consent and prepare for surgery Anesthesia notified     Victoria Solis 09/19/2018, 2:06 AM

## 2018-09-19 NOTE — MAU Note (Signed)
[facility] charge nurse called and stated they were ready for the patient in OR A.

## 2018-09-19 NOTE — MAU Note (Signed)
Surgical consent signed by Dr. Elly Modena.

## 2018-09-19 NOTE — Discharge Summary (Signed)
Postpartum Discharge Summary     Patient Name: Victoria Solis DOB: Oct 23, 1990 MRN: [id number]  Date of admission: 09/19/2018 Delivering Provider: CONSTANT, Vickii Chafe   Date of discharge: 09/21/2018  Admitting diagnosis: 34 WKS, CTX Intrauterine pregnancy: [redacted]w[redacted]d    Secondary diagnosis:  Active Problems:   Supervision of high risk pregnancy, antepartum   Previous cesarean delivery affecting pregnancy   Short interval between pregnancies affecting pregnancy, antepartum   Chlamydia infection during pregnancy   Gestational diabetes mellitus   Unwanted fertility   Previous cesarean section complicating pregnancy, antepartum condition or complication   Malpresentation of fetus  Additional problems:  Bilateral tubal ligation PPH     Discharge diagnosis: Preterm Pregnancy Delivered, GDM A1 and Bilateral tubal ligation                                                                                                Post partum procedures:PP BTL, blood transfusion (2u pRBC)  Augmentation: n/a  Complications: None  Hospital course:  Onset of Labor With Unplanned C/S  28y.o. yo G4P3003 at 366w3das admitted in Active Labor on 09/19/2018 and was found to have a fully dilated cervix with a bulging bag. On ultrasound patient was found to have a transverse presentation of infant.  The patient went for cesarean section due to Malpresentation, and delivered a Viable infant,09/19/2018  Details of operation can be found in separate operative note, [facility]. Patient had a post-operative course notable for hgb drop 11.0>5.6 and received 2u pRBC with appropriate rise to 7.8 on day of discharge.  She is ambulating without dizziness, lightheadedness, or other symptoms of her anemia, tolerating a regular diet, passing flatus, and urinating well.  Patient is discharged home in stable condition 09/21/18.  Magnesium Sulfate recieved: No BMZ received: No  Physical exam  Vitals:    09/20/18 1815 09/20/18 2041 09/20/18 2141 09/21/18 0645  BP: (!) 100/52 (!) 102/59  (!) 80/69  Pulse: 72 67  (!) 58  Resp: 20 18  18   Temp: 98.5 F (36.9 C) 100 F (37.8 C) 99.1 F (37.3 C) 98.1 F (36.7 C)  TempSrc:  Oral Oral   SpO2:  100%  100%  Weight:       General: alert, cooperative and no distress Lochia: appropriate Uterine Fundus: firm Incision: Healing well with no significant drainage, No significant erythema, Dressing is clean, dry, and intact DVT Evaluation: No evidence of DVT seen on physical exam. Labs: Lab Results  Component Value Date   WBC 11.0 (H) 09/21/2018   HGB 7.8 (L) 09/21/2018   HCT 22.9 (L) 09/21/2018   MCV 92.3 09/21/2018   PLT 125 (L) 09/21/2018   CMP Latest Ref Rng & Units 09/19/2018  Glucose 70 - 99 mg/dL -  BUN 6 - 23 mg/dL -  Creatinine 0.44 - 1.00 mg/dL 0.68  Sodium 137 - 147 mEq/L -  Potassium 3.7 - 5.3 mEq/L -  Chloride 96 - 112 mEq/L -  CO2 19 - 32 mEq/L -  Calcium 8.4 - 10.5 mg/dL -  Total Protein 6.0 - 8.3 g/dL -  Total Bilirubin 0.3 - 1.2 mg/dL -  Alkaline Phos 39 - 117 U/L -  AST 0 - 37 U/L -  ALT 0 - 35 U/L -    Discharge instruction: per After Visit Summary and "Baby and Me Booklet".  After visit meds:  Allergies as of 09/21/2018      Reactions   Amoxicillin Hives   Latex Itching   Penicillins Hives   Has patient had a PCN reaction causing immediate rash, facial/tongue/throat swelling, SOB or lightheadedness with hypotension: Yes Has patient had a PCN reaction causing severe rash involving mucus membranes or skin necrosis: Yes Has patient had a PCN reaction that required hospitalization: No Has patient had a PCN reaction occurring within the last 10 years: No If all of the above answers are "NO", then may proceed with Cephalosporin use.      Medication List    STOP taking these medications   Accu-Chek FastClix Lancets Misc   Accu-Chek Guide test strip Generic drug: glucose blood   Accu-Chek Guide w/Device Kit    Blood Pressure Monitor Misc   HAIR SKIN AND NAILS FORMULA PO     TAKE these medications   ferrous sulfate 325 (65 FE) MG tablet Take 1 tablet (325 mg total) by mouth every other day. What changed: when to take this   ibuprofen 800 MG tablet Commonly known as: ADVIL Take 1 tablet (800 mg total) by mouth every 8 (eight) hours.   ondansetron 4 MG disintegrating tablet Commonly known as: ZOFRAN-ODT Take 1 tablet (4 mg total) by mouth every 8 (eight) hours as needed for nausea or vomiting.   oxyCODONE-acetaminophen 5-325 MG tablet Commonly known as: PERCOCET/ROXICET Take 1-2 tablets by mouth every 4 (four) hours as needed for moderate pain.   Prenatal Gummies/DHA & FA 0.4-32.5 MG Chew Chew 1 tablet daily by mouth.       Diet: routine diet  Activity: Advance as tolerated. Pelvic rest for 6 weeks.   Outpatient follow up:2 weeks Follow up Appt: Future Appointments  Date Time Provider [place]  10/03/2018 10:30 AM Constant, Vickii Chafe, MD [facility] None  10/20/2018 10:00 AM Anyanwu, Sallyanne Havers, MD [facility] None  10/31/2018  9:00 AM [facility] None   Follow up Visit:   Please schedule this patient for Postpartum visit in: 6 weeks with the following provider: Any provider For C/S patients schedule nurse incision check in weeks 2 weeks: yes High risk pregnancy complicated by: GDM Delivery mode:  CS Anticipated Birth Control:  BTL done PP PP Procedures needed: 2 hour GTT  Schedule Integrated BH visit: no      Newborn Data: Live born female  Birth Weight:   APGAR: 5, 8  Newborn Delivery   Birth date/time: 09/19/2018 02:55:00 Delivery type: C-Section, Low Transverse Trial of labor: No C-section categorization: Repeat      Baby Feeding: Bottle Disposition:home w mother vs rooming in   09/21/2018 Clarnce Flock, MD

## 2018-09-19 NOTE — Anesthesia Procedure Notes (Signed)
Spinal  Patient location during procedure: OR Start time: 09/19/2018 2:23 AM End time: 09/19/2018 2:27 AM Staffing Anesthesiologist: Josephine Igo, MD Performed: anesthesiologist  Preanesthetic Checklist Completed: patient identified, site marked, surgical consent, pre-op evaluation, timeout performed, IV checked, risks and benefits discussed and monitors and equipment checked Spinal Block Patient position: sitting Prep: site prepped and draped and DuraPrep Patient monitoring: heart rate, cardiac monitor, continuous pulse ox and blood pressure Approach: midline Location: L3-4 Injection technique: single-shot Needle Needle type: Pencan  Needle gauge: 24 G Needle length: 9 cm Needle insertion depth: 5.5 cm Assessment Sensory level: T4 Additional Notes Patient tolerated procedure well. Adequate sensory level.

## 2018-09-19 NOTE — Transfer of Care (Signed)
Immediate Anesthesia Transfer of Care Note  Patient: Victoria Solis  Procedure(s) Performed: CESAREAN SECTION (N/A ) CESAREAN SECTION WITH BILATERAL TUBAL LIGATION (Bilateral )  Patient Location: PACU  Anesthesia Type:Spinal  Level of Consciousness: awake, alert  and oriented  Airway & Oxygen Therapy: Patient Spontanous Breathing  Post-op Assessment: Report given to RN and Post -op Vital signs reviewed and stable  Post vital signs: Reviewed and stable  Last Vitals:  Vitals Value Taken Time  BP 97/55 09/19/18 0402  Temp    Pulse 62 09/19/18 0405  Resp 14 09/19/18 0405  SpO2 95 % 09/19/18 0405  Vitals shown include unvalidated device data.  Last Pain:  Vitals:   09/19/18 0134  TempSrc:   PainSc: 10-Worst pain ever      Patients Stated Pain Goal: 0 (85/63/14 9702)  Complications: No apparent anesthesia complications

## 2018-09-19 NOTE — Anesthesia Postprocedure Evaluation (Signed)
Anesthesia Post Note  Patient: RACHYL WUEBKER  Procedure(s) Performed: CESAREAN SECTION (N/A ) CESAREAN SECTION WITH BILATERAL TUBAL LIGATION (Bilateral )     Patient location during evaluation: PACU Anesthesia Type: Spinal Level of consciousness: oriented and awake and alert Pain management: pain level controlled Vital Signs Assessment: post-procedure vital signs reviewed and stable Respiratory status: spontaneous breathing, respiratory function stable and nonlabored ventilation Cardiovascular status: blood pressure returned to baseline and stable Postop Assessment: no headache, no backache, no apparent nausea or vomiting, spinal receding and patient able to bend at knees Anesthetic complications: no    Last Vitals:  Vitals:   09/19/18 0415 09/19/18 0430  BP: (!) 98/58 (!) 97/59  Pulse: (!) 57 (!) 56  Resp: 14 17  Temp:  36.9 C  SpO2: 98% 100%    Last Pain:  Vitals:   09/19/18 0430  TempSrc:   PainSc: 0-No pain   Pain Goal: Patients Stated Pain Goal: 0 (09/19/18 0134)  LLE Motor Response: Purposeful movement (09/19/18 0430) LLE Sensation: Tingling (09/19/18 0430) RLE Motor Response: Purposeful movement (09/19/18 0430) RLE Sensation: Tingling (09/19/18 0430)     Epidural/Spinal Function Cutaneous sensation: Able to Wiggle Toes (09/19/18 0430), Patient able to flex knees: Yes (09/19/18 0430), Patient able to lift hips off bed: No (09/19/18 0430), Back pain beyond tenderness at insertion site: No (09/19/18 0430), Progressively worsening motor and/or sensory loss: No (09/19/18 0430), Bowel and/or bladder incontinence post epidural: No (09/19/18 0430)  Merrik [place] A.

## 2018-09-19 NOTE — Lactation Note (Signed)
This note was copied from a baby's chart. Lactation Consultation Note  Patient Name: Victoria Solis [id number] Date: 09/19/2018   [facility] Note:  Mother's preference on admission was breast/bottle.  However, with chart review I noticed mother has only been formula feeding.  Verified with the RN that mother's current feeding preference is bottle.  Lactation services will not be needed.                    Ameka Krigbaum R Najib Colmenares 09/19/2018, 12:20 PM

## 2018-09-19 NOTE — Anesthesia Preprocedure Evaluation (Signed)
Anesthesia Evaluation  Patient identified by MRN, date of birth, ID band Patient awake    Reviewed: Allergy & Precautions, NPO status , Patient's Chart, lab work & pertinent test results  Airway Mallampati: II  TM Distance: >3 FB Neck ROM: Full    Dental no notable dental hx.    Pulmonary neg pulmonary ROS,    Pulmonary exam normal breath sounds clear to auscultation       Cardiovascular hypertension, Normal cardiovascular exam Rhythm:Regular Rate:Normal     Neuro/Psych  Headaches, negative psych ROS   GI/Hepatic Neg liver ROS, GERD  ,  Endo/Other  diabetes, GestationalObesity  Renal/GU negative Renal ROS  negative genitourinary   Musculoskeletal negative musculoskeletal ROS (+)   Abdominal (+) + obese,   Peds  Hematology  (+) anemia ,   Anesthesia Other Findings   Reproductive/Obstetrics (+) Pregnancy 35 3/7 weeks Previous C/Section Successful VBAC x 2 Transverse lie                             Anesthesia Physical Anesthesia Plan  ASA: II and emergent  Anesthesia Plan:    Post-op Pain Management:    Induction:   PONV Risk Score and Plan: 4 or greater and Ondansetron, Treatment may vary due to age or medical condition and Scopolamine patch - Pre-op  Airway Management Planned: Natural Airway  Additional Equipment:   Intra-op Plan:   Post-operative Plan:   Informed Consent: I have reviewed the patients History and Physical, chart, labs and discussed the procedure including the risks, benefits and alternatives for the proposed anesthesia with the patient or authorized representative who has indicated his/her understanding and acceptance.       Plan Discussed with: Anesthesiologist  Anesthesia Plan Comments:         Anesthesia Quick Evaluation

## 2018-09-19 NOTE — MAU Note (Addendum)
[place] charge RN notified.  NICU charge nurse notified.  L&D charge nurse notified.  OB anesthesiologist notified.

## 2018-09-19 NOTE — MAU Note (Signed)
Pt reported to MAU with contractions  10/10. +FM. No vaginal bleeding or discharge.

## 2018-09-19 NOTE — Op Note (Signed)
Victoria CocoStephanie N Ambs PROCEDURE DATE: 09/19/2018  PREOPERATIVE DIAGNOSIS: Intrauterine pregnancy at  822w3d weeks gestation entering second stage of labor; malpresentation: transverse and desire for permanent sterilization  POSTOPERATIVE DIAGNOSIS: The same  PROCEDURE:     Cesarean Section and bilateral tubal ligation using Pomeroy method   SURGEON:  Dr. Catalina AntiguaPeggy Jammy Stlouis  ASSISTANT: Dr. Crissie ReeseEckstat  INDICATIONS: Victoria Solis is a 28 y.o. [phone] at 352w3d scheduled for cesarean section secondary to second stage of labor and  malpresentation: transverse lie.  The risks of cesarean section discussed with the patient included but were not limited to: bleeding which may require transfusion or reoperation; infection which may require antibiotics; injury to bowel, bladder, ureters or other surrounding organs; injury to the fetus; need for additional procedures including hysterectomy in the event of a life-threatening hemorrhage; placental abnormalities wth subsequent pregnancies, incisional problems, thromboembolic phenomenon and other postoperative/anesthesia complications. Patient also desires permanent sterilization. Risks and benefits of procedure discussed with patient including permanence of method, bleeding, infection, injury to surrounding organs and need for additional procedures. Risk failure of 0.5-1% with increased risk of ectopic gestation if pregnancy occurs was also discussed with patient. The patient concurred with the proposed plan, giving informed written consent for the procedure.    FINDINGS:  Viable female infant in breech presentation.  Apgars 7 and 8.  Clear amniotic fluid.  Intact placenta, three vessel cord and a true knot.  Normal uterus, fallopian tubes and ovaries bilaterally.  ANESTHESIA:    Spinal INTRAVENOUS FLUIDS:2600 ml ESTIMATED BLOOD LOSS: 1047 ml URINE OUTPUT:  100 ml SPECIMENS: Placenta and portions of left and right fallopian tubes sent to pathology COMPLICATIONS:  None immediate  PROCEDURE IN DETAIL:  The patient received intravenous antibiotics and had sequential compression devices applied to her lower extremities while in the preoperative area.  She was then taken to the operating room where anesthesia was induced and was found to be adequate. A foley catheter was placed into her bladder and attached to Carmelina Balducci gravity. She was then placed in a dorsal supine position with a leftward tilt, and prepped and draped in a sterile manner. After an adequate timeout was performed, a Pfannenstiel skin incision was made with scalpel and carried through to the underlying layer of fascia. The fascia was incised in the midline and this incision was extended bilaterally using the Mayo scissors. Kocher clamps were applied to the superior aspect of the fascial incision and the underlying rectus muscles were dissected off bluntly. A similar process was carried out on the inferior aspect of the facial incision. The rectus muscles were separated in the midline bluntly and the peritoneum was entered bluntly. The Alexis self-retaining retractor was introduced into the abdominal cavity. Attention was turned to the lower uterine segment where a bladder flap was created, and a transverse hysterotomy was made with a scalpel and extended bilaterally bluntly. The infant was successfully delivered, and cord was clamped and cut and infant was handed over to awaiting neonatology team. Uterine massage was then administered and the placenta delivered intact with three-vessel cord. The uterus was cleared of clot and debris.  The hysterotomy was closed with 0 Vicryl in a running locked fashion, and an imbricating layer was also placed with a 0 Vicryl. Overall, excellent hemostasis was noted. The pelvis copiously irrigated and cleared of all clot and debris. The patient's left fallopian tube was then identified, brought to the incision, and grasped with a Babcock clamp. The tube was then followed out to  the  fimbria. The Babcock clamp was then used to grasp the tube approximately 4 cm from the cornual region. A 3 cm segment of the tube was then ligated with free tie of plain gut suture, transected and excised. Good hemostasis was noted and the tube was returned to the abdomen. The right fallopian tube was then identified to its fimbriated end, ligated, and a 3 cm segment excised in a similar fashion. Excellent hemostasis was noted, and the tube returned to the abdomen.Hemostasis was confirmed on all surfaces.  The peritoneum and the muscles were reapproximated using 0 vicryl interrupted stitches. The fascia was then closed using 0 Vicryl in a running fashion.  The skin was closed in a subcuticular fashion using 3.0 Vicryl. The patient tolerated the procedure well. Sponge, lap, instrument and needle counts were correct x 2. She was taken to the recovery room in stable condition.    Mischelle Reeg ConstantMD  09/19/2018 3:40 AM

## 2018-09-20 ENCOUNTER — Encounter (HOSPITAL_COMMUNITY): Payer: Self-pay | Admitting: Obstetrics and Gynecology

## 2018-09-20 LAB — CBC
HCT: 15.1 % — ABNORMAL LOW (ref 36.0–46.0)
HCT: 17.2 % — ABNORMAL LOW (ref 36.0–46.0)
Hemoglobin: 5 g/dL — CL (ref 12.0–15.0)
Hemoglobin: 5.6 g/dL — CL (ref 12.0–15.0)
MCH: 32.7 pg (ref 26.0–34.0)
MCH: 32 pg (ref 26.0–34.0)
MCHC: 33.1 g/dL (ref 30.0–36.0)
MCHC: 32.6 g/dL (ref 30.0–36.0)
MCV: 98.7 fL (ref 80.0–100.0)
MCV: 98.3 fL (ref 80.0–100.0)
Platelets: 130 10*3/uL — ABNORMAL LOW (ref 150–400)
Platelets: 141 10*3/uL — ABNORMAL LOW (ref 150–400)
RBC: 1.53 MIL/uL — ABNORMAL LOW (ref 3.87–5.11)
RBC: 1.75 MIL/uL — ABNORMAL LOW (ref 3.87–5.11)
RDW: 13.6 % (ref 11.5–15.5)
RDW: 13.7 % (ref 11.5–15.5)
WBC: 8.7 10*3/uL (ref 4.0–10.5)
WBC: 10.2 10*3/uL (ref 4.0–10.5)
nRBC: 0 % (ref 0.0–0.2)
nRBC: 0 % (ref 0.0–0.2)

## 2018-09-20 LAB — PREPARE RBC (CROSSMATCH)

## 2018-09-20 MED ORDER — ACETAMINOPHEN 325 MG PO TABS
650.0000 mg | ORAL_TABLET | Freq: Once | ORAL | Status: AC
  Administered 2018-09-20: 650 mg via ORAL
  Filled 2018-09-20: qty 2

## 2018-09-20 MED ORDER — SODIUM CHLORIDE 0.9% IV SOLUTION: Freq: Once | INTRAVENOUS | Status: DC

## 2018-09-20 NOTE — Discharge Instructions (Signed)

## 2018-09-20 NOTE — Progress Notes (Addendum)
POSTPARTUM PROGRESS NOTE  Post Partum Day 1 Subjective:  Victoria Solis is a 28 y.o. [phone] [redacted]w[redacted]d s/p RLTCS and BTL.  No acute events overnight.  Pt denies problems with ambulating, voiding or po intake.  She denies nausea or vomiting.  Pain is well controlled.  She has had flatus. She has had bowel movement.  Lochia Moderate.   Objective: Blood pressure (!) 97/51, pulse 66, temperature 98.7 F (37.1 C), temperature source Oral, resp. rate 18, weight 80.3 kg, SpO2 99 %, not currently breastfeeding.  Physical Exam:  General: alert, cooperative and no distress Lochia:normal flow Chest: breathing comfortably on room air Heart: RRR Abdomen: +BS, soft, nontender,  Uterine Fundus: firm, below umbilicus DVT Evaluation: No calf swelling or tenderness Extremities: no edema  Recent Labs    09/19/18 0150  HGB 11.0*  HCT 32.3*    Assessment/Plan:  ASSESSMENT: Victoria Solis is a 28 y.o. [id number] [redacted]w[redacted]d s/p LTCS  MOF: bottle MOC: s/p BTL Dispo: possible DC today, if Hgb is appropriate.    LOS: 1 day   Matilde Haymaker, MD 09/20/2018, 8:06 AM   Attestation of Attending Supervision of Provider:  Evaluation, management, and procedures were performed by this provider under my supervision and collaboration.  I have reviewed the provider's note and chart, and I agree with the management and plan.  Patient independently seen and examined. She is feeling well this am, reports minimal pain only with moving. Tolerating regularly diet without nausea/vomiting. Voiding and passing gas without issue. Denies any symptoms of anemia, ambulating without issue.   BP (!) 97/51 (BP Location: Right Arm)   Pulse 66   Temp 98.7 F (37.1 C) (Oral)   Resp 18   Wt 80.3 kg   LMP  (LMP Unknown)   SpO2 99%   BMI 30.38 kg/m  Gen: alert, oriented Abd: soft, fundus below umbilicus, incision with dressing on, appears well approximated without any active bleeding  CBC Latest Ref Rng & Units  09/20/2018 09/20/2018 09/19/2018  WBC 4.0 - 10.5 K/uL 10.2 8.7 11.2(H)  Hemoglobin 12.0 - 15.0 g/dL 5.6(LL) 5.0(LL) 11.0(L)  Hematocrit 36.0 - 46.0 % 17.2(L) 15.1(L) 32.3(L)  Platelets 150 - 400 K/uL 141(L) 130(L) 188   A/P: 28 yo G4P4 POD#1 s/p RCS+BTL. Doing well but with critically low H/H this am, confirmed on repeat. No concern for active bleeding as she is generally feeling well with soft abdomen. Recommended blood transfusion to patient if value remains critically low, reviewed risks/benefits to blood including transfusion reaction, transmission of infectious diseases. She is agreeable to proceeding. Will proceed with 2 units PRBCs and repeat CBC in am.    Feliz Beam, M.D. [facility]ending [facility] [employer] (Faculty Practice)  09/20/2018 11:10 AM

## 2018-09-21 LAB — BPAM RBC
Blood Product Expiration Date: 202008302359
Blood Product Expiration Date: 202008302359
ISSUE DATE / TIME: 202008221432
ISSUE DATE / TIME: 202008221750
Unit Type and Rh: 7300
Unit Type and Rh: 7300

## 2018-09-21 LAB — CBC
HCT: 22.9 % — ABNORMAL LOW (ref 36.0–46.0)
Hemoglobin: 7.8 g/dL — ABNORMAL LOW (ref 12.0–15.0)
MCH: 31.5 pg (ref 26.0–34.0)
MCHC: 34.1 g/dL (ref 30.0–36.0)
MCV: 92.3 fL (ref 80.0–100.0)
Platelets: 125 10*3/uL — ABNORMAL LOW (ref 150–400)
RBC: 2.48 MIL/uL — ABNORMAL LOW (ref 3.87–5.11)
RDW: 15.2 % (ref 11.5–15.5)
WBC: 11 10*3/uL — ABNORMAL HIGH (ref 4.0–10.5)
nRBC: 0.2 % (ref 0.0–0.2)

## 2018-09-21 LAB — TYPE AND SCREEN
ABO/RH(D): B POS
Antibody Screen: NEGATIVE
Unit division: 0
Unit division: 0

## 2018-09-21 MED ORDER — IBUPROFEN 800 MG PO TABS: 800.0000 mg | ORAL_TABLET | Freq: Three times a day (TID) | ORAL | 0 refills | Status: DC

## 2018-09-21 MED ORDER — OXYCODONE-ACETAMINOPHEN 5-325 MG PO TABS: 1.0000 | ORAL_TABLET | ORAL | 0 refills | Status: DC | PRN

## 2018-09-21 MED ORDER — FERROUS SULFATE 325 (65 FE) MG PO TABS: 325.0000 mg | ORAL_TABLET | ORAL | 1 refills | Status: DC

## 2018-09-22 ENCOUNTER — Ambulatory Visit (HOSPITAL_COMMUNITY): Payer: Medicaid Other

## 2018-09-22 NOTE — Discharge Summary (Signed)
Postpartum Discharge Summary                           Patient Name: Victoria Solis DOB: 11-Jul-1990 MRN: [id number]  Date of admission: 09/19/2018 Delivering Provider: CONSTANT, Vickii Chafe   Date of discharge: 09/22/2018  Admitting diagnosis: 63 WKS, CTX Intrauterine pregnancy: [redacted]w[redacted]d    Secondary diagnosis:  Active Problems:   Supervision of high risk pregnancy, antepartum   Previous cesarean delivery affecting pregnancy   Short interval between pregnancies affecting pregnancy, antepartum   Chlamydia infection during pregnancy   Gestational diabetes mellitus   Unwanted fertility   Previous cesarean section complicating pregnancy, antepartum condition or complication   Malpresentation of fetus  Additional problems:  Bilateral tubal ligation PPH                                      Discharge diagnosis: Preterm Pregnancy Delivered, GDM A1 and Bilateral tubal ligation                                                                                                Post partum procedures:PP BTL, blood transfusion (2u pRBC)  Augmentation: n/a  Complications: None  Hospital course:  Onset of Labor With Unplanned C/S  28y.o. yo G4P3003 at 335w3das admitted in Active Labor on 09/19/2018 and was found to have a fully dilated cervix with a bulging bag. On ultrasound patient was found to have a transverse presentation of infant.  The patient went for cesarean section due to Malpresentation, and delivered a Viable infant,09/19/2018  Details of operation can be found in separate operative note, [facility] Patient had a post-operative course notable for hgb drop 11.0>5.6 and received 2u pRBC with appropriate rise to 7.8 on day of discharge.  She is ambulating without dizziness, lightheadedness, or other symptoms of her anemia, tolerating a regular diet, passing flatus, and urinating well.  Patient is discharged home in stable condition 09/21/18.  Magnesium Sulfate  recieved: No BMZ received: No  Physical exam        Vitals:   09/20/18 1815 09/20/18 2041 09/20/18 2141 09/21/18 0645  BP: (!) 100/52 (!) 102/59  (!) 80/69  Pulse: 72 67  (!) 58  Resp: 20 18  18   Temp: 98.5 F (36.9 C) 100 F (37.8 C) 99.1 F (37.3 C) 98.1 F (36.7 C)  TempSrc:  Oral Oral   SpO2:  100%  100%  Weight:       General: alert, cooperative and no distress Lochia: appropriate Uterine Fundus: firm Incision: Healing well with no significant drainage, No significant erythema, Dressing is clean, dry, and intact DVT Evaluation: No evidence of DVT seen on physical exam on 09/22/18. Labs: Recent Labs       Lab Results  Component Value Date   WBC 11.0 (H) 09/21/2018   HGB 7.8 (L) 09/21/2018   HCT 22.9 (L) 09/21/2018   MCV 92.3 09/21/2018   PLT 125 (L) 09/21/2018  CMP Latest Ref Rng & Units 09/19/2018  Glucose 70 - 99 mg/dL -  BUN 6 - 23 mg/dL -  Creatinine 0.44 - 1.00 mg/dL 0.68  Sodium 137 - 147 mEq/L -  Potassium 3.7 - 5.3 mEq/L -  Chloride 96 - 112 mEq/L -  CO2 19 - 32 mEq/L -  Calcium 8.4 - 10.5 mg/dL -  Total Protein 6.0 - 8.3 g/dL -  Total Bilirubin 0.3 - 1.2 mg/dL -  Alkaline Phos 39 - 117 U/L -  AST 0 - 37 U/L -  ALT 0 - 35 U/L -    Discharge instruction: per After Visit Summary and "Baby and Me Booklet".  After visit meds:       Allergies as of 09/21/2018      Reactions   Amoxicillin Hives   Latex Itching   Penicillins Hives   Has patient had a PCN reaction causing immediate rash, facial/tongue/throat swelling, SOB or lightheadedness with hypotension: Yes Has patient had a PCN reaction causing severe rash involving mucus membranes or skin necrosis: Yes Has patient had a PCN reaction that required hospitalization: No Has patient had a PCN reaction occurring within the last 10 years: No If all of the above answers are "NO", then may proceed with Cephalosporin use.         Medication List    STOP taking these  medications   Accu-Chek FastClix Lancets Misc   Accu-Chek Guide test strip Generic drug: glucose blood   Accu-Chek Guide w/Device Kit   Blood Pressure Monitor Misc   HAIR SKIN AND NAILS FORMULA PO     TAKE these medications   ferrous sulfate 325 (65 FE) MG tablet Take 1 tablet (325 mg total) by mouth every other day. What changed: when to take this   ibuprofen 800 MG tablet Commonly known as: ADVIL Take 1 tablet (800 mg total) by mouth every 8 (eight) hours.   ondansetron 4 MG disintegrating tablet Commonly known as: ZOFRAN-ODT Take 1 tablet (4 mg total) by mouth every 8 (eight) hours as needed for nausea or vomiting.   oxyCODONE-acetaminophen 5-325 MG tablet Commonly known as: PERCOCET/ROXICET Take 1-2 tablets by mouth every 4 (four) hours as needed for moderate pain.   Prenatal Gummies/DHA & FA 0.4-32.5 MG Chew Chew 1 tablet daily by mouth.       Diet: routine diet  Activity: Advance as tolerated. Pelvic rest for 6 weeks.   Outpatient follow up:2 weeks Follow up Appt:       Future Appointments  Date Time Provider [place]  10/03/2018 10:30 AM Constant, Vickii Chafe, MD [place] None  10/20/2018 10:00 AM Anyanwu, Sallyanne Havers, MD [facility] None  10/31/2018  9:00 AM [facility] None   Follow up Visit:   Please schedule this patient for Postpartum visit in: 6 weeks with the following provider: Any provider For C/S patients schedule nurse incision check in weeks 2 weeks: yes High risk pregnancy complicated by: GDM Delivery mode: CS Anticipated Birth Control: BTL done PP PP Procedures needed: 2 hour GTT  Schedule Integrated BH visit: no     Newborn Data: Live born female  Birth Weight:   APGAR: 5, 8  Newborn Delivery   Birth date/time: 09/19/2018 02:55:00 Delivery type: C-Section, Low Transverse Trial of labor: No C-section categorization: Repeat      Baby Feeding: Bottle Disposition: Cancelled discharge on 09/21/18.  Pt  stayed until this morning.  Now pt discharged on 09/22/18, may be rooming in if infant not discharged  today.   Fatima Blank, CNM 8:10 AM

## 2018-09-24 ENCOUNTER — Encounter: Payer: Medicaid Other | Admitting: Certified Nurse Midwife

## 2018-09-26 ENCOUNTER — Telehealth: Payer: Self-pay | Admitting: *Deleted

## 2018-09-26 NOTE — Telephone Encounter (Signed)
Pt called to office stating she is having pain from c/s and wants an appt.  Pt made aware no available appt today. Pt states she is taking Ibuprofen and pain medication with little to no relief. Pt states "it helps her back but not her stomach pain".  Pt was advised if she is in severe pain or not controlled with medication, she should be seen at the hospital for evaluation.  Pt states understanding.

## 2018-10-03 ENCOUNTER — Ambulatory Visit: Payer: Medicaid Other | Admitting: Obstetrics and Gynecology

## 2018-10-09 ENCOUNTER — Encounter: Payer: Self-pay | Admitting: Family Medicine

## 2018-10-09 ENCOUNTER — Other Ambulatory Visit: Payer: Self-pay

## 2018-10-09 ENCOUNTER — Ambulatory Visit (INDEPENDENT_AMBULATORY_CARE_PROVIDER_SITE_OTHER): Payer: Medicaid Other | Admitting: Family Medicine

## 2018-10-09 VITALS — BP 117/6 | HR 54 | Ht 62.0 in | Wt 161.0 lb

## 2018-10-09 DIAGNOSIS — Z4889 Encounter for other specified surgical aftercare: Secondary | ICD-10-CM

## 2018-10-09 DIAGNOSIS — Z9889 Other specified postprocedural states: Secondary | ICD-10-CM

## 2018-10-09 NOTE — Progress Notes (Signed)
3 Weeks PP, presents for Incision check.  Reports no problems with incision.

## 2018-10-09 NOTE — Progress Notes (Signed)
   Subjective:    Patient ID: [name], female    DOB: March 26, 1990, 28 y.o.   MRN: [id number]  HPI Patient is almost 3 weeks postpartum from repeat cesarean section with BTL. Reports no problems with incision. No draining, redness. Pain controlled.   Review of Systems     Objective:   Physical Exam Constitutional:      Appearance: Normal appearance.  Cardiovascular:     Rate and Rhythm: Normal rate.     Pulses: Normal pulses.  Abdominal:     Comments: pfannenstiel incision clean, dry, intact. Appears well healed.  Neurological:     Mental Status: She is alert.        Assessment & Plan:  1. Encounter for postoperative wound check Healing well. No concerns. Pt desires to go back to work - has job w/o heavy lifting. Patient cleared to return to work next week.

## 2018-10-20 ENCOUNTER — Ambulatory Visit: Payer: Medicaid Other | Admitting: Obstetrics & Gynecology

## 2018-10-27 ENCOUNTER — Other Ambulatory Visit: Payer: Self-pay

## 2018-10-27 ENCOUNTER — Ambulatory Visit (INDEPENDENT_AMBULATORY_CARE_PROVIDER_SITE_OTHER): Payer: Medicaid Other | Admitting: Advanced Practice Midwife

## 2018-10-27 DIAGNOSIS — Z8619 Personal history of other infectious and parasitic diseases: Secondary | ICD-10-CM | POA: Insufficient documentation

## 2018-10-27 DIAGNOSIS — Z1389 Encounter for screening for other disorder: Secondary | ICD-10-CM

## 2018-10-27 DIAGNOSIS — N939 Abnormal uterine and vaginal bleeding, unspecified: Secondary | ICD-10-CM | POA: Insufficient documentation

## 2018-10-27 DIAGNOSIS — O9903 Anemia complicating the puerperium: Secondary | ICD-10-CM

## 2018-10-27 DIAGNOSIS — Z9851 Tubal ligation status: Secondary | ICD-10-CM | POA: Insufficient documentation

## 2018-10-27 NOTE — Progress Notes (Deleted)
Post Partum Exam  Victoria Solis is a 28 y.o. [phone] female who presents for a postpartum visit. She is 6 weeks postpartum following a low cervical transverse Cesarean section. I have fully reviewed the prenatal and intrapartum course. The delivery was at 35 gestational weeks.  Anesthesia: spinal. Postpartum course has been doing well. Baby's course has been doing well Baby is feeding by bottle - Gerber Gentle. Bleeding staining only. Bowel function is normal. Bladder function is normal. Patient is sexually active. Contraception method is tubal ligation. Postpartum depression screening:neg,score 0.  {Common ambulatory SmartLinks:19316} Last pap smear done 11/2017 and was Normal  Review of Systems {ros; complete:30496}    Objective:  not currently breastfeeding.  General:  {gen appearance:16600}   Breasts:  {breast exam:1202::"inspection negative, no nipple discharge or bleeding, no masses or nodularity palpable"}  Lungs: {lung exam:16931}  Heart:  {heart exam:5510}  Abdomen: {abdomen exam:16834}   Vulva:  {labia exam:12198}  Vagina: {vagina exam:12200}  Cervix:  {cervix exam:14595}  Corpus: {uterus exam:12215}  Adnexa:  {adnexa exam:12223}  Rectal Exam: {rectal/vaginal exam:12274}        Assessment:    *** postpartum exam. Pap smear {done:10129} at today's visit.   Plan:   1. Contraception: {method:5051} 2. *** 3. Follow up in: {1-10:13787} {time; units:19136} or as needed.

## 2018-10-27 NOTE — Patient Instructions (Signed)
Abnormal Uterine Bleeding Abnormal uterine bleeding is unusual bleeding from the uterus. It includes:  Bleeding or spotting between periods.  Bleeding after sex.  Bleeding that is heavier than normal.  Periods that last longer than usual.  Bleeding after menopause. Abnormal uterine bleeding can affect women at various stages in life, including teenagers, women in their reproductive years, pregnant women, and women who have reached menopause. Common causes of abnormal uterine bleeding include:  Pregnancy.  Growths of tissue (polyps).  A noncancerous tumor in the uterus (fibroid).  Infection.  Cancer.  Hormonal imbalances. Any type of abnormal bleeding should be evaluated by a health care provider. Many cases are minor and simple to treat, while others are more serious. Treatment will depend on the cause of the bleeding. Follow these instructions at home:  Monitor your condition for any changes.  Do not use tampons, douche, or have sex if told by your health care provider.  Change your pads often.  Get regular exams that include pelvic exams and cervical cancer screening.  Keep all follow-up visits as told by your health care provider. This is important. Contact a health care provider if:  Your bleeding lasts for more than one week.  You feel dizzy at times.  You feel nauseous or you vomit. Get help right away if:  You pass out.  Your bleeding soaks through a pad every hour.  You have abdominal pain.  You have a fever.  You become sweaty or weak.  You pass large blood clots from your vagina. Summary  Abnormal uterine bleeding is unusual bleeding from the uterus.  Any type of abnormal bleeding should be evaluated by a health care provider. Many cases are minor and simple to treat, while others are more serious.  Treatment will depend on the cause of the bleeding. This information is not intended to replace advice given to you by your health care provider.  Make sure you discuss any questions you have with your health care provider. Document Released: 01/15/2005 Document Revised: 04/24/2017 Document Reviewed: 02/17/2016 Elsevier Patient Education  2020 Elsevier Inc.  

## 2018-10-27 NOTE — Progress Notes (Signed)
Post Partum Exam  Victoria Solis is a 28 y.o. [phone] female who presents for a postpartum visit. She is 6 weeks postpartum following a low cervical transverse Cesarean section. I have fully reviewed the prenatal and intrapartum course. The delivery was at 35 gestational weeks.  Anesthesia: spinal. Postpartum course has been doing well. Baby's course has been doing well Baby is feeding by bottle - Gerber Gentle. Bleeding staining only. Bowel function is normal. Bladder function is normal. Patient is sexually active. Contraception method is tubal ligation. Postpartum depression screening:neg,score 0.  Victoria Solis reports continued spotting off and on since discharge from the hospital 6 wks ago. Spotting is redish brown with a slight odor, and she has to wear a panty liner. She had three days of increased bleeding, similar to a period, around 10/08/18. She did have a drop in Hgb from 11.0 to 5.0 on 8/22 while in the hospital for delivery; she received blood transfusion and Hgb was 7.8 upon discharge. Today she denies fatigue, weakness, dizziness, lightheadedness, headache, chest pain, palpitations, and SOB. Denies pelvic and vaginal pain. She has since had intercourse without any pain. Denies bleeding or purulent discharge from C-section incision.  She has been able to go back to work without any problems. Denies changes in appetite, lack of motivation, anhedonia, feeling overwhelmed, and difficulties caring for her baby.  The following portions of the patient's history were reviewed and updated as appropriate: allergies, current medications, past family history, past medical history, past social history, past surgical history and problem list. Last pap smear done 11/2017 and was Normal  Review of Systems Pertinent items are noted in HPI.    Objective:  Blood pressure 114/73, pulse (!) 54, weight 158 lb (71.7 kg), not currently breastfeeding.  VS reviewed, nursing note reviewed,  Constitutional:  well developed, well nourished, no distress HEENT: normocephalic CV: Bradycardic, regular rhythm, no murmurs appreciated Pulm/chest wall: normal effort, no respiratory distress, clear to auscultation bilaterally Abdomen: soft Neuro: alert and oriented x 3 Skin: warm, dry Psych: affect normal Pelvic:  Bimanual:        Assessment/Plan:   1. Postpartum examination following cesarean delivery - Pt is doing well and concerned about continued spotting 6 wks postpartum. Denies pain.  - Reports incision continuing to heal well.  - Feeding: Bottle  - Contraception: BTL  2. Anemia of mother in pregnancy, delivered with postpartum condition - Denies symptoms of anemia.  - Will defer checking CBC today since she is completely asymptomatic.   3. Abnormal uterine bleeding (AUB) - Pt is continuing to have some redish brown spotting for which she wears a panty liner. Blood seen on speculum exam. No pain on bimanual exam. Reassured pt that this is most likely normal lochia, which can be normal up to 8 wks postpartum. She denies pelvic and vaginal pain, and she has been able to have intercourse and return to work without difficulties, making endometritis less concerning.  - Advised pt to call if she develops fever or pain, as this would be more concerning for endometritis. Will prescribe antibiotics for endometritis if condition worsens.  - Otherwise, follow-up in one month if she continues to have spotting without pain or fever.     Social/emotional concerns:  none.  Contraception: tubal ligation  Follow up in: 1 month or as needed.   Dimas Millin, Medical Student 4:42 PM

## 2018-10-28 ENCOUNTER — Other Ambulatory Visit (HOSPITAL_COMMUNITY)
Admission: RE | Admit: 2018-10-28 | Discharge: 2018-10-28 | Disposition: A | Discharge status: Home / Self Care | Payer: Medicaid Other | Source: Ambulatory Visit | Attending: Obstetrics & Gynecology | Admitting: Obstetrics & Gynecology

## 2018-10-28 DIAGNOSIS — N939 Abnormal uterine and vaginal bleeding, unspecified: Secondary | ICD-10-CM | POA: Insufficient documentation

## 2018-10-28 DIAGNOSIS — Z8619 Personal history of other infectious and parasitic diseases: Secondary | ICD-10-CM | POA: Insufficient documentation

## 2018-10-29 LAB — CERVICOVAGINAL ANCILLARY ONLY
Bacterial Vaginitis (gardnerella): POSITIVE — AB
Candida Glabrata: NEGATIVE
Candida Vaginitis: NEGATIVE
Chlamydia: NEGATIVE
Molecular Disclaimer: NEGATIVE
Molecular Disclaimer: NEGATIVE
Molecular Disclaimer: NEGATIVE
Molecular Disclaimer: NEGATIVE
Molecular Disclaimer: NORMAL
Molecular Disclaimer: NORMAL
Neisseria Gonorrhea: NEGATIVE
Trichomonas: NEGATIVE

## 2018-10-31 ENCOUNTER — Other Ambulatory Visit: Payer: Medicaid Other

## 2018-11-04 ENCOUNTER — Other Ambulatory Visit: Payer: Self-pay

## 2018-11-05 ENCOUNTER — Other Ambulatory Visit: Payer: Medicaid Other

## 2018-11-07 NOTE — Telephone Encounter (Signed)
Please review for refill.  

## 2018-11-10 ENCOUNTER — Other Ambulatory Visit: Payer: Self-pay | Admitting: *Deleted

## 2018-11-10 DIAGNOSIS — B9689 Other specified bacterial agents as the cause of diseases classified elsewhere: Secondary | ICD-10-CM

## 2018-11-10 DIAGNOSIS — N760 Acute vaginitis: Secondary | ICD-10-CM

## 2018-11-10 MED ORDER — METRONIDAZOLE 500 MG PO TABS: 500.0000 mg | ORAL_TABLET | Freq: Two times a day (BID) | ORAL | 0 refills | Status: DC

## 2018-11-10 NOTE — Progress Notes (Signed)
Pt called to office stating she saw +BV on mychart and would like tx. Flagyl sent to pharmacy per protocol.

## 2018-11-12 ENCOUNTER — Other Ambulatory Visit: Payer: Medicaid Other

## 2018-11-13 ENCOUNTER — Telehealth: Payer: Self-pay | Admitting: Obstetrics

## 2018-11-20 ENCOUNTER — Other Ambulatory Visit: Payer: Self-pay

## 2018-11-20 ENCOUNTER — Ambulatory Visit (HOSPITAL_COMMUNITY)
Admission: EM | Admit: 2018-11-20 | Discharge: 2018-11-20 | Disposition: A | Discharge status: Home / Self Care | Payer: Medicaid Other | Attending: Family Medicine | Admitting: Family Medicine

## 2018-11-20 ENCOUNTER — Encounter (HOSPITAL_COMMUNITY): Payer: Self-pay

## 2018-11-20 DIAGNOSIS — B370 Candidal stomatitis: Secondary | ICD-10-CM

## 2018-11-20 MED ORDER — NYSTATIN-TRIAMCINOLONE 100000-0.1 UNIT/GM-% EX OINT: 1.0000 "application " | TOPICAL_OINTMENT | Freq: Two times a day (BID) | CUTANEOUS | 0 refills | Status: DC

## 2018-11-20 NOTE — Discharge Instructions (Signed)
Stop the metronidazole Use the ointment 2-3 times a day until rash clears You may use cool compress or ice to area to take down swelling and pain Call or return if not improving over the next couple of days

## 2018-11-20 NOTE — ED Triage Notes (Signed)
Patient presents to Urgent Care with complaints of blister on lower lip that is painful since three days ago. Patient reports she has not had cold sores in the past, has tried multiple lip ointments.

## 2018-11-20 NOTE — ED Provider Notes (Signed)
[facility]    CSN: [id number] Arrival date & time: 11/20/18  1736      History   Chief Complaint Chief Complaint  Patient presents with  . Mouth Lesions    HPI Victoria Solis is a 28 y.o. female.   HPI  Patient has a swollen painful lip.  Is becoming progressively worse over the last 2 to 3 days.  She is never had any lip or mouth sores in the past.  She is never had cold sores.  She states that she can hardly talk because her lips are painful.  She is tried a variety of local ointments and nothing is helping. When asked about new medications she states that she is on metronidazole for BV.  This was given to her a week ago.  She still has 4 pills left.  I told her to stop taking this.  She no longer has any vaginal symptoms. No other rash.  No shortness of breath.  No difficulties swallowing  Past Medical History:  Diagnosis Date  . Migraine     Patient Active Problem List   Diagnosis Date Noted  . Hx of chlamydia infection 10/27/2018  . Status post tubal ligation 10/27/2018  . Abnormal uterine bleeding (AUB) 10/27/2018    Past Surgical History:  Procedure Laterality Date  . CESAREAN SECTION    . CESAREAN SECTION N/A 09/19/2018   Procedure: CESAREAN SECTION;  Surgeon: Catalina Antiguaonstant, Peggy, MD;  Location: [facility];  Service: Obstetrics;  Laterality: N/A;  . CESAREAN SECTION WITH BILATERAL TUBAL LIGATION Bilateral 09/19/2018   Procedure: CESAREAN SECTION WITH BILATERAL TUBAL LIGATION;  Surgeon: Catalina Antiguaonstant, Peggy, MD;  Location: [facility];  Service: Obstetrics;  Laterality: Bilateral;    OB History    Gravida  4   Para  3   Term  3   Preterm      AB      Living  3     SAB      TAB      Ectopic      Multiple  0   Live Births  3            Home Medications    Prior to Admission medications   Medication Sig Start Date End Date Taking? Authorizing Provider  ferrous sulfate 325 (65 FE) MG tablet Take 1 tablet (325 mg total) by mouth  every other day. 09/21/18   Venora MaplesEckstat, Matthew M, MD  nystatin-triamcinolone ointment [facility](MYCOLOG) Apply 1 application topically 2 (two) times daily. 11/20/18   Eustace MooreNelson, Gricelda Foland Sue, MD  Prenatal MV-Min-FA-Omega-3 (PRENATAL GUMMIES/DHA & FA) 0.4-32.5 MG CHEW Chew 1 tablet daily by mouth. 12/16/16   Raelyn Moraawson, Rolitta, CNM    Family History Family History  Problem Relation Age of Onset  . Hypertension Mother   . Diabetes Mother   . Stroke Mother   . Kidney failure Mother   . Hypertension Father   . Diabetes Father     Social History Social History   Tobacco Use  . Smoking status: Never Smoker  . Smokeless tobacco: Never Used  Substance Use Topics  . Alcohol use: Yes    Comment: occ  . Drug use: No     Allergies   Amoxicillin, Latex, and Penicillins   Review of Systems Review of Systems  Constitutional: Negative for chills and fever.  HENT: Positive for mouth sores. Negative for ear pain and sore throat.   Eyes: Negative for pain and visual disturbance.  Respiratory: Negative  for cough and shortness of breath.   Cardiovascular: Negative for chest pain and palpitations.  Gastrointestinal: Negative for abdominal pain and vomiting.  Genitourinary: Negative for dysuria and hematuria.  Musculoskeletal: Negative for arthralgias and back pain.  Skin: Negative for color change and rash.  Neurological: Negative for seizures and syncope.  All other systems reviewed and are negative.    Physical Exam Triage Vital Signs ED Triage Vitals  Enc Vitals Group     BP 11/20/18 1809 (!) 117/48     Pulse Rate 11/20/18 1809 66     Resp 11/20/18 1809 16     Temp 11/20/18 1809 98.3 F (36.8 C)     Temp Source 11/20/18 1809 Oral     SpO2 --      Weight --      Height --      Head Circumference --      Peak Flow --      Pain Score 11/20/18 1807 10     Pain Loc --      Pain Edu? --      Excl. in [place]? --    No data found.  Updated Vital Signs BP (!) 117/48 (BP Location: Right Arm)    Pulse 66   Temp 98.3 F (36.8 C) (Oral)   Resp 16       Physical Exam Constitutional:      General: She is not in acute distress.    Appearance: She is well-developed.  HENT:     Head: Normocephalic and atraumatic.     Nose: No rhinorrhea.     Mouth/Throat:     Mouth: Mucous membranes are moist.     Pharynx: Oropharyngeal exudate present. No posterior oropharyngeal erythema.     Comments: The lower lip is diffusely swollen.  There is fissuring across the vermilion border.  On the inside of the lip there is erythematous raw skin with a white plaque present. Eyes:     Conjunctiva/sclera: Conjunctivae normal.     Pupils: Pupils are equal, round, and reactive to light.  Neck:     Musculoskeletal: Normal range of motion.  Cardiovascular:     Rate and Rhythm: Normal rate.  Pulmonary:     Effort: Pulmonary effort is normal. No respiratory distress.  Abdominal:     General: There is no distension.     Palpations: Abdomen is soft.  Musculoskeletal: Normal range of motion.  Skin:    General: Skin is warm and dry.  Neurological:     Mental Status: She is alert.    I had Dr. Brent General look at this lady's lip with me.  It is uncertain whether this is a localized drug eruption, stomatitis type, versus thrush from her antibiotic prescription.  UC Treatments / Results  Labs (all labs ordered are listed, but only abnormal results are displayed) Labs Reviewed - No data to display  EKG   Radiology No results found.  Procedures Procedures (including critical care time)  Medications Ordered in UC Medications - No data to display  Initial Impression / Assessment and Plan / UC Course  I have reviewed the triage vital signs and the nursing notes.  Pertinent labs & imaging results that were available during my care of the patient were reviewed by me and considered in my medical decision making (see chart for details).     It was decided she should stop the metronidazole.  Use a  nystatin triamcinolone ointment.  Use ice to area.  Call or  return if not better in a couple days Final Clinical Impressions(s) / UC Diagnoses   Final diagnoses:  Oral thrush     Discharge Instructions     Stop the metronidazole Use the ointment 2-3 times a day until rash clears You may use cool compress or ice to area to take down swelling and pain Call or return if not improving over the next couple of days   ED Prescriptions    Medication Sig Dispense Auth. Provider   nystatin-triamcinolone ointment (MYCOLOG) Apply 1 application topically 2 (two) times daily. 15 g Eustace Moore, MD     PDMP not reviewed this encounter.   Eustace Moore, MD 11/20/18 [phone]

## 2018-11-26 ENCOUNTER — Ambulatory Visit: Payer: Medicaid Other | Admitting: Obstetrics & Gynecology

## 2018-12-19 ENCOUNTER — Encounter (HOSPITAL_COMMUNITY): Payer: Self-pay

## 2018-12-19 ENCOUNTER — Other Ambulatory Visit: Payer: Self-pay

## 2018-12-19 ENCOUNTER — Ambulatory Visit (HOSPITAL_COMMUNITY)
Admission: EM | Admit: 2018-12-19 | Discharge: 2018-12-19 | Disposition: A | Discharge status: Home / Self Care | Payer: Medicaid Other | Attending: Emergency Medicine | Admitting: Emergency Medicine

## 2018-12-19 DIAGNOSIS — N898 Other specified noninflammatory disorders of vagina: Secondary | ICD-10-CM | POA: Diagnosis present

## 2018-12-19 DIAGNOSIS — Z3202 Encounter for pregnancy test, result negative: Secondary | ICD-10-CM

## 2018-12-19 LAB — POC URINE PREG, ED: Preg Test, Ur: NEGATIVE

## 2018-12-19 LAB — POCT PREGNANCY, URINE: Preg Test, Ur: NEGATIVE

## 2018-12-19 MED ORDER — METRONIDAZOLE 500 MG PO TABS: 500.0000 mg | ORAL_TABLET | Freq: Two times a day (BID) | ORAL | 0 refills | Status: AC

## 2018-12-19 NOTE — Discharge Instructions (Signed)
We will start treatment for bacterial vaginosis as we await your vaginal swab testing.  Will notify of any positive findings and if any changes to treatment are needed.  You may monitor your results on your MyChart online as well.   If symptoms worsen or do not improve in the next week to return to be seen or to follow up with your PCP.

## 2018-12-19 NOTE — ED Triage Notes (Signed)
Pt presents to UC stating she saw dried blood and blood on toilet paper when she wiped 5 days ago. Pt states she's been having increased vaginal discharge since 5 days ago.

## 2018-12-20 NOTE — ED Provider Notes (Signed)
[facility]    CSN: [id number] Arrival date & time: 12/19/18  1609      History   Chief Complaint Chief Complaint  Patient presents with  . vaginal discharge    HPI Victoria Solis is a 28 y.o. female.   Victoria Solis presents with complaints of vaginal discharge which has been brown which she first noted approximately 5 days ago which has persisted. Some odor noted. No pain. No urinary symptoms. She thought she was going to start her period but has not started to experience any further flow. Her LMP was approximately 10/1, she thinks she might be late on her period, although they can be irregular. Sexually active with 1 partner, doesn't use condoms. No specific known std exposure. History  Of other std's. History  Of tubal ligation.    ROS per HPI, negative if not otherwise mentioned.      Past Medical History:  Diagnosis Date  . Migraine     Patient Active Problem List   Diagnosis Date Noted  . Hx of chlamydia infection 10/27/2018  . Status post tubal ligation 10/27/2018  . Abnormal uterine bleeding (AUB) 10/27/2018    Past Surgical History:  Procedure Laterality Date  . CESAREAN SECTION    . CESAREAN SECTION N/A 09/19/2018   Procedure: CESAREAN SECTION;  Surgeon: Mora Bellman, MD;  Location: [facility];  Service: Obstetrics;  Laterality: N/A;  . CESAREAN SECTION WITH BILATERAL TUBAL LIGATION Bilateral 09/19/2018   Procedure: CESAREAN SECTION WITH BILATERAL TUBAL LIGATION;  Surgeon: Mora Bellman, MD;  Location: [facility];  Service: Obstetrics;  Laterality: Bilateral;    OB History    Gravida  4   Para  3   Term  3   Preterm      AB      Living  3     SAB      TAB      Ectopic      Multiple  0   Live Births  3            Home Medications    Prior to Admission medications   Medication Sig Start Date End Date Taking? Authorizing Provider  ferrous sulfate 325 (65 FE) MG tablet Take 1 tablet (325 mg total) by  mouth every other day. 09/21/18   Clarnce Flock, MD  metroNIDAZOLE (FLAGYL) 500 MG tablet Take 1 tablet (500 mg total) by mouth 2 (two) times daily for 7 days. 12/19/18 12/26/18  Zigmund Gottron, NP  nystatin-triamcinolone ointment (MYCOLOG) Apply 1 application topically 2 (two) times daily. 11/20/18   Raylene Everts, MD  Prenatal MV-Min-FA-Omega-3 (PRENATAL GUMMIES/DHA & FA) 0.4-32.5 MG CHEW Chew 1 tablet daily by mouth. 12/16/16   Laury Deep, CNM    Family History Family History  Problem Relation Age of Onset  . Hypertension Mother   . Diabetes Mother   . Stroke Mother   . Kidney failure Mother   . Hypertension Father   . Diabetes Father     Social History Social History   Tobacco Use  . Smoking status: Never Smoker  . Smokeless tobacco: Never Used  Substance Use Topics  . Alcohol use: Yes    Comment: occ  . Drug use: No     Allergies   Amoxicillin, Latex, and Penicillins   Review of Systems Review of Systems   Physical Exam Triage Vital Signs ED Triage Vitals  Enc Vitals Group     BP 12/19/18 1646  122/62     Pulse Rate 12/19/18 1646 (!) 51     Resp 12/19/18 1646 16     Temp 12/19/18 1646 98.2 F (36.8 C)     Temp Source 12/19/18 1646 Oral     SpO2 12/19/18 1646 100 %     Weight --      Height --      Head Circumference --      Peak Flow --      Pain Score 12/19/18 1648 0     Pain Loc --      Pain Edu? --      Excl. in GC? --    No data found.  Updated Vital Signs BP 122/62 (BP Location: Right Arm)   Pulse (!) 51   Temp 98.2 F (36.8 C) (Oral)   Resp 16   SpO2 100%   Visual Acuity Right Eye Distance:   Left Eye Distance:   Bilateral Distance:    Right Eye Near:   Left Eye Near:    Bilateral Near:     Physical Exam Constitutional:      General: She is not in acute distress.    Appearance: She is well-developed.  Cardiovascular:     Rate and Rhythm: Normal rate.  Pulmonary:     Effort: Pulmonary effort is normal.   Abdominal:     Palpations: Abdomen is soft. Abdomen is not rigid.     Tenderness: There is no abdominal tenderness. There is no guarding or rebound.  Genitourinary:    Comments: Denies sores, lesions, vaginal bleeding; no pelvic pain; gu exam deferred at this time, vaginal self swab collected.   Skin:    General: Skin is warm and dry.  Neurological:     Mental Status: She is alert and oriented to person, place, and time.      UC Treatments / Results  Labs (all labs ordered are listed, but only abnormal results are displayed) Labs Reviewed  POC URINE PREG, ED  POCT PREGNANCY, URINE  CERVICOVAGINAL ANCILLARY ONLY    EKG   Radiology No results found.  Procedures Procedures (including critical care time)  Medications Ordered in UC Medications - No data to display  Initial Impression / Assessment and Plan / UC Course  I have reviewed the triage vital signs and the nursing notes.  Pertinent labs & imaging results that were available during my care of the patient were reviewed by me and considered in my medical decision making (see chart for details).    BV treatment initiated pending vaginal cytology. Will notify of any positive findings and if any changes to treatment are needed.  Safe sex encouraged. Return precautions provided. Patient verbalized understanding and agreeable to plan.   Final Clinical Impressions(s) / UC Diagnoses   Final diagnoses:  Vaginal discharge     Discharge Instructions     We will start treatment for bacterial vaginosis as we await your vaginal swab testing.  Will notify of any positive findings and if any changes to treatment are needed.  You may monitor your results on your MyChart online as well.   If symptoms worsen or do not improve in the next week to return to be seen or to follow up with your PCP.      ED Prescriptions    Medication Sig Dispense Auth. Provider   metroNIDAZOLE (FLAGYL) 500 MG tablet Take 1 tablet (500 mg total)  by mouth 2 (two) times daily for 7 days. 14 tablet Linus MakoBurky, Derryck Shahan  B, NP     PDMP not reviewed this encounter.   Georgetta Haber, NP 12/20/18 1135

## 2018-12-23 ENCOUNTER — Telehealth (HOSPITAL_COMMUNITY): Payer: Self-pay | Admitting: Emergency Medicine

## 2018-12-23 LAB — CERVICOVAGINAL ANCILLARY ONLY
Bacterial vaginitis: POSITIVE — AB
Candida vaginitis: POSITIVE — AB
Chlamydia: NEGATIVE
Neisseria Gonorrhea: NEGATIVE
Trichomonas: NEGATIVE

## 2018-12-23 MED ORDER — FLUCONAZOLE 150 MG PO TABS: 150.0000 mg | ORAL_TABLET | Freq: Once | ORAL | 0 refills | Status: AC

## 2018-12-23 MED ORDER — CLINDAMYCIN HCL 300 MG PO CAPS: 300.0000 mg | ORAL_CAPSULE | Freq: Two times a day (BID) | ORAL | 0 refills | Status: AC

## 2018-12-23 NOTE — Telephone Encounter (Signed)
Bacterial Vaginosis test is positive.  Prescription for metronidazole was given at the urgent care visit.  Test for candida (yeast) was positive.  Prescription for fluconazole 150mg  po now, repeat dose in 3d if needed, #2 no refills, sent to the pharmacy of record.  Recheck or followup with PCP for further evaluation if symptoms are not improving.    Patient contacted and made aware of    results. Pt verbalized understanding and had all questions answered.   Pt states she had a reaction the to flagyl and stopped taking it, requesting something different. Okay to send clindamycin per Dr. Meda Coffee

## 2019-03-24 ENCOUNTER — Other Ambulatory Visit: Payer: Self-pay

## 2019-03-24 ENCOUNTER — Ambulatory Visit (HOSPITAL_COMMUNITY)
Admission: EM | Admit: 2019-03-24 | Discharge: 2019-03-24 | Disposition: A | Discharge status: Home / Self Care | Payer: Medicaid Other | Attending: Family Medicine | Admitting: Family Medicine

## 2019-03-24 ENCOUNTER — Encounter (HOSPITAL_COMMUNITY): Payer: Self-pay

## 2019-03-24 DIAGNOSIS — N898 Other specified noninflammatory disorders of vagina: Secondary | ICD-10-CM | POA: Diagnosis not present

## 2019-03-24 MED ORDER — CLINDAMYCIN HCL 150 MG PO CAPS: 300.0000 mg | ORAL_CAPSULE | Freq: Two times a day (BID) | ORAL | 0 refills | Status: AC

## 2019-03-24 NOTE — ED Triage Notes (Addendum)
Pt is here with vaginal discharge and irritation that started Sunday states she just ended her period as well then. Pt has not taken any meds to relieve discomfort. States she has a hx of yeast infections.

## 2019-03-24 NOTE — Discharge Instructions (Addendum)
Treating for BV Sending swab for testing

## 2019-03-24 NOTE — ED Provider Notes (Signed)
[facility]    CSN: [id number] Arrival date & time: 03/24/19  0813      History   Chief Complaint Chief Complaint  Patient presents with  . Vaginal Discharge    HPI Victoria Solis is a 29 y.o. female.    Vaginal Discharge Quality:  Milky and white Severity:  Mild Duration:  3 days Timing:  Constant Context: spontaneously   Relieved by:  Nothing Worsened by:  Nothing Ineffective treatments:  None tried Associated symptoms: no abdominal pain, no dyspareunia, no dysuria, no fever, no genital lesions, no nausea, no rash, no urinary frequency, no urinary hesitancy, no urinary incontinence, no vaginal itching and no vomiting   Risk factors: STI exposure and unprotected sex     Past Medical History:  Diagnosis Date  . Migraine     Patient Active Problem List   Diagnosis Date Noted  . Hx of chlamydia infection 10/27/2018  . Status post tubal ligation 10/27/2018  . Abnormal uterine bleeding (AUB) 10/27/2018    Past Surgical History:  Procedure Laterality Date  . CESAREAN SECTION    . CESAREAN SECTION N/A 09/19/2018   Procedure: CESAREAN SECTION;  Surgeon: Catalina Antigua, MD;  Location: [facility];  Service: Obstetrics;  Laterality: N/A;  . CESAREAN SECTION WITH BILATERAL TUBAL LIGATION Bilateral 09/19/2018   Procedure: CESAREAN SECTION WITH BILATERAL TUBAL LIGATION;  Surgeon: Catalina Antigua, MD;  Location: [facility];  Service: Obstetrics;  Laterality: Bilateral;    OB History    Gravida  4   Para  3   Term  3   Preterm      AB      Living  3     SAB      TAB      Ectopic      Multiple  0   Live Births  3            Home Medications    Prior to Admission medications   Medication Sig Start Date End Date Taking? Authorizing Provider  clindamycin (CLEOCIN) 150 MG capsule Take 2 capsules (300 mg total) by mouth in the morning and at bedtime for 7 days. 03/24/19 03/31/19  Dahlia Byes A, NP  ferrous sulfate 325 (65 FE) MG tablet  Take 1 tablet (325 mg total) by mouth every other day. 09/21/18   Venora Maples, MD  fluconazole (DIFLUCAN) 150 MG tablet Take 150 mg by mouth once. 12/23/18   [provider]  nystatin-triamcinolone ointment (MYCOLOG) Apply 1 application topically 2 (two) times daily. 11/20/18   Eustace Moore, MD  Prenatal MV-Min-FA-Omega-3 (PRENATAL GUMMIES/DHA & FA) 0.4-32.5 MG CHEW Chew 1 tablet daily by mouth. 12/16/16   Raelyn Mora, CNM    Family History Family History  Problem Relation Age of Onset  . Hypertension Mother   . Diabetes Mother   . Stroke Mother   . Kidney failure Mother   . Hypertension Father   . Diabetes Father     Social History Social History   Tobacco Use  . Smoking status: Never Smoker  . Smokeless tobacco: Never Used  Substance Use Topics  . Alcohol use: Yes    Comment: occ  . Drug use: No     Allergies   Amoxicillin, Latex, and Penicillins   Review of Systems Review of Systems  Constitutional: Negative for fever.  Gastrointestinal: Negative for abdominal pain, nausea and vomiting.  Genitourinary: Positive for vaginal discharge. Negative for bladder incontinence, dyspareunia, dysuria and hesitancy.  Physical Exam Triage Vital Signs ED Triage Vitals  Enc Vitals Group     BP 03/24/19 0847 113/77     Pulse Rate 03/24/19 0847 (!) 52     Resp 03/24/19 0847 18     Temp 03/24/19 0847 98.1 F (36.7 C)     Temp Source 03/24/19 0847 Oral     SpO2 03/24/19 0847 98 %     Weight 03/24/19 0850 167 lb 9.6 oz (76 kg)     Height --      Head Circumference --      Peak Flow --      Pain Score 03/24/19 0850 0     Pain Loc --      Pain Edu? --      Excl. in GC? --    No data found.  Updated Vital Signs BP 113/77 (BP Location: Left Arm)   Pulse (!) 52 Comment: states she has a low pulse  Temp 98.1 F (36.7 C) (Oral)   Resp 18   Wt 167 lb 9.6 oz (76 kg)   LMP 03/16/2019   SpO2 98%   Breastfeeding No   BMI 30.65 kg/m   Visual  Acuity Right Eye Distance:   Left Eye Distance:   Bilateral Distance:    Right Eye Near:   Left Eye Near:    Bilateral Near:     Physical Exam Vitals and nursing note reviewed.  Constitutional:      General: She is not in acute distress.    Appearance: Normal appearance. She is not ill-appearing, toxic-appearing or diaphoretic.  HENT:     Head: Normocephalic.     Nose: Nose normal.  Eyes:     Conjunctiva/sclera: Conjunctivae normal.  Pulmonary:     Effort: Pulmonary effort is normal.  Musculoskeletal:        General: Normal range of motion.     Cervical back: Normal range of motion.  Skin:    General: Skin is warm and dry.     Findings: No rash.  Neurological:     Mental Status: She is alert.  Psychiatric:        Mood and Affect: Mood normal.      UC Treatments / Results  Labs (all labs ordered are listed, but only abnormal results are displayed) Labs Reviewed  CERVICOVAGINAL ANCILLARY ONLY    EKG   Radiology No results found.  Procedures Procedures (including critical care time)  Medications Ordered in UC Medications - No data to display  Initial Impression / Assessment and Plan / UC Course  I have reviewed the triage vital signs and the nursing notes.  Pertinent labs & imaging results that were available during my care of the patient were reviewed by me and considered in my medical decision making (see chart for details).     Vaginal discharge- treating for BV based on symptoms and hx.  Treated with clindamycin.  She has had a reaction to metronidazole in the past. Oral swelling and lip peeling.  Swab sent for  testing with labs pending Follow up as needed for continued or worsening symptoms  Final Clinical Impressions(s) / UC Diagnoses   Final diagnoses:  Vaginal discharge     Discharge Instructions     Treating for BV Sending swab for testing    ED Prescriptions    Medication Sig Dispense Auth. Provider   clindamycin (CLEOCIN) 150  MG capsule Take 2 capsules (300 mg total) by mouth in the morning and at bedtime  for 7 days. 28 capsule Jada Kuhnert A, NP     PDMP not reviewed this encounter.   Orvan July, NP 03/24/19 1546

## 2019-03-26 LAB — CERVICOVAGINAL ANCILLARY ONLY
Bacterial vaginitis: POSITIVE — AB
Candida vaginitis: NEGATIVE
Chlamydia: NEGATIVE
Neisseria Gonorrhea: NEGATIVE
Trichomonas: POSITIVE — AB

## 2019-03-27 ENCOUNTER — Telehealth (HOSPITAL_COMMUNITY): Payer: Self-pay | Admitting: Emergency Medicine

## 2019-03-27 MED ORDER — METRONIDAZOLE 500 MG PO TABS: 2000.0000 mg | ORAL_TABLET | Freq: Once | ORAL | 0 refills | Status: AC

## 2019-03-27 NOTE — Telephone Encounter (Signed)
Bacterial Vaginosis test is positive.  Prescription for clindamycin was given at the urgent care visit.  Trichomonas is positive. Rx  for Flagyl 2 grams, once was sent to the pharmacy of record. Pt needs education to refrain from sexual intercourse for 7 days to give the medicine time to work. Sexual partners need to be notified and tested/treated. Condoms may reduce risk of reinfection. Recheck for further evaluation if symptoms are not improving.   Patient contacted by phone and made aware of    results. Pt verbalized understanding and had all questions answered.

## 2019-03-28 ENCOUNTER — Other Ambulatory Visit: Payer: Self-pay

## 2019-03-28 ENCOUNTER — Ambulatory Visit (HOSPITAL_COMMUNITY)
Admission: EM | Admit: 2019-03-28 | Discharge: 2019-03-28 | Disposition: A | Discharge status: Home / Self Care | Payer: Medicaid Other

## 2019-03-28 ENCOUNTER — Encounter (HOSPITAL_COMMUNITY): Payer: Self-pay

## 2019-03-28 DIAGNOSIS — B9689 Other specified bacterial agents as the cause of diseases classified elsewhere: Secondary | ICD-10-CM

## 2019-03-28 DIAGNOSIS — R220 Localized swelling, mass and lump, head: Secondary | ICD-10-CM

## 2019-03-28 DIAGNOSIS — N760 Acute vaginitis: Secondary | ICD-10-CM

## 2019-03-28 DIAGNOSIS — T7840XA Allergy, unspecified, initial encounter: Secondary | ICD-10-CM | POA: Diagnosis not present

## 2019-03-28 DIAGNOSIS — A599 Trichomoniasis, unspecified: Secondary | ICD-10-CM

## 2019-03-28 MED ORDER — HYDROXYZINE HCL 25 MG PO TABS: 12.5000 mg | ORAL_TABLET | Freq: Three times a day (TID) | ORAL | 0 refills | Status: DC | PRN

## 2019-03-28 NOTE — ED Provider Notes (Addendum)
Victoria Solis   MRN: [id number] DOB: November 17, 1990  Subjective:   Victoria Solis is a 29 y.o. female presenting for allergic reaction to metronidazole.  Patient states that he was initially seen on 03/24/2019.  She was started on clindamycin for treatment of BV.  It turns out she also had trichomonas infection.  She was subsequently prescribed metronidazole.  Unfortunately, patient states that she has previously had a reaction to this that included lip swelling that she has today.  She took 1 dose of 500 mg yesterday and as expected her lower lip started swelling has some lip cracking.  Previously she had taken 7 days worth and then had the reaction.  No current facility-administered medications for this encounter.  Current Outpatient Medications:  .  metroNIDAZOLE (FLAGYL) 500 MG tablet, Take 500 mg by mouth 3 (three) times daily., Disp: , Rfl:  .  clindamycin (CLEOCIN) 150 MG capsule, Take 2 capsules (300 mg total) by mouth in the morning and at bedtime for 7 days., Disp: 28 capsule, Rfl: 0 .  ferrous sulfate 325 (65 FE) MG tablet, Take 1 tablet (325 mg total) by mouth every other day., Disp: 30 tablet, Rfl: 1 .  fluconazole (DIFLUCAN) 150 MG tablet, Take 150 mg by mouth once., Disp: , Rfl:  .  nystatin-triamcinolone ointment (MYCOLOG), Apply 1 application topically 2 (two) times daily., Disp: 15 g, Rfl: 0 .  Prenatal MV-Min-FA-Omega-3 (PRENATAL GUMMIES/DHA & FA) 0.4-32.5 MG CHEW, Chew 1 tablet daily by mouth., Disp: 30 tablet, Rfl: 12   Allergies  Allergen Reactions  . Amoxicillin Hives  . Latex Itching  . Penicillins Hives    Has patient had a PCN reaction causing immediate rash, facial/tongue/throat swelling, SOB or lightheadedness with hypotension: Yes Has patient had a PCN reaction causing severe rash involving mucus membranes or skin necrosis: Yes Has patient had a PCN reaction that required hospitalization: No Has patient had a PCN reaction occurring within the last 10  years: No If all of the above answers are "NO", then may proceed with Cephalosporin use.     Past Medical History:  Diagnosis Date  . Migraine      Past Surgical History:  Procedure Laterality Date  . CESAREAN SECTION    . CESAREAN SECTION N/A 09/19/2018   Procedure: CESAREAN SECTION;  Surgeon: Mora Bellman, MD;  Location: [facility];  Service: Obstetrics;  Laterality: N/A;  . CESAREAN SECTION WITH BILATERAL TUBAL LIGATION Bilateral 09/19/2018   Procedure: CESAREAN SECTION WITH BILATERAL TUBAL LIGATION;  Surgeon: Mora Bellman, MD;  Location: [facility];  Service: Obstetrics;  Laterality: Bilateral;    Family History  Problem Relation Age of Onset  . Hypertension Mother   . Diabetes Mother   . Stroke Mother   . Kidney failure Mother   . Hypertension Father   . Diabetes Father     Social History   Tobacco Use  . Smoking status: Never Smoker  . Smokeless tobacco: Never Used  Substance Use Topics  . Alcohol use: Yes    Comment: occ  . Drug use: No    ROS   Objective:   Vitals: BP 114/68 (BP Location: Right Arm)   Pulse 60   Temp 98 F (36.7 C) (Oral)   Resp 16   LMP 03/16/2019   SpO2 100%   Physical Exam Constitutional:      General: She is not in acute distress.    Appearance: Normal appearance. She is well-developed. She is not ill-appearing, toxic-appearing or  diaphoretic.  HENT:     Head: Normocephalic and atraumatic.     Nose: Nose normal.     Mouth/Throat:     Mouth: Mucous membranes are moist.     Comments: 1+ lower lip swelling.  Airway is patent otherwise.  Patient is controlling secretions, no tongue swelling. Eyes:     Extraocular Movements: Extraocular movements intact.     Pupils: Pupils are equal, round, and reactive to light.  Cardiovascular:     Rate and Rhythm: Normal rate and regular rhythm.     Pulses: Normal pulses.     Heart sounds: Normal heart sounds. No murmur. No friction rub. No gallop.   Pulmonary:     Effort: Pulmonary  effort is normal. No respiratory distress.     Breath sounds: Normal breath sounds. No stridor. No wheezing, rhonchi or rales.  Skin:    General: Skin is warm and dry.     Findings: No rash.  Neurological:     Mental Status: She is alert and oriented to person, place, and time.  Psychiatric:        Mood and Affect: Mood normal.        Behavior: Behavior normal.        Thought Content: Thought content normal.        Judgment: Judgment normal.      Assessment and Plan :   1. Allergic reaction, initial encounter   2. Lip swelling   3. Bacterial vaginosis   4. Trichomonas infection     Discussed case with Dr. Tracie Harrier.  Patient appears to be reacting more with this current dose of metronidazole.  Therefore, she is higher risk to continue taking her metronidazole.  We reviewed other treatment options through up-to-date and desensitization is recommended.  Patient does not want to trial any more medication at this point out of understandable fear of worsening allergic reaction.  Recommended that she follow-up with a gynecologist for treatment options ASAP.  Use hydroxyzine for allergic reaction in the meantime. Counseled patient on potential for adverse effects with medications prescribed/recommended today, ER and return-to-clinic precautions discussed, patient verbalized understanding.  I have updated patient's medical record to include metronidazole and her allergies.   Wallis Bamberg, [place] 03/28/19 1138

## 2019-03-28 NOTE — Discharge Instructions (Addendum)
Do not take any more metronidazole. There are not many other options for treatment of trichomoniasis. Unfortunately, we have to refer you to the women's clinic or a gynecologist for help with treatment of this sexually transmitted infection.

## 2019-03-28 NOTE — ED Triage Notes (Signed)
Pt presents to UC with swelling in her lips x 1 day. Pt reports when she takes Metronidazole her lips get swollen and the skin started peeling off. Pt denies any difficulty breathing.

## 2019-07-02 ENCOUNTER — Other Ambulatory Visit: Payer: Self-pay

## 2019-07-02 ENCOUNTER — Other Ambulatory Visit (HOSPITAL_COMMUNITY)
Admission: RE | Admit: 2019-07-02 | Discharge: 2019-07-02 | Disposition: A | Discharge status: Home / Self Care | Payer: Medicaid Other | Source: Ambulatory Visit | Attending: Obstetrics | Admitting: Obstetrics

## 2019-07-02 ENCOUNTER — Ambulatory Visit (INDEPENDENT_AMBULATORY_CARE_PROVIDER_SITE_OTHER): Payer: Medicaid Other | Admitting: Obstetrics

## 2019-07-02 ENCOUNTER — Encounter: Payer: Self-pay | Admitting: Obstetrics

## 2019-07-02 VITALS — BP 121/80 | HR 58 | Ht 63.0 in | Wt 170.0 lb

## 2019-07-02 DIAGNOSIS — Z9851 Tubal ligation status: Secondary | ICD-10-CM | POA: Diagnosis not present

## 2019-07-02 DIAGNOSIS — Z01419 Encounter for gynecological examination (general) (routine) without abnormal findings: Secondary | ICD-10-CM | POA: Diagnosis present

## 2019-07-02 DIAGNOSIS — Z0000 Encounter for general adult medical examination without abnormal findings: Secondary | ICD-10-CM | POA: Diagnosis not present

## 2019-07-02 DIAGNOSIS — N898 Other specified noninflammatory disorders of vagina: Secondary | ICD-10-CM | POA: Insufficient documentation

## 2019-07-02 DIAGNOSIS — Z113 Encounter for screening for infections with a predominantly sexual mode of transmission: Secondary | ICD-10-CM

## 2019-07-02 DIAGNOSIS — Z1272 Encounter for screening for malignant neoplasm of vagina: Secondary | ICD-10-CM

## 2019-07-02 NOTE — Progress Notes (Signed)
Subjective:        Victoria Solis is a 29 y.o. female here for a routine exam.  Current complaints: None.    Personal health questionnaire:  Is patient Ashkenazi Jewish, have a family history of breast and/or ovarian cancer: no Is there a family history of uterine cancer diagnosed at age < 38, gastrointestinal cancer, urinary tract cancer, family member who is a Field seismologist syndrome-associated carrier: no Is the patient overweight and hypertensive, family history of diabetes, personal history of gestational diabetes, preeclampsia or PCOS: no Is patient over 20, have PCOS,  family history of premature CHD under age 47, diabetes, smoke, have hypertension or peripheral artery disease:  no At any time, has a partner hit, kicked or otherwise hurt or frightened you?: no Over the past 2 weeks, have you felt down, depressed or hopeless?: no Over the past 2 weeks, have you felt little interest or pleasure in doing things?:no   Gynecologic History Patient's last menstrual period was 06/27/2019. Contraception: tubal ligation Last Pap: 2019. Results were: normal Last mammogram: n/a. Results were: n/a  Obstetric History OB History  Gravida Para Term Preterm AB Living  4 3 3     3   SAB TAB Ectopic Multiple Live Births        0 3    # Outcome Date GA Lbr Len/2nd Weight Sex Delivery Anes PTL Lv  4 Gravida           3 Term 07/23/17 [redacted]w[redacted]d 04:55 / 00:13 6 lb 14.4 oz (3.13 kg) M VBAC, Vacuum EPI  LIV  2 Term      Vag-Spont   LIV  1 Term      CS-Unspec   LIV    Past Medical History:  Diagnosis Date  . Migraine     Past Surgical History:  Procedure Laterality Date  . CESAREAN SECTION    . CESAREAN SECTION N/A 09/19/2018   Procedure: CESAREAN SECTION;  Surgeon: Mora Bellman, MD;  Location: [facility];  Service: Obstetrics;  Laterality: N/A;  . CESAREAN SECTION WITH BILATERAL TUBAL LIGATION Bilateral 09/19/2018   Procedure: CESAREAN SECTION WITH BILATERAL TUBAL LIGATION;  Surgeon:  Mora Bellman, MD;  Location: [facility];  Service: Obstetrics;  Laterality: Bilateral;     Current Outpatient Medications:  .  hydrOXYzine (ATARAX/VISTARIL) 25 MG tablet, Take 0.5-1 tablets (12.5-25 mg total) by mouth every 8 (eight) hours as needed for itching. (Patient not taking: Reported on 07/02/2019), Disp: 30 tablet, Rfl: 0 .  nystatin-triamcinolone ointment (MYCOLOG), Apply 1 application topically 2 (two) times daily. (Patient not taking: Reported on 07/02/2019), Disp: 15 g, Rfl: 0 Allergies  Allergen Reactions  . Amoxicillin Hives  . Latex Itching  . Metronidazole Swelling  . Penicillins Hives    Has patient had a PCN reaction causing immediate rash, facial/tongue/throat swelling, SOB or lightheadedness with hypotension: Yes Has patient had a PCN reaction causing severe rash involving mucus membranes or skin necrosis: Yes Has patient had a PCN reaction that required hospitalization: No Has patient had a PCN reaction occurring within the last 10 years: No If all of the above answers are "NO", then may proceed with Cephalosporin use.     Social History   Tobacco Use  . Smoking status: Never Smoker  . Smokeless tobacco: Never Used  Substance Use Topics  . Alcohol use: Yes    Comment: occ    Family History  Problem Relation Age of Onset  . Hypertension Mother   . Diabetes  Mother   . Stroke Mother   . Kidney failure Mother   . Hypertension Father   . Diabetes Father       Review of Systems  Constitutional: negative for fatigue and weight loss Respiratory: negative for cough and wheezing Cardiovascular: negative for chest pain, fatigue and palpitations Gastrointestinal: negative for abdominal pain and change in bowel habits Musculoskeletal:negative for myalgias Neurological: negative for gait problems and tremors Behavioral/Psych: negative for abusive relationship, depression Endocrine: negative for temperature intolerance    Genitourinary:negative for abnormal  menstrual periods, genital lesions, hot flashes, sexual problems and vaginal discharge Integument/breast: negative for breast lump, breast tenderness, nipple discharge and skin lesion(s)    Objective:       BP 121/80   Pulse (!) 58   Ht 5\' 3"  (1.6 m)   Wt 170 lb (77.1 kg)   LMP 06/27/2019   BMI 30.11 kg/m  General:   alert  Skin:   no rash or abnormalities  Lungs:   clear to auscultation bilaterally  Heart:   regular rate and rhythm, S1, S2 normal, no murmur, click, rub or gallop  Breasts:   normal without suspicious masses, skin or nipple changes or axillary nodes  Abdomen:  normal findings: no organomegaly, soft, non-tender and no hernia  Pelvis:  External genitalia: normal general appearance Urinary system: urethral meatus normal and bladder without fullness, nontender Vaginal: normal without tenderness, induration or masses Cervix: normal appearance Adnexa: normal bimanual exam Uterus: anteverted and non-tender, normal size   Lab Review Urine pregnancy test Labs reviewed yes Radiologic studies reviewed no  50% of 20 min visit spent on counseling and coordination of care.   Assessment:     1. Encounter for routine gynecological examination with Papanicolaou smear of cervix Rx: - Cytology - PAP( [facility])  2. Vaginal discharge Rx: - Cervicovaginal ancillary only( [facility])  3. Screening for STD (sexually transmitted disease) Rx: - Hepatitis B surface antigen - HIV Antibody (routine testing w rflx) - RPR - Hepatitis C antibody  4. Status post tubal ligation    Plan:    Education reviewed: calcium supplements, depression evaluation, low fat, low cholesterol diet, safe sex/STD prevention, self breast exams and weight bearing exercise. Follow up in: 1 year.    Orders Placed This Encounter  Procedures  . Hepatitis B surface antigen  . HIV Antibody (routine testing w rflx)  . RPR  . Hepatitis C antibody    06/29/2019, MD 07/02/2019 11:58  AM

## 2019-07-03 LAB — CERVICOVAGINAL ANCILLARY ONLY
Bacterial Vaginitis (gardnerella): POSITIVE — AB
Candida Glabrata: NEGATIVE
Candida Vaginitis: NEGATIVE
Chlamydia: NEGATIVE
Comment: NEGATIVE
Comment: NEGATIVE
Comment: NEGATIVE
Comment: NEGATIVE
Comment: NEGATIVE
Comment: NORMAL
Neisseria Gonorrhea: NEGATIVE
Trichomonas: POSITIVE — AB

## 2019-07-03 LAB — HEPATITIS C ANTIBODY: Hep C Virus Ab: <0.1 s/co ratio (ref 0.0–0.9)

## 2019-07-03 LAB — CYTOLOGY - PAP
Comment: NEGATIVE
Diagnosis: NEGATIVE
High risk HPV: NEGATIVE

## 2019-07-03 LAB — HIV ANTIBODY (ROUTINE TESTING W REFLEX): HIV Screen 4th Generation wRfx: NONREACTIVE

## 2019-07-03 LAB — RPR: RPR Ser Ql: NONREACTIVE

## 2019-07-03 LAB — HEPATITIS B SURFACE ANTIGEN: Hepatitis B Surface Ag: NEGATIVE

## 2019-07-10 ENCOUNTER — Encounter: Payer: Self-pay | Admitting: *Deleted

## 2019-07-10 ENCOUNTER — Other Ambulatory Visit: Payer: Self-pay | Admitting: *Deleted

## 2019-07-10 NOTE — Progress Notes (Signed)
Pt called to office for results and if any Rx needed.   Attempt to contact pt. No answer, LVM to call office.

## 2019-07-15 ENCOUNTER — Telehealth: Payer: Self-pay

## 2019-07-15 ENCOUNTER — Other Ambulatory Visit: Payer: Self-pay

## 2019-07-15 NOTE — Telephone Encounter (Signed)
Pt called because she saw her results on mychart for positive Trich and BV. Tried to send patient medication, but patient allergy contraindication pops up for Flagyl and Tindamax. Please advise.

## 2019-07-15 NOTE — Progress Notes (Signed)
Error

## 2019-07-16 ENCOUNTER — Other Ambulatory Visit: Payer: Self-pay

## 2019-07-17 ENCOUNTER — Telehealth: Payer: Self-pay

## 2019-07-17 ENCOUNTER — Telehealth: Payer: Self-pay | Admitting: Advanced Practice Midwife

## 2019-07-17 ENCOUNTER — Other Ambulatory Visit: Payer: Self-pay

## 2019-07-17 DIAGNOSIS — A599 Trichomoniasis, unspecified: Secondary | ICD-10-CM

## 2019-07-17 NOTE — Telephone Encounter (Signed)
Notified by clinic staff today that patient has history of positive Trich on pap collected by Dr. Clearance Coots at beginning of June and has not been prescribed treatment. Authentic Metronidazole allergy. Concern for cross-sensitivity to Tinidazole. I spoke with hospital pharmacist Duane who is going to investigate further and return my phone call.  I called patient at [phone], explained I was actively working on this issue and expect a call back from [place] today. I will send her a MyChart message with his recommendation and next steps as necessary. Patient expressed appreciation and verbalized understanding.  Clayton Bibles, MSN, CNM Certified Nurse Midwife, [facility] for [employer], [facility] 07/17/19 8:49 AM

## 2019-07-17 NOTE — Telephone Encounter (Signed)
TC to pt to make aware a referral has been placed for infection disease for treatment of Trich due to drug allergy of treatment Rx. Pt not ava left detailed message for pt to contact the office.

## 2019-07-21 ENCOUNTER — Ambulatory Visit (INDEPENDENT_AMBULATORY_CARE_PROVIDER_SITE_OTHER): Payer: Medicaid Other | Admitting: Infectious Diseases

## 2019-07-21 ENCOUNTER — Encounter: Payer: Self-pay | Admitting: Infectious Diseases

## 2019-07-21 ENCOUNTER — Other Ambulatory Visit: Payer: Self-pay

## 2019-07-21 DIAGNOSIS — A5901 Trichomonal vulvovaginitis: Secondary | ICD-10-CM | POA: Diagnosis present

## 2019-07-21 MED ORDER — DIPHENHYDRAMINE HCL 50 MG PO TABS: 50.0000 mg | ORAL_TABLET | Freq: Once | ORAL | 0 refills | Status: DC

## 2019-07-21 MED ORDER — TINIDAZOLE 500 MG PO TABS: 2.0000 g | ORAL_TABLET | Freq: Once | ORAL | 0 refills | Status: AC

## 2019-07-21 NOTE — Assessment & Plan Note (Signed)
Discussed with pharmacy Will give her tinadazole 2g x1.  This will treat her BV and trich.  Is a preferred therapy for R-trich Will give her benadryl 50mg  po prior.  Alternatively, could try nitazoxanide.

## 2019-07-21 NOTE — Progress Notes (Signed)
   Subjective:    Patient ID: Victoria Solis, female    DOB: 1990/04/11, 29 y.o.   MRN: [id number]  HPI 29 yo F with hx of trichomonas. She has been treated with metronidazole however this makes her lower lip swell, itch, and turn dark.  Was last seen in GYN 07-02-19- she was positive for Trichomonas as well as BV.  Has had tubal ligation.   The past medical history, family history and social history were reviewed/updated in EPIC   Review of Systems  Constitutional: Negative for appetite change, chills, fever and unexpected weight change.  Gastrointestinal: Negative for abdominal pain, constipation and diarrhea.  Genitourinary: Positive for menstrual problem (irregular). Negative for difficulty urinating, dyspareunia, dysuria and flank pain.  Psychiatric/Behavioral: Negative for sleep disturbance.   No vaginal itching, burning, or odor. Had d/c previously (when tested) but now resolved.    Objective:   Physical Exam Vitals reviewed.  Constitutional:      Appearance: Normal appearance.  HENT:     Nose: Nose normal.     Mouth/Throat:     Mouth: Mucous membranes are dry.  Eyes:     Extraocular Movements: Extraocular movements intact.     Pupils: Pupils are equal, round, and reactive to light.  Cardiovascular:     Rate and Rhythm: Normal rate and regular rhythm.  Pulmonary:     Effort: Pulmonary effort is normal.     Breath sounds: Normal breath sounds.  Abdominal:     General: Bowel sounds are normal. There is no distension.     Palpations: Abdomen is soft.     Tenderness: There is no abdominal tenderness.  Musculoskeletal:        General: Normal range of motion.     Cervical back: Normal range of motion and neck supple.     Right lower leg: No edema.     Left lower leg: No edema.  Skin:    General: Skin is warm.  Neurological:     General: No focal deficit present.     Mental Status: She is alert.  Psychiatric:        Mood and Affect: Mood normal.            Assessment & Plan:

## 2019-07-23 ENCOUNTER — Telehealth: Payer: Self-pay | Admitting: Obstetrics

## 2019-07-23 ENCOUNTER — Other Ambulatory Visit: Payer: Self-pay | Admitting: Obstetrics

## 2019-07-23 DIAGNOSIS — A599 Trichomoniasis, unspecified: Secondary | ICD-10-CM

## 2019-07-23 DIAGNOSIS — N760 Acute vaginitis: Secondary | ICD-10-CM

## 2019-07-23 NOTE — Telephone Encounter (Signed)
Patient seen [facility] for treatment of BV / Trichomonas with an allergy to Flagyl and Tindamax.  She was given Benadryl 20 minutes prior to taking 2 grams of Tindamax, and she tolerated the dosage well without side effect.  Brock Bad, MD 07/23/2019 11:03 AM

## 2019-07-28 ENCOUNTER — Telehealth: Payer: Self-pay | Admitting: Obstetrics

## 2019-07-28 NOTE — Telephone Encounter (Signed)
Called patient to discuss Trichomonas management for recurrent infections.  She was recently treated by ID with a pre-dose of Benadryl 50 mg because of an allergy to Flagyl and this was successful.  I informed her that this will need to be done in the future for any infections requiring Flagyl.  Brock Bad, MD 07/28/2019 4:56 PM

## 2019-07-28 NOTE — Telephone Encounter (Signed)
-----   Message from Kennon Portela, [place] sent at 07/28/2019  4:36 PM EDT ----- Regarding: Rx Not sent TC from pt regarding speaking with you last week about Rx management for STD  Pt states she has been to pharmacy everyday and Rx is not there.    Please advise.

## 2019-08-18 ENCOUNTER — Emergency Department (HOSPITAL_COMMUNITY)
Admission: EM | Admit: 2019-08-18 | Discharge: 2019-08-18 | Disposition: A | Discharge status: Home / Self Care | Payer: Medicaid Other | Attending: Emergency Medicine | Admitting: Emergency Medicine

## 2019-08-18 ENCOUNTER — Other Ambulatory Visit: Payer: Self-pay

## 2019-08-18 DIAGNOSIS — Y92410 Unspecified street and highway as the place of occurrence of the external cause: Secondary | ICD-10-CM | POA: Insufficient documentation

## 2019-08-18 DIAGNOSIS — Z041 Encounter for examination and observation following transport accident: Secondary | ICD-10-CM | POA: Diagnosis present

## 2019-08-18 DIAGNOSIS — R519 Headache, unspecified: Secondary | ICD-10-CM | POA: Insufficient documentation

## 2019-08-18 DIAGNOSIS — Y9389 Activity, other specified: Secondary | ICD-10-CM | POA: Diagnosis not present

## 2019-08-18 DIAGNOSIS — M542 Cervicalgia: Secondary | ICD-10-CM | POA: Insufficient documentation

## 2019-08-18 DIAGNOSIS — Y999 Unspecified external cause status: Secondary | ICD-10-CM | POA: Insufficient documentation
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
## 2019-09-21 IMAGING — US US OB TRANSVAGINAL
1 series · 15 of 28 positions shown · non-contrast
Comparison: None.

CLINICAL DATA: Initial evaluation for acute vaginal bleeding. Early
pregnancy.

EXAM:
OBSTETRIC <14 WK US AND TRANSVAGINAL OB US
TECHNIQUE: Both transabdominal and transvaginal ultrasound examinations were
performed for complete evaluation of the gestation as well as the
maternal uterus, adnexal regions, and pelvic cul-de-sac.
Transvaginal technique was performed to assess early pregnancy.

[Series 1: us ob transvaginal · 15 of 35 slices shown]
[im 1/35]
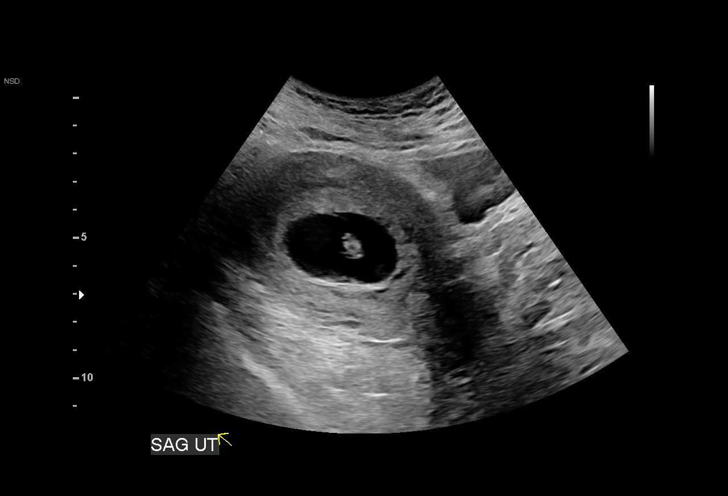
[im 3/35]
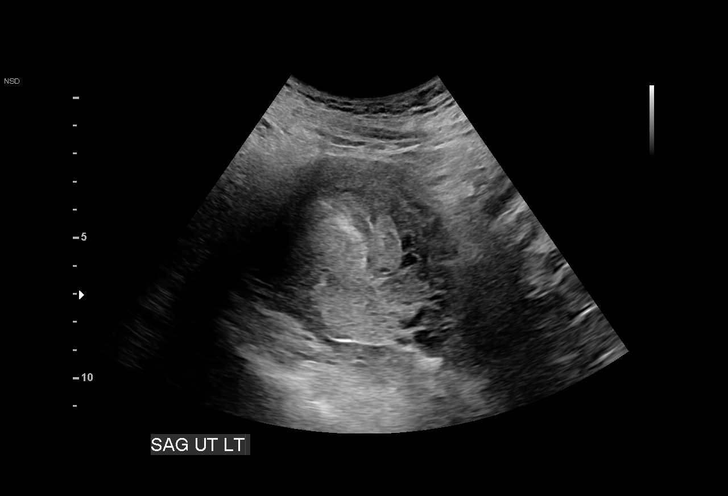
[im 6/35]
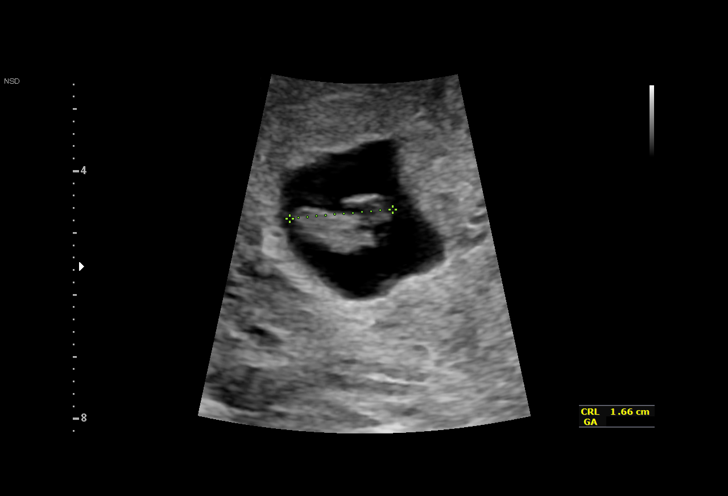
[im 8/35]
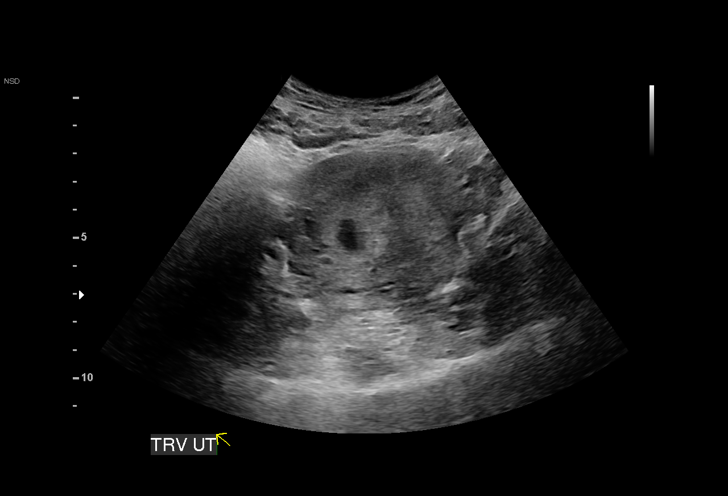
[im 11/35]
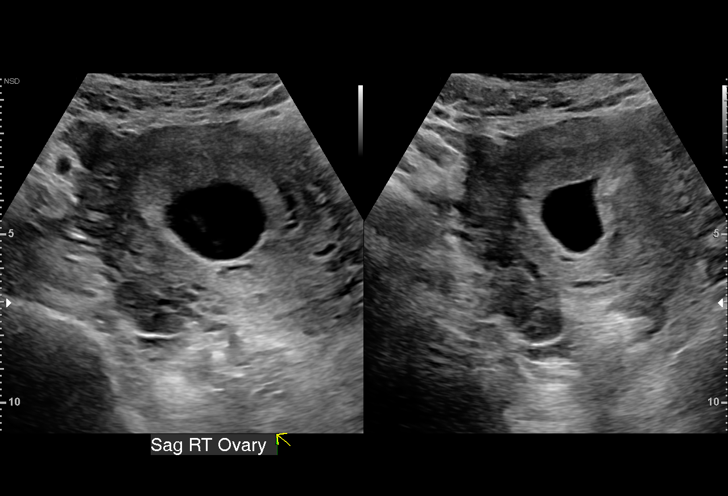
[im 13/35]
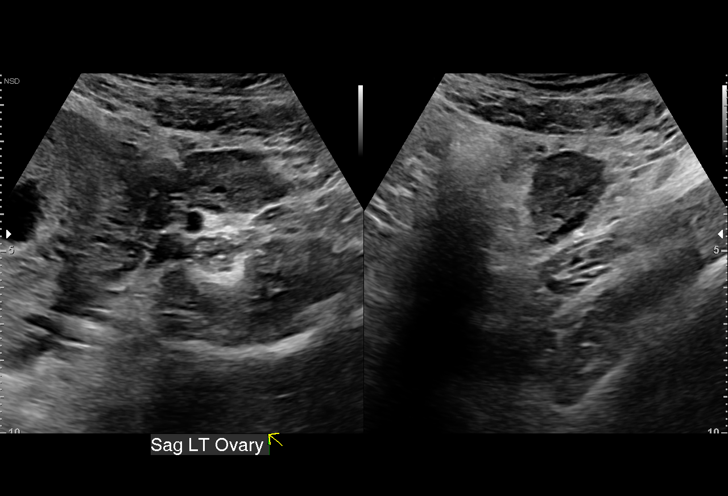
[im 16/35]
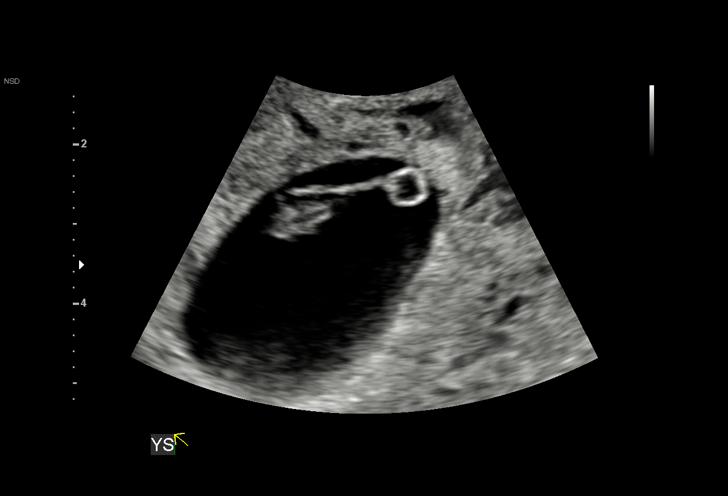
[im 18/35]
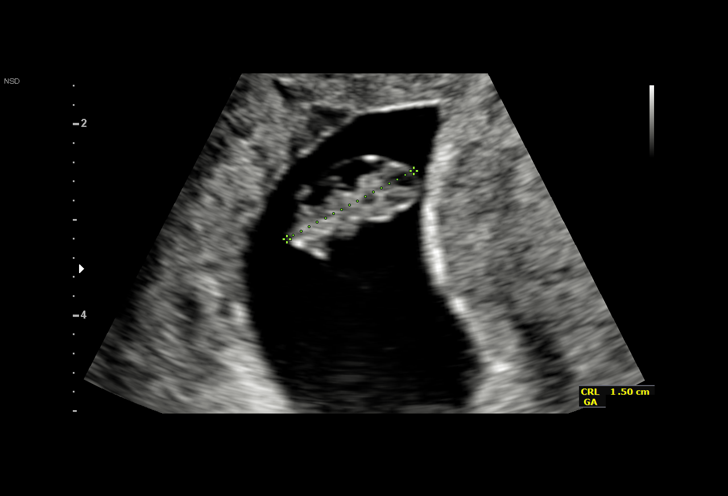
[im 19/35]
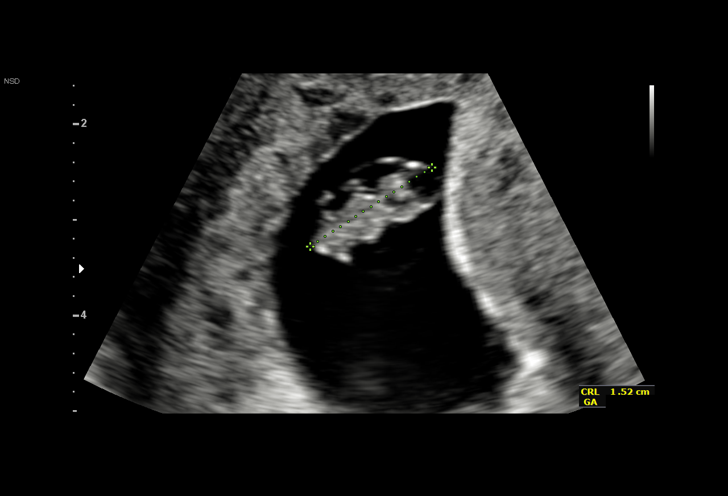
[im 22/35]
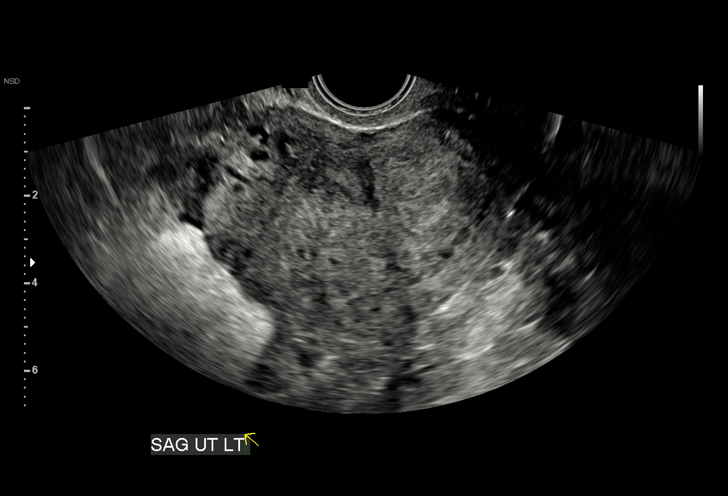
[im 24/35]
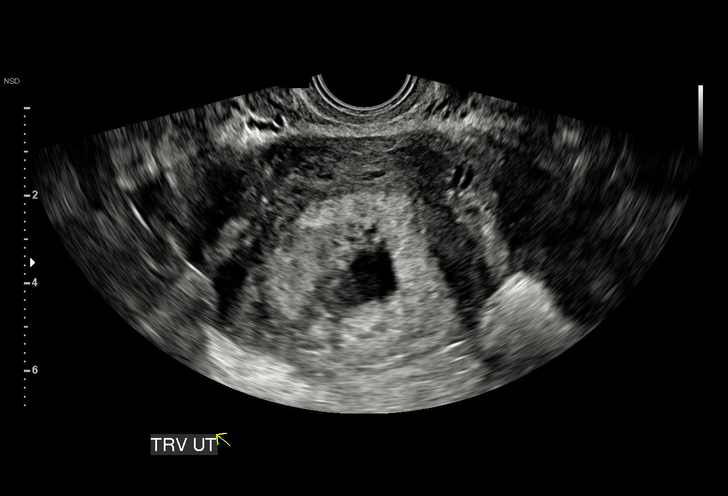
[im 27/35]
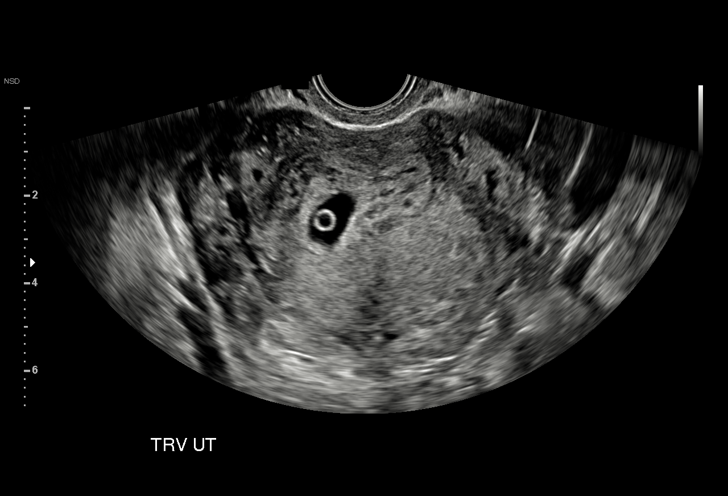
[im 29/35]
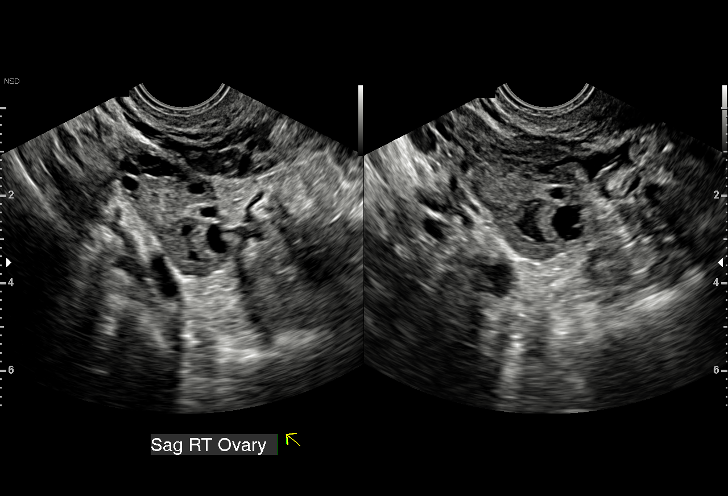
[im 32/35]
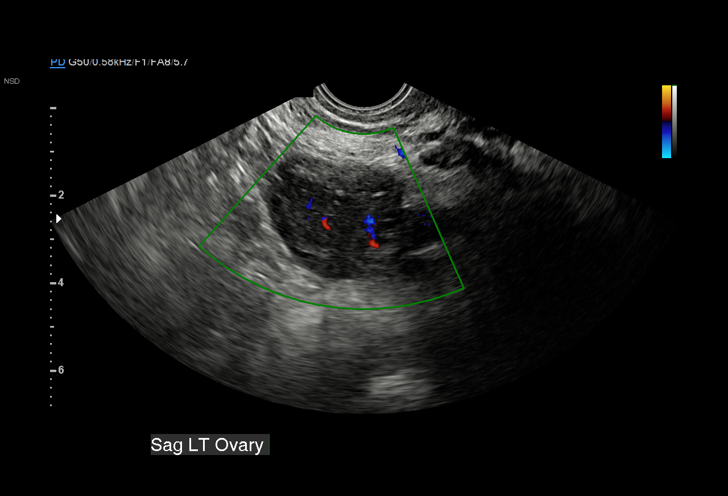
[im 35/35]
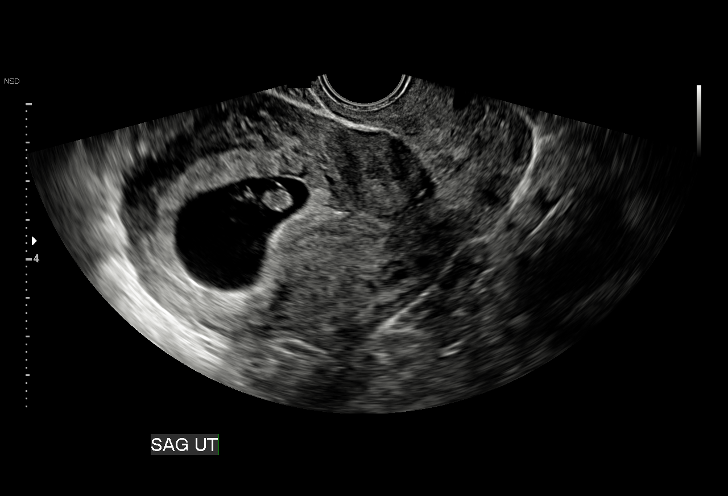

[15 of 28 positions shown; findings below may reference images not displayed]

FINDINGS: Intrauterine gestational sac: Single

Yolk sac:  Present

Embryo:  Present

Cardiac Activity: Present

Heart Rate: 150  bpm

CRL:  15.2  mm   7 w   6 d                  US EDC: 07/29/2017

Subchorionic hemorrhage:  None visualized.

Maternal uterus/adnexae: Ovaries visualized bilaterally and are
normal in appearance. No adnexal mass. No free pelvic fluid.
IMPRESSION: 1. Single live intrauterine gestation, estimated gestational age 7
weeks and 6 days by crown-rump length. No complication.
2. No other acute maternal uterine or adnexal abnormality.

## 2019-10-01 ENCOUNTER — Other Ambulatory Visit (HOSPITAL_COMMUNITY)
Admission: RE | Admit: 2019-10-01 | Discharge: 2019-10-01 | Disposition: A | Discharge status: Home / Self Care | Payer: Medicaid Other | Source: Ambulatory Visit | Attending: Obstetrics & Gynecology | Admitting: Obstetrics & Gynecology

## 2019-10-01 ENCOUNTER — Ambulatory Visit: Payer: Medicaid Other

## 2019-10-01 ENCOUNTER — Other Ambulatory Visit: Payer: Self-pay

## 2019-10-01 DIAGNOSIS — N898 Other specified noninflammatory disorders of vagina: Secondary | ICD-10-CM | POA: Insufficient documentation

## 2019-10-01 NOTE — Progress Notes (Signed)
Pt is in the office for self swab, pt reports brown vaginal discharge.

## 2019-10-06 LAB — CERVICOVAGINAL ANCILLARY ONLY
Bacterial Vaginitis (gardnerella): POSITIVE — AB
Candida Glabrata: NEGATIVE
Candida Vaginitis: NEGATIVE
Chlamydia: NEGATIVE
Comment: NEGATIVE
Comment: NEGATIVE
Comment: NEGATIVE
Comment: NEGATIVE
Comment: NEGATIVE
Comment: NORMAL
Neisseria Gonorrhea: NEGATIVE
Trichomonas: NEGATIVE

## 2019-10-08 ENCOUNTER — Other Ambulatory Visit: Payer: Self-pay | Admitting: Obstetrics & Gynecology

## 2019-10-08 ENCOUNTER — Other Ambulatory Visit: Payer: Self-pay

## 2019-10-08 ENCOUNTER — Telehealth: Payer: Self-pay

## 2019-10-08 DIAGNOSIS — B9689 Other specified bacterial agents as the cause of diseases classified elsewhere: Secondary | ICD-10-CM

## 2019-10-08 MED ORDER — CLINDAMYCIN PHOSPHATE 2 % VA CREA: 1.0000 | TOPICAL_CREAM | Freq: Every day | VAGINAL | Status: DC

## 2019-10-08 MED ORDER — CLINDAMYCIN HCL 300 MG PO CAPS: 300.0000 mg | ORAL_CAPSULE | Freq: Two times a day (BID) | ORAL | 0 refills | Status: DC

## 2019-10-08 NOTE — Telephone Encounter (Signed)
Return call to pt regarding vm on results. Consulted with Dr.harper in office on BV treatment since pt has allergy to Flagyl  Clindamycin 300mg  sent Pt made aware pharmacy received Rx Pt advised to contact office should she have any issues getting Rx.

## 2019-10-19 ENCOUNTER — Other Ambulatory Visit: Payer: Self-pay

## 2019-10-19 DIAGNOSIS — R519 Headache, unspecified: Secondary | ICD-10-CM | POA: Insufficient documentation

## 2019-10-19 DIAGNOSIS — Z5321 Procedure and treatment not carried out due to patient leaving prior to being seen by health care provider: Secondary | ICD-10-CM | POA: Diagnosis not present

## 2019-10-20 ENCOUNTER — Emergency Department (HOSPITAL_COMMUNITY)
Admission: EM | Admit: 2019-10-20 | Discharge: 2019-10-20 | Disposition: A | Discharge status: Home / Self Care | Payer: Medicaid Other | Attending: Emergency Medicine | Admitting: Emergency Medicine

## 2019-10-20 ENCOUNTER — Encounter (HOSPITAL_COMMUNITY): Payer: Self-pay | Admitting: Emergency Medicine

## 2019-10-20 ENCOUNTER — Other Ambulatory Visit: Payer: Self-pay

## 2019-10-20 MED ORDER — IBUPROFEN 800 MG PO TABS
800.0000 mg | ORAL_TABLET | Freq: Once | ORAL | Status: AC
  Administered 2019-10-20: 800 mg via ORAL
  Filled 2019-10-20: qty 2

## 2019-10-20 NOTE — ED Triage Notes (Signed)
Patient with headache for two days.  No nausea or vomiting.  Has not tried any OTC meds.

## 2019-10-20 NOTE — ED Notes (Signed)
Pt called x 3  No answer. 

## 2020-03-08 ENCOUNTER — Other Ambulatory Visit (HOSPITAL_COMMUNITY)
Admission: RE | Admit: 2020-03-08 | Discharge: 2020-03-08 | Disposition: A | Discharge status: Home / Self Care | Payer: Medicaid Other | Source: Ambulatory Visit | Attending: Obstetrics | Admitting: Obstetrics

## 2020-03-08 ENCOUNTER — Ambulatory Visit (INDEPENDENT_AMBULATORY_CARE_PROVIDER_SITE_OTHER): Payer: Medicaid Other | Admitting: *Deleted

## 2020-03-08 ENCOUNTER — Other Ambulatory Visit: Payer: Self-pay

## 2020-03-08 DIAGNOSIS — N898 Other specified noninflammatory disorders of vagina: Secondary | ICD-10-CM

## 2020-03-08 DIAGNOSIS — Z113 Encounter for screening for infections with a predominantly sexual mode of transmission: Secondary | ICD-10-CM | POA: Insufficient documentation

## 2020-03-08 NOTE — Progress Notes (Signed)
Pt states that she is having some vaginal itching x 3 days, ?yeast. Pt would like self swab today to determine needed treatment.  Pt also request STD screen as well. Self Swab collected and full panel ordered.  Pt made aware will await results for any treatment needs.    Pt has no other concerns.

## 2020-03-08 NOTE — Progress Notes (Signed)
Patient was assessed and managed by nursing staff during this encounter. I have reviewed the chart and agree with the documentation and plan. I have also made any necessary editorial changes.  Warden Fillers, MD 03/08/2020 4:42 PM

## 2020-03-09 LAB — CERVICOVAGINAL ANCILLARY ONLY
Bacterial Vaginitis (gardnerella): POSITIVE — AB
Candida Glabrata: NEGATIVE
Candida Vaginitis: POSITIVE — AB
Chlamydia: NEGATIVE
Comment: NEGATIVE
Comment: NEGATIVE
Comment: NEGATIVE
Comment: NEGATIVE
Comment: NEGATIVE
Comment: NORMAL
Neisseria Gonorrhea: NEGATIVE
Trichomonas: NEGATIVE

## 2020-03-10 ENCOUNTER — Other Ambulatory Visit: Payer: Self-pay

## 2020-03-10 MED ORDER — TINIDAZOLE 500 MG PO TABS: ORAL_TABLET | ORAL | 0 refills | Status: DC

## 2020-03-10 MED ORDER — FLUCONAZOLE 150 MG PO TABS: 150.0000 mg | ORAL_TABLET | Freq: Once | ORAL | 0 refills | Status: AC

## 2020-03-10 NOTE — Telephone Encounter (Signed)
Patient has been informed of test results BV and yeast. She is allergic to flagyl so I will send in Tindamax.

## 2020-04-18 ENCOUNTER — Ambulatory Visit: Payer: Medicaid Other

## 2020-04-20 ENCOUNTER — Ambulatory Visit: Payer: Medicaid Other

## 2020-08-23 ENCOUNTER — Encounter (HOSPITAL_COMMUNITY): Payer: Self-pay | Admitting: Emergency Medicine

## 2020-08-23 ENCOUNTER — Other Ambulatory Visit: Payer: Self-pay

## 2020-08-23 ENCOUNTER — Ambulatory Visit (HOSPITAL_COMMUNITY)
Admission: EM | Admit: 2020-08-23 | Discharge: 2020-08-23 | Disposition: A | Discharge status: Home / Self Care | Payer: Medicaid Other | Attending: Family Medicine | Admitting: Family Medicine

## 2020-08-23 DIAGNOSIS — H60501 Unspecified acute noninfective otitis externa, right ear: Secondary | ICD-10-CM

## 2020-08-23 MED ORDER — IBUPROFEN 800 MG PO TABS: 800.0000 mg | ORAL_TABLET | Freq: Three times a day (TID) | ORAL | 0 refills | Status: DC

## 2020-08-23 MED ORDER — SULFAMETHOXAZOLE-TRIMETHOPRIM 800-160 MG PO TABS: 1.0000 | ORAL_TABLET | Freq: Two times a day (BID) | ORAL | 0 refills | Status: AC

## 2020-08-23 NOTE — ED Provider Notes (Signed)
[facility]    CSN: [id number] Arrival date & time: 08/23/20  1696      History   Chief Complaint Chief Complaint  Patient presents with   Otalgia    Right    HPI Victoria Solis is a 30 y.o. female.   Presenting today with 1 day history of right external ear pain, swelling.  States the pain is worse in the tragus area and the area has been swollen.  Denies recent swimming, new soaps or hygiene products, fevers, chills, headaches, muffled hearing or drainage from the area.  So far not trying thing over-the-counter for symptoms.  States pain is severe and she is unable to sleep at this time.   Past Medical History:  Diagnosis Date   Migraine     Patient Active Problem List   Diagnosis Date Noted   Trichomonal vaginitis 07/21/2019   Hx of chlamydia infection 10/27/2018   Status post tubal ligation 10/27/2018   Abnormal uterine bleeding (AUB) 10/27/2018    Past Surgical History:  Procedure Laterality Date   CESAREAN SECTION     CESAREAN SECTION N/A 09/19/2018   Procedure: CESAREAN SECTION;  Surgeon: Catalina Antigua, MD;  Location: [facility];  Service: Obstetrics;  Laterality: N/A;   CESAREAN SECTION WITH BILATERAL TUBAL LIGATION Bilateral 09/19/2018   Procedure: CESAREAN SECTION WITH BILATERAL TUBAL LIGATION;  Surgeon: Catalina Antigua, MD;  Location: [facility];  Service: Obstetrics;  Laterality: Bilateral;    OB History     Gravida  4   Para  3   Term  3   Preterm      AB      Living  3      SAB      IAB      Ectopic      Multiple  0   Live Births  3            Home Medications    Prior to Admission medications   Medication Sig Start Date End Date Taking? Authorizing Provider  ibuprofen (ADVIL) 800 MG tablet Take 1 tablet (800 mg total) by mouth 3 (three) times daily. 08/23/20  Yes Particia Nearing, PA-C  sulfamethoxazole-trimethoprim (BACTRIM DS) 800-160 MG tablet Take 1 tablet by mouth 2 (two) times daily for 7 days.  08/23/20 08/30/20 Yes Particia Nearing, PA-C  diphenhydrAMINE (BENADRYL) 50 MG tablet Take 1 tablet (50 mg total) by mouth once for 1 dose. 07/21/19 07/21/19  Ginnie Smart, MD  hydrOXYzine (ATARAX/VISTARIL) 25 MG tablet Take 0.5-1 tablets (12.5-25 mg total) by mouth every 8 (eight) hours as needed for itching. Patient not taking: No sig reported 03/28/19   Wallis Bamberg, PA-C  nystatin-triamcinolone ointment [facility]) Apply 1 application topically 2 (two) times daily. Patient not taking: No sig reported 11/20/18   Eustace Moore, MD    Family History Family History  Problem Relation Age of Onset   Hypertension Mother    Diabetes Mother    Stroke Mother    Kidney failure Mother    Hypertension Father    Diabetes Father     Social History Social History   Tobacco Use   Smoking status: Never   Smokeless tobacco: Never  Vaping Use   Vaping Use: Never used  Substance Use Topics   Alcohol use: Yes    Comment: occ   Drug use: No     Allergies   Amoxicillin, Latex, Metronidazole, and Penicillins   Review of Systems Review of  Systems Per HPI  Physical Exam Triage Vital Signs ED Triage Vitals  Enc Vitals Group     BP 08/23/20 1111 125/84     Pulse Rate 08/23/20 1111 (!) 50     Resp 08/23/20 1111 16     Temp 08/23/20 1111 98.9 F (37.2 C)     Temp Source 08/23/20 1111 Oral     SpO2 08/23/20 1111 98 %     Weight --      Height --      Head Circumference --      Peak Flow --      Pain Score 08/23/20 1110 10     Pain Loc --      Pain Edu? --      Excl. in GC? --    No data found.  Updated Vital Signs BP 125/84 (BP Location: Right Arm)   Pulse (!) 50   Temp 98.9 F (37.2 C) (Oral)   Resp 16   LMP 08/21/2020   SpO2 98%   Visual Acuity Right Eye Distance:   Left Eye Distance:   Bilateral Distance:    Right Eye Near:   Left Eye Near:    Bilateral Near:     Physical Exam Vitals and nursing note reviewed.  Constitutional:      Appearance:  Normal appearance. She is not ill-appearing.  HENT:     Head: Atraumatic.     Right Ear: Tympanic membrane normal.     Left Ear: Tympanic membrane and external ear normal.     Ears:     Comments: Tragus of the right external ear edematous, significantly tender to palpation.  EAC erythematous, scaly and significantly tender to palpation with otoscope    Nose: Nose normal.     Mouth/Throat:     Mouth: Mucous membranes are moist.     Pharynx: Oropharynx is clear.  Eyes:     Extraocular Movements: Extraocular movements intact.     Conjunctiva/sclera: Conjunctivae normal.  Cardiovascular:     Rate and Rhythm: Normal rate and regular rhythm.     Heart sounds: Normal heart sounds.  Pulmonary:     Effort: Pulmonary effort is normal.     Breath sounds: Normal breath sounds.  Musculoskeletal:        General: Normal range of motion.     Cervical back: Normal range of motion and neck supple.  Skin:    General: Skin is warm and dry.  Neurological:     Mental Status: She is alert and oriented to person, place, and time.  Psychiatric:        Mood and Affect: Mood normal.        Thought Content: Thought content normal.        Judgment: Judgment normal.   UC Treatments / Results  Labs (all labs ordered are listed, but only abnormal results are displayed) Labs Reviewed - No data to display  EKG   Radiology No results found.  Procedures Procedures (including critical care time)  Medications Ordered in UC Medications - No data to display  Initial Impression / Assessment and Plan / UC Course  I have reviewed the triage vital signs and the nursing notes.  Pertinent labs & imaging results that were available during my care of the patient were reviewed by me and considered in my medical decision making (see chart for details).     We will treat with Bactrim, ibuprofen.  Discussed return precautions for worsening symptoms.  Final Clinical Impressions(s) /  UC Diagnoses   Final  diagnoses:  Acute otitis externa of right ear, unspecified type   Discharge Instructions   None    ED Prescriptions     Medication Sig Dispense Auth. Provider   sulfamethoxazole-trimethoprim (BACTRIM DS) 800-160 MG tablet Take 1 tablet by mouth 2 (two) times daily for 7 days. 14 tablet Particia Nearing, [place]   ibuprofen (ADVIL) 800 MG tablet Take 1 tablet (800 mg total) by mouth 3 (three) times daily. 21 tablet Particia Nearing, [place]      PDMP not reviewed this encounter.   Particia Nearing, [place] 08/23/20 1151

## 2020-08-23 NOTE — ED Triage Notes (Signed)
Pt presents with right ear pain xs 1 day. States pain radiates into face now.

## 2020-09-14 ENCOUNTER — Encounter: Payer: Self-pay | Admitting: Obstetrics

## 2020-09-14 ENCOUNTER — Other Ambulatory Visit (HOSPITAL_COMMUNITY)
Admission: RE | Admit: 2020-09-14 | Discharge: 2020-09-14 | Disposition: A | Discharge status: Home / Self Care | Payer: Medicaid Other | Source: Ambulatory Visit | Attending: Obstetrics | Admitting: Obstetrics

## 2020-09-14 ENCOUNTER — Ambulatory Visit (INDEPENDENT_AMBULATORY_CARE_PROVIDER_SITE_OTHER): Payer: Medicaid Other | Admitting: Obstetrics

## 2020-09-14 ENCOUNTER — Other Ambulatory Visit: Payer: Self-pay

## 2020-09-14 VITALS — BP 136/83 | HR 60 | Ht 65.0 in | Wt 161.5 lb

## 2020-09-14 DIAGNOSIS — Z01419 Encounter for gynecological examination (general) (routine) without abnormal findings: Secondary | ICD-10-CM

## 2020-09-14 DIAGNOSIS — R350 Frequency of micturition: Secondary | ICD-10-CM | POA: Diagnosis not present

## 2020-09-14 DIAGNOSIS — N318 Other neuromuscular dysfunction of bladder: Secondary | ICD-10-CM

## 2020-09-14 DIAGNOSIS — N898 Other specified noninflammatory disorders of vagina: Secondary | ICD-10-CM | POA: Insufficient documentation

## 2020-09-14 DIAGNOSIS — Z113 Encounter for screening for infections with a predominantly sexual mode of transmission: Secondary | ICD-10-CM | POA: Diagnosis not present

## 2020-09-14 LAB — POCT URINALYSIS DIPSTICK
Bilirubin, UA: NEGATIVE
Blood, UA: NEGATIVE
Glucose, UA: NEGATIVE
Ketones, UA: NEGATIVE
Leukocytes, UA: NEGATIVE
Nitrite, UA: NEGATIVE
Odor: NEGATIVE
Protein, UA: NEGATIVE
Spec Grav, UA: 1.01 (ref 1.010–1.025)
Urobilinogen, UA: 0.2 E.U./dL
pH, UA: 6 (ref 5.0–8.0)

## 2020-09-14 NOTE — Progress Notes (Signed)
Pt presents for annual, pap, and all STD testing. Pt c/o urinary urgency and frequency.  Normal pap 07/02/2019

## 2020-09-14 NOTE — Progress Notes (Signed)
Subjective:        Victoria Solis is a 30 y.o. female here for a routine exam.  Current complaints: Vaginal discharge and urinary urgency-frequency.    Personal health questionnaire:  Is patient Ashkenazi Jewish, have a family history of breast and/or ovarian cancer: no Is there a family history of uterine cancer diagnosed at age < 29, gastrointestinal cancer, urinary tract cancer, family member who is a Personnel officer syndrome-associated carrier: no Is the patient overweight and hypertensive, family history of diabetes, personal history of gestational diabetes, preeclampsia or PCOS: no Is patient over 69, have PCOS,  family history of premature CHD under age 54, diabetes, smoke, have hypertension or peripheral artery disease:  no At any time, has a partner hit, kicked or otherwise hurt or frightened you?: no Over the past 2 weeks, have you felt down, depressed or hopeless?: no Over the past 2 weeks, have you felt little interest or pleasure in doing things?:no   Gynecologic History Patient's last menstrual period was 08/21/2020. Contraception: tubal ligation Last Pap: 07-02-2019. Results were: normal Last mammogram: n/a. Results were: n/a  Obstetric History OB History  Gravida Para Term Preterm AB Living  4 3 3     3   SAB IAB Ectopic Multiple Live Births        0 3    # Outcome Date GA Lbr Len/2nd Weight Sex Delivery Anes PTL Lv  4 Gravida           3 Term 07/23/17 [redacted]w[redacted]d 04:55 / 00:13 6 lb 14.4 oz (3.13 kg) M VBAC, Vacuum EPI  LIV  2 Term      Vag-Spont   LIV  1 Term      CS-Unspec   LIV    Past Medical History:  Diagnosis Date   Migraine     Past Surgical History:  Procedure Laterality Date   CESAREAN SECTION     CESAREAN SECTION N/A 09/19/2018   Procedure: CESAREAN SECTION;  Surgeon: 09/21/2018, MD;  Location: [facility];  Service: Obstetrics;  Laterality: N/A;   CESAREAN SECTION WITH BILATERAL TUBAL LIGATION Bilateral 09/19/2018   Procedure: CESAREAN SECTION WITH  BILATERAL TUBAL LIGATION;  Surgeon: 09/21/2018, MD;  Location: [facility];  Service: Obstetrics;  Laterality: Bilateral;     Current Outpatient Medications:    diphenhydrAMINE (BENADRYL) 50 MG tablet, Take 1 tablet (50 mg total) by mouth once for 1 dose., Disp: 30 tablet, Rfl: 0   hydrOXYzine (ATARAX/VISTARIL) 25 MG tablet, Take 0.5-1 tablets (12.5-25 mg total) by mouth every 8 (eight) hours as needed for itching. (Patient not taking: No sig reported), Disp: 30 tablet, Rfl: 0   ibuprofen (ADVIL) 800 MG tablet, Take 1 tablet (800 mg total) by mouth 3 (three) times daily. (Patient not taking: Reported on 09/14/2020), Disp: 21 tablet, Rfl: 0   nystatin-triamcinolone ointment (MYCOLOG), Apply 1 application topically 2 (two) times daily. (Patient not taking: No sig reported), Disp: 15 g, Rfl: 0  Current Facility-Administered Medications:    clindamycin (CLEOCIN) 2 % vaginal cream 1 Applicatorful, 1 Applicatorful, Vaginal, QHS, 09/16/2020, MD Allergies  Allergen Reactions   Amoxicillin Hives   Latex Itching   Metronidazole Swelling    Lip swelling / peeling / itching    Penicillins Hives    Has patient had a PCN reaction causing immediate rash, facial/tongue/throat swelling, SOB or lightheadedness with hypotension: Yes Has patient had a PCN reaction causing severe rash involving mucus membranes or skin necrosis: Yes Has  patient had a PCN reaction that required hospitalization: No Has patient had a PCN reaction occurring within the last 10 years: No If all of the above answers are "NO", then may proceed with Cephalosporin use.     Social History   Tobacco Use   Smoking status: Never   Smokeless tobacco: Never  Substance Use Topics   Alcohol use: Yes    Comment: occ    Family History  Problem Relation Age of Onset   Hypertension Mother    Diabetes Mother    Stroke Mother    Kidney failure Mother    Hypertension Father    Diabetes Father       Review of  Systems  Constitutional: negative for fatigue and weight loss Respiratory: negative for cough and wheezing Cardiovascular: negative for chest pain, fatigue and palpitations Gastrointestinal: negative for abdominal pain and change in bowel habits Musculoskeletal:negative for myalgias Neurological: negative for gait problems and tremors Behavioral/Psych: negative for abusive relationship, depression Endocrine: negative for temperature intolerance    Genitourinary: positive for vaginal discharge and urinary urgency-frequency.  negative for abnormal menstrual periods, genital lesions, hot flashes, sexual problems  Integument/breast: negative for breast lump, breast tenderness, nipple discharge and skin lesion(s)    Objective:       BP 136/83   Pulse 60   Ht 5\' 5"  (1.651 m)   Wt 161 lb 8 oz (73.3 kg)   LMP 08/21/2020   BMI 26.88 kg/m  General:   Alert and no distress  Skin:   no rash or abnormalities  Lungs:   clear to auscultation bilaterally  Heart:   regular rate and rhythm, S1, S2 normal, no murmur, click, rub or gallop  Breasts:   normal without suspicious masses, skin or nipple changes or axillary nodes  Abdomen:  normal findings: no organomegaly, soft, non-tender and no hernia  Pelvis:  External genitalia: normal general appearance Urinary system: urethral meatus normal and bladder without fullness, nontender Vaginal: normal without tenderness, induration or masses Cervix: normal appearance Adnexa: normal bimanual exam Uterus: anteverted and non-tender, normal size   Lab Review Urine pregnancy test Labs reviewed yes Radiologic studies reviewed no  I have spent a total of 20 minutes of face-to-face time, excluding clinical staff time, reviewing notes and preparing to see patient, ordering tests and/or medications, and counseling the patient.   Assessment:    1. Encounter for gynecological examination with Papanicolaou smear of cervix Rx: - Cytology - PAP( [facility])  2. Vaginal discharge Rx: - Cervicovaginal ancillary only( [place] [facility])  3. Screening for STD (sexually transmitted disease) Rx: - Hepatitis B surface antigen - Hepatitis C antibody - HIV Antibody (routine testing w rflx) - RPR  4.. Frequency-urgency syndrome Rx: - POCT Urinalysis Dipstick     Plan:    Education reviewed: calcium supplements, depression evaluation, low fat, low cholesterol diet, safe sex/STD prevention, self breast exams, and weight bearing exercise. Contraception: tubal ligation. Follow up in: 1 year.     Orders Placed This Encounter  Procedures   Hepatitis B surface antigen   Hepatitis C antibody   HIV Antibody (routine testing w rflx)   RPR   POCT Urinalysis Dipstick     08/23/2020, MD 09/14/2020 2:45 PM

## 2020-09-15 LAB — CERVICOVAGINAL ANCILLARY ONLY
Bacterial Vaginitis (gardnerella): POSITIVE — AB
Candida Glabrata: NEGATIVE
Candida Vaginitis: NEGATIVE
Chlamydia: NEGATIVE
Comment: NEGATIVE
Comment: NEGATIVE
Comment: NEGATIVE
Comment: NEGATIVE
Comment: NEGATIVE
Comment: NORMAL
Neisseria Gonorrhea: NEGATIVE
Trichomonas: NEGATIVE

## 2020-09-15 LAB — RPR: RPR Ser Ql: NONREACTIVE

## 2020-09-15 LAB — HEPATITIS C ANTIBODY: Hep C Virus Ab: <0.1 s/co ratio (ref 0.0–0.9)

## 2020-09-15 LAB — HEPATITIS B SURFACE ANTIGEN: Hepatitis B Surface Ag: NEGATIVE

## 2020-09-15 LAB — CYTOLOGY - PAP
Comment: NEGATIVE
Diagnosis: NEGATIVE
High risk HPV: NEGATIVE

## 2020-09-15 LAB — HIV ANTIBODY (ROUTINE TESTING W REFLEX): HIV Screen 4th Generation wRfx: NONREACTIVE

## 2020-10-04 ENCOUNTER — Ambulatory Visit: Admit: 2020-10-04 | Disposition: A | Discharge status: Other Acute Inpatient Hospital | Payer: Medicaid Other

## 2020-10-04 ENCOUNTER — Ambulatory Visit (HOSPITAL_COMMUNITY)
Admission: EM | Admit: 2020-10-04 | Discharge: 2020-10-04 | Disposition: A | Discharge status: Home / Self Care | Payer: Medicaid Other | Attending: Student | Admitting: Student

## 2020-10-04 ENCOUNTER — Encounter (HOSPITAL_COMMUNITY): Payer: Self-pay

## 2020-10-04 DIAGNOSIS — R21 Rash and other nonspecific skin eruption: Secondary | ICD-10-CM

## 2020-10-04 MED ORDER — DIPHENHYDRAMINE HCL 25 MG PO TABS: 25.0000 mg | ORAL_TABLET | Freq: Four times a day (QID) | ORAL | 0 refills | Status: DC | PRN

## 2020-10-04 MED ORDER — NYSTATIN-TRIAMCINOLONE 100000-0.1 UNIT/GM-% EX OINT: 1.0000 "application " | TOPICAL_OINTMENT | Freq: Two times a day (BID) | CUTANEOUS | 0 refills | Status: AC

## 2020-10-04 NOTE — Discharge Instructions (Addendum)
-  Nystatin-triamcinolone ointment 2x daily for about 7 days. -Benedryl (diphenhydramine) 25-50mg  (1-2 pills) as needed for itching, up to every 6 hours.  This medication will cause drowsiness.  -Wash with gentle soap and water only

## 2020-10-04 NOTE — ED Triage Notes (Signed)
Pt in with c/o rash on right upper arm th started Saturday  States her arm was swollen and now she has bumps  Pt states she has been using bleach and ointment

## 2020-10-04 NOTE — ED Provider Notes (Signed)
[facility]    CSN: [id number] Arrival date & time: 10/04/20  0919      History   Chief Complaint Chief Complaint  Patient presents with   Rash    HPI Victoria Solis is a 30 y.o. female presenting with rash upper R arm x3 days. States arm was initially swollen like with a mosquito bite, now with bumps. Exquisitely pruritic. Minimal improvement after applying bleach 1 day ago. Denies rashes or lesions elsewhere.   HPI  Past Medical History:  Diagnosis Date   Migraine     Patient Active Problem List   Diagnosis Date Noted   Trichomonal vaginitis 07/21/2019   Hx of chlamydia infection 10/27/2018   Status post tubal ligation 10/27/2018   Abnormal uterine bleeding (AUB) 10/27/2018    Past Surgical History:  Procedure Laterality Date   CESAREAN SECTION     CESAREAN SECTION N/A 09/19/2018   Procedure: CESAREAN SECTION;  Surgeon: Catalina Antigua, MD;  Location: [facility];  Service: Obstetrics;  Laterality: N/A;   CESAREAN SECTION WITH BILATERAL TUBAL LIGATION Bilateral 09/19/2018   Procedure: CESAREAN SECTION WITH BILATERAL TUBAL LIGATION;  Surgeon: Catalina Antigua, MD;  Location: [facility];  Service: Obstetrics;  Laterality: Bilateral;    OB History     Gravida  4   Para  3   Term  3   Preterm      AB      Living  3      SAB      IAB      Ectopic      Multiple  0   Live Births  3            Home Medications    Prior to Admission medications   Medication Sig Start Date End Date Taking? Authorizing Provider  diphenhydrAMINE (BENADRYL) 25 MG tablet Take 1 tablet (25 mg total) by mouth every 6 (six) hours as needed. This medication can cause drowsiness. 10/04/20  Yes Rhys Martini, PA-C  nystatin-triamcinolone ointment [facility]) Apply 1 application topically 2 (two) times daily for 7 days. 10/04/20 10/11/20  Rhys Martini, PA-C    Family History Family History  Problem Relation Age of Onset   Hypertension Mother    Diabetes  Mother    Stroke Mother    Kidney failure Mother    Hypertension Father    Diabetes Father     Social History Social History   Tobacco Use   Smoking status: Never   Smokeless tobacco: Never  Vaping Use   Vaping Use: Never used  Substance Use Topics   Alcohol use: Yes    Comment: occ   Drug use: No     Allergies   Amoxicillin, Latex, Metronidazole, and Penicillins   Review of Systems Review of Systems  Skin:  Positive for rash.  All other systems reviewed and are negative.   Physical Exam Triage Vital Signs ED Triage Vitals  Enc Vitals Group     BP 10/04/20 1039 119/76     Pulse Rate 10/04/20 1039 (!) 54     Resp 10/04/20 1039 19     Temp 10/04/20 1039 97.8 F (36.6 C)     Temp Source 10/04/20 1039 Oral     SpO2 10/04/20 1039 99 %     Weight --      Height --      Head Circumference --      Peak Flow --      Pain  Score 10/04/20 1037 0     Pain Loc --      Pain Edu? --      Excl. in GC? --    No data found.  Updated Vital Signs BP 119/76 (BP Location: Left Arm)   Pulse (!) 54   Temp 97.8 F (36.6 C) (Oral)   Resp 19   LMP 10/03/2020 (Exact Date)   SpO2 99%   Visual Acuity Right Eye Distance:   Left Eye Distance:   Bilateral Distance:    Right Eye Near:   Left Eye Near:    Bilateral Near:     Physical Exam Vitals reviewed.  Constitutional:      General: She is not in acute distress.    Appearance: Normal appearance. She is not ill-appearing or diaphoretic.  HENT:     Head: Normocephalic and atraumatic.  Cardiovascular:     Rate and Rhythm: Normal rate and regular rhythm.     Heart sounds: Normal heart sounds.  Pulmonary:     Effort: Pulmonary effort is normal.     Breath sounds: Normal breath sounds.  Skin:    General: Skin is warm.     Comments: See image below  R upper inner arm with small patch of flesh colored papules, without warmth erythema discharge or excoriation. No rash or lesions elsewhere. No facial involvement.    Neurological:     General: No focal deficit present.     Mental Status: She is alert and oriented to person, place, and time.  Psychiatric:        Mood and Affect: Mood normal.        Behavior: Behavior normal.        Thought Content: Thought content normal.        Judgment: Judgment normal.       UC Treatments / Results  Labs (all labs ordered are listed, but only abnormal results are displayed) Labs Reviewed - No data to display  EKG   Radiology No results found.  Procedures Procedures (including critical care time)  Medications Ordered in UC Medications - No data to display  Initial Impression / Assessment and Plan / UC Course  I have reviewed the triage vital signs and the nursing notes.  Pertinent labs & imaging results that were available during my care of the patient were reviewed by me and considered in my medical decision making (see chart for details).     This patient is a very pleasant 30 y.o. year old female presenting with rash R inner arm. Possibly insect bite. Afebrile, nontachy. Triamcinolone-nystatin ointment, benedryl for symptomatic relief. Stop using bleach on the area. ED return precautions discussed. Patient verbalizes understanding and agreement.    Final Clinical Impressions(s) / UC Diagnoses   Final diagnoses:  Rash and nonspecific skin eruption     Discharge Instructions      -Nystatin-triamcinolone ointment 2x daily for about 7 days. -Benedryl (diphenhydramine) 25-50mg  (1-2 pills) as needed for itching, up to every 6 hours.  This medication will cause drowsiness.  -Wash with gentle soap and water only     ED Prescriptions     Medication Sig Dispense Auth. Provider   nystatin-triamcinolone ointment (MYCOLOG) Apply 1 application topically 2 (two) times daily for 7 days. 15 g Rhys Martini, PA-C   diphenhydrAMINE (BENADRYL) 25 MG tablet Take 1 tablet (25 mg total) by mouth every 6 (six) hours as needed. This medication can cause  drowsiness. 30 tablet Rhys Martini, PA-C  PDMP not reviewed this encounter.   Rhys Martini, PA-C 10/04/20 1108

## 2020-11-25 ENCOUNTER — Ambulatory Visit: Payer: Medicaid Other

## 2021-01-04 ENCOUNTER — Other Ambulatory Visit: Payer: Self-pay

## 2021-01-04 ENCOUNTER — Ambulatory Visit (INDEPENDENT_AMBULATORY_CARE_PROVIDER_SITE_OTHER): Payer: Medicaid Other

## 2021-01-04 ENCOUNTER — Other Ambulatory Visit (HOSPITAL_COMMUNITY)
Admission: RE | Admit: 2021-01-04 | Discharge: 2021-01-04 | Disposition: A | Discharge status: Home / Self Care | Payer: Medicaid Other | Source: Ambulatory Visit | Attending: Obstetrics & Gynecology | Admitting: Obstetrics & Gynecology

## 2021-01-04 DIAGNOSIS — R300 Dysuria: Secondary | ICD-10-CM

## 2021-01-04 DIAGNOSIS — N898 Other specified noninflammatory disorders of vagina: Secondary | ICD-10-CM | POA: Diagnosis present

## 2021-01-04 LAB — POCT URINALYSIS DIPSTICK
Bilirubin, UA: NEGATIVE
Blood, UA: NEGATIVE
Glucose, UA: NEGATIVE
Ketones, UA: NEGATIVE
Leukocytes, UA: NEGATIVE
Nitrite, UA: NEGATIVE
Protein, UA: NEGATIVE
Spec Grav, UA: 1.01 (ref 1.010–1.025)
Urobilinogen, UA: 0.2 E.U./dL
pH, UA: 6.5 (ref 5.0–8.0)

## 2021-01-04 NOTE — Progress Notes (Signed)
SUBJECTIVE:  30 y.o. female complains of white vaginal discharge. Denies abnormal vaginal bleeding or significant pelvic pain or fever. Pt c/o urinary discomfort.    OBJECTIVE:  She appears well, afebrile. Urine dipstick: negative for all components.  ASSESSMENT:  Vaginal Discharge     PLAN:  GC, chlamydia, trichomonas, BVAG, CVAG probe, and urine culture sent to lab. Treatment: To be determined once lab results are reviewed ROV prn if symptoms persist or worsen.

## 2021-01-05 LAB — CERVICOVAGINAL ANCILLARY ONLY
Bacterial Vaginitis (gardnerella): POSITIVE — AB
Candida Glabrata: NEGATIVE
Candida Vaginitis: NEGATIVE
Chlamydia: NEGATIVE
Comment: NEGATIVE
Comment: NEGATIVE
Comment: NEGATIVE
Comment: NEGATIVE
Comment: NEGATIVE
Comment: NORMAL
Neisseria Gonorrhea: NEGATIVE
Trichomonas: NEGATIVE

## 2021-01-05 MED ORDER — CLINDAMYCIN HCL 300 MG PO CAPS: 300.0000 mg | ORAL_CAPSULE | Freq: Two times a day (BID) | ORAL | 0 refills | Status: AC

## 2021-01-05 NOTE — Progress Notes (Signed)
Patient was assessed and managed by nursing staff during this encounter. I have reviewed the chart and agree with the documentation and plan. I have also made any necessary editorial changes.  Scheryl Darter, MD 01/05/2021 4:36 PM

## 2021-01-05 NOTE — Addendum Note (Signed)
Addended by: Adam Phenix on: 01/05/2021 04:37 PM   Modules accepted: Orders

## 2021-01-07 LAB — URINE CULTURE

## 2021-02-20 ENCOUNTER — Other Ambulatory Visit: Payer: Self-pay | Admitting: *Deleted

## 2021-02-20 ENCOUNTER — Other Ambulatory Visit: Payer: Self-pay

## 2021-02-20 ENCOUNTER — Encounter: Payer: Self-pay | Admitting: Obstetrics

## 2021-02-20 DIAGNOSIS — B9689 Other specified bacterial agents as the cause of diseases classified elsewhere: Secondary | ICD-10-CM

## 2021-02-20 DIAGNOSIS — N760 Acute vaginitis: Secondary | ICD-10-CM

## 2021-02-20 MED ORDER — CLINDAMYCIN HCL 300 MG PO CAPS: 300.0000 mg | ORAL_CAPSULE | Freq: Two times a day (BID) | ORAL | 0 refills | Status: DC

## 2021-02-20 NOTE — Progress Notes (Signed)
Pt did not pick up the Clindamycin rx Dr. Roselie Awkward sent in Dec. She is experiencing malodorous vaginal discharge. Rx sent to the pharmacy

## 2021-03-01 IMAGING — US US MFM OB COMP + 14 WK
1 series · 13 of 28 positions shown · non-contrast
Comparison: none

[Series 1: us mfm ob comp + 14 wk · 64 acquisitions, 13 frames shown]
[im 3/64]
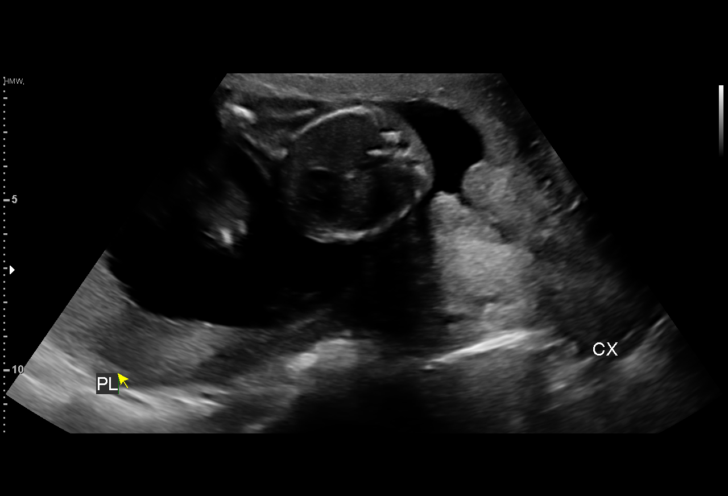
[im 8/64]
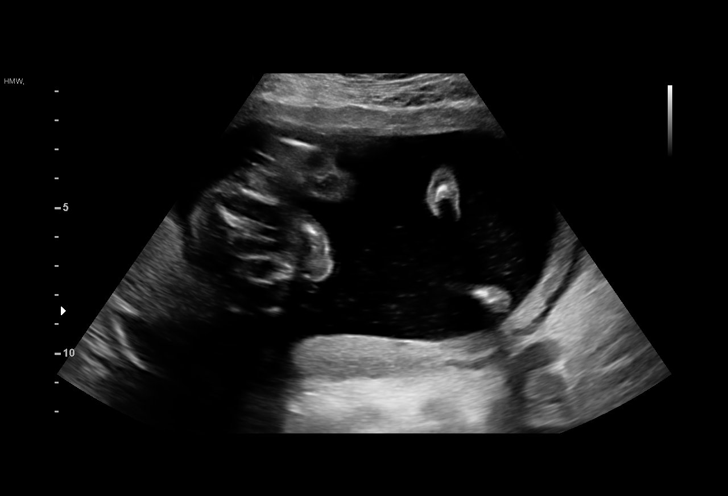
[im 12/64]
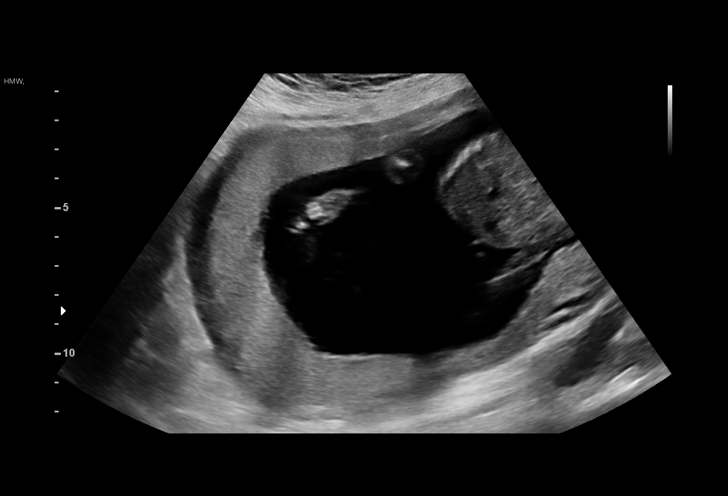
[im 17/64]
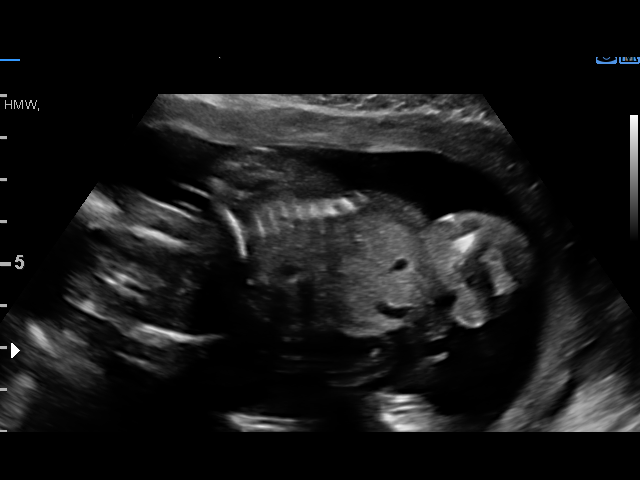
[im 22/64]
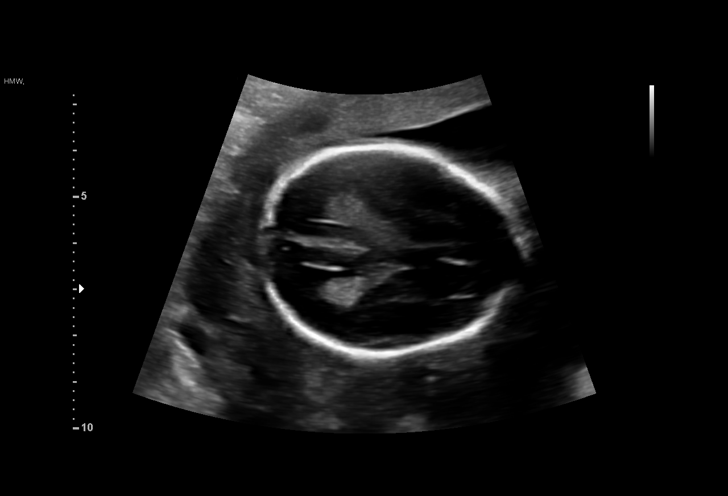
[im 26/64]
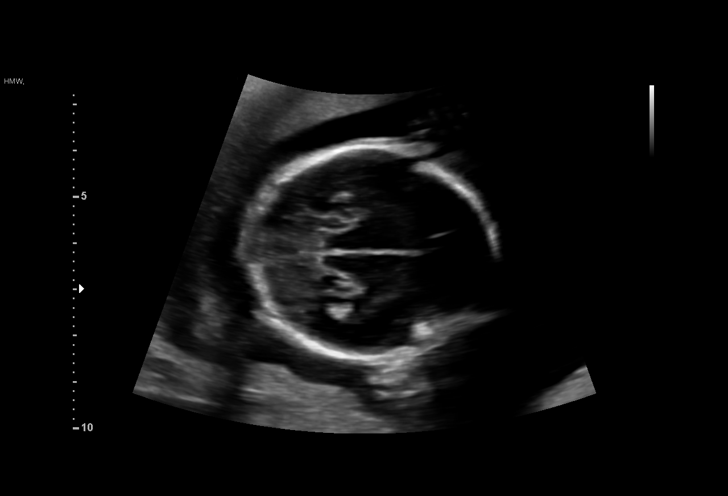
[im 33/64]
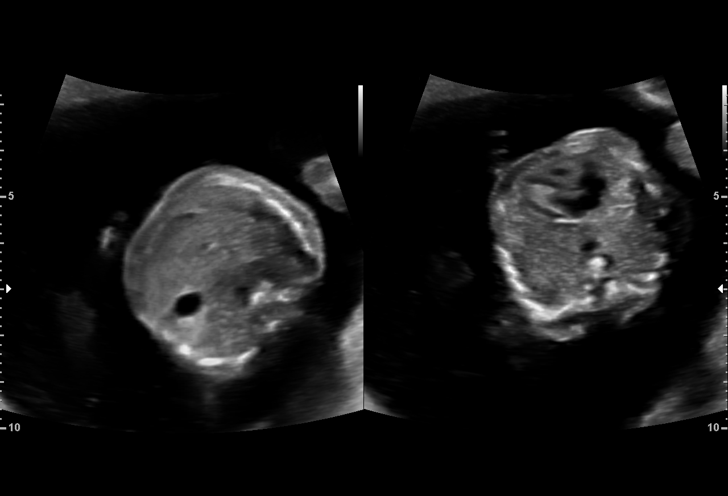
[im 38/64]
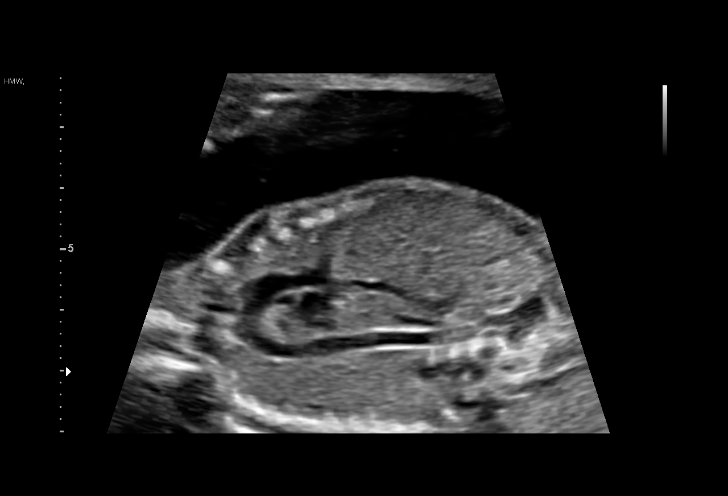
[im 43/64]
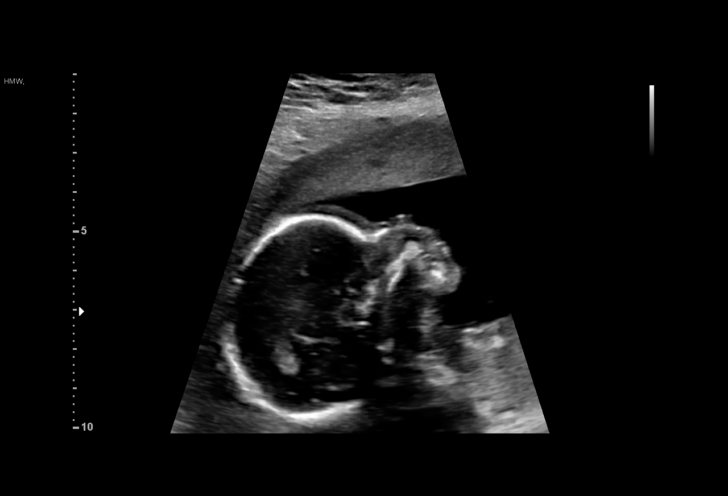
[im 47/64]
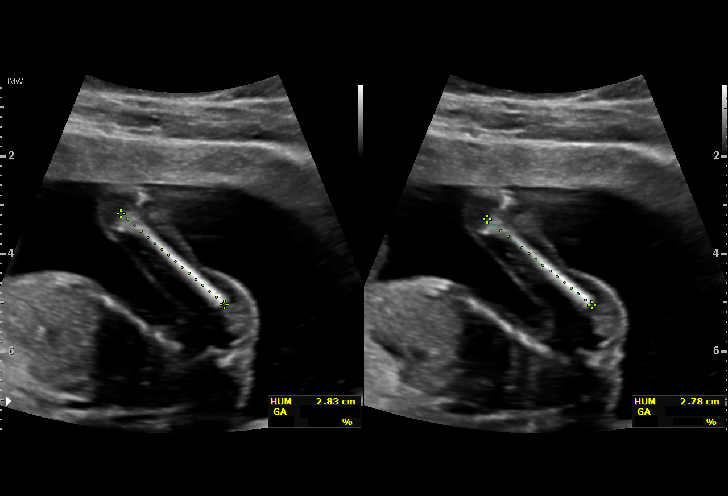
[im 52/64]
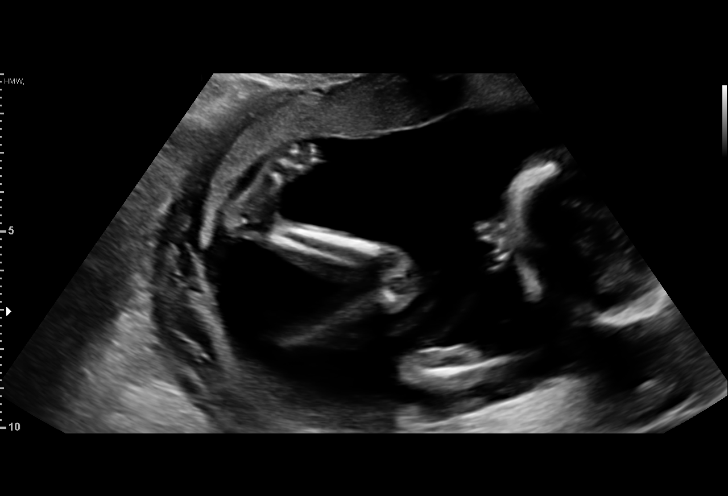
[im 57/64]
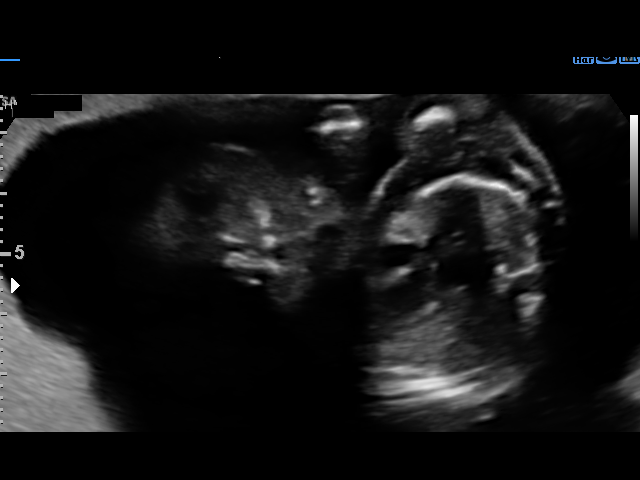
[im 61/64]
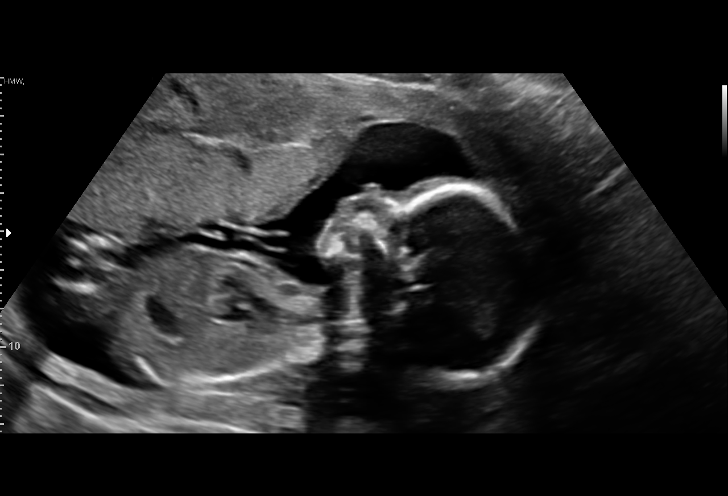

[13 of 28 positions shown; findings below may reference images not displayed]

[REDACTED]

  1  US MFM OB COMP + 14 WK               76805.01     DEX
 ----------------------------------------------------------------------

 ----------------------------------------------------------------------
Indications

  19 weeks gestation of pregnancy
  Encounter for antenatal screening for
  malformations (low risk NIPS)
  Previous cesarean delivery, antepartum
  Short interval between pregancies, 2nd
  trimester
 ----------------------------------------------------------------------
Fetal Evaluation

 Num Of Fetuses:         1
 Fetal Heart Rate(bpm):  130
 Cardiac Activity:       Observed
 Presentation:           Breech
 Placenta:               Posterior
 P. Cord Insertion:      Visualized, central

 Amniotic Fluid
 AFI FV:      Within normal limits

                             Largest Pocket(cm)

Biometry

 BPD:        45  mm     G. Age:  19w 4d         76  %    CI:        76.59   %    70 - 86
                                                         FL/HC:      17.2   %    16.1 -
 HC:      162.9  mm     G. Age:  19w 1d         46  %    HC/AC:      1.21        1.09 -
 AC:       135   mm     G. Age:  19w 0d         45  %    FL/BPD:     62.2   %
 FL:         28  mm     G. Age:  18w 4d         29  %    FL/AC:      20.7   %    20 - 24
 HUM:        28  mm     G. Age:  19w 0d         50  %
 CER:      18.8  mm     G. Age:  18w 3d         32  %
 NFT:       2.2  mm
 CM:        5.5  mm

 Est. FW:     260  gm      0 lb 9 oz     41  %
OB History

 Gravidity:    4         Term:   3        Prem:   0        SAB:   0
 TOP:          0       Ectopic:  0        Living: 3
Gestational Age

 U/S Today:     19w 1d                                        EDD:   10/20/18
 Best:          19w 0d     Det. By:  Early Ultrasound         EDD:   10/21/18
                                     (03/10/18)
Anatomy

 Cranium:               Appears normal         Aortic Arch:            Appears normal
 Cavum:                 Appears normal         Ductal Arch:            Appears normal
 Ventricles:            Appears normal         Diaphragm:              Appears normal
 Choroid Plexus:        Appears normal         Stomach:                Appears normal, left
                                                                       sided
 Cerebellum:            Appears normal         Abdomen:                Appears normal
 Posterior Fossa:       Appears normal         Abdominal Wall:         Appears nml (cord
                                                                       insert, abd wall)
 Nuchal Fold:           Appears normal         Cord Vessels:           Appears normal (3
                                                                       vessel cord)
 Face:                  Appears normal         Kidneys:                Appear normal
                        (orbits and profile)
 Lips:                  Appears normal         Bladder:                Appears normal
 Thoracic:              Appears normal         Spine:                  Not well visualized
 Heart:                 Not well visualized    Upper Extremities:      Appears normal
 RVOT:                  Not well visualized    Lower Extremities:      Appears normal
 LVOT:                  Not well visualized

 Other:  Fetus appears to be a male. Heels visualized. Technically difficult due
         to maternal habitus and fetal position.
Cervix Uterus Adnexa

 Cervix
 Length:            3.4  cm.
 Normal appearance by transabdominal scan.

 Uterus
 No abnormality visualized.

 Left Ovary
 No adnexal mass visualized.

 Right Ovary
 No adnexal mass visualized.

 Cul De Sac
 No free fluid seen.

 Adnexa
 No abnormality visualized.
Impression

 Normal interval growth.  No ultrasonic evidence of structural
 fetal anomalies.
 Suboptimal views of the fetal anatomy obtained secondary to
 fetal position.
Recommendations

 Follow up anatomy scheduled in 4 weeks.

## 2021-03-30 IMAGING — US US MFM OB FOLLOW UP
1 series · 14 of 28 positions shown · non-contrast
Comparison: none

[Series 1: us mfm ob follow up · 53 acquisitions, 14 frames shown]
[im 2/53]
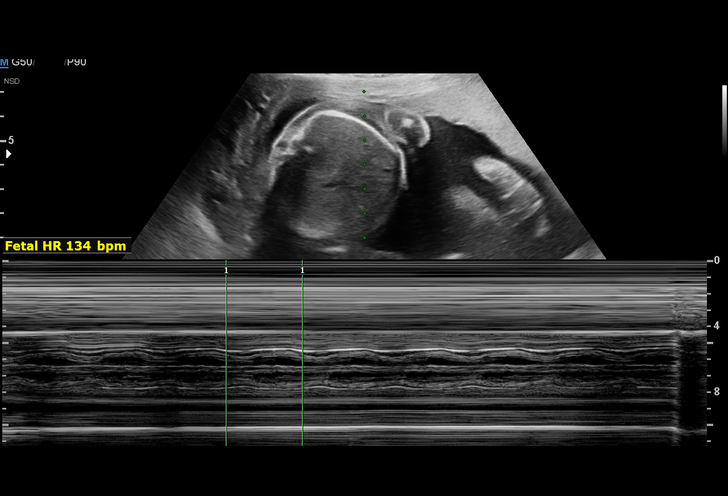
[im 6/53]
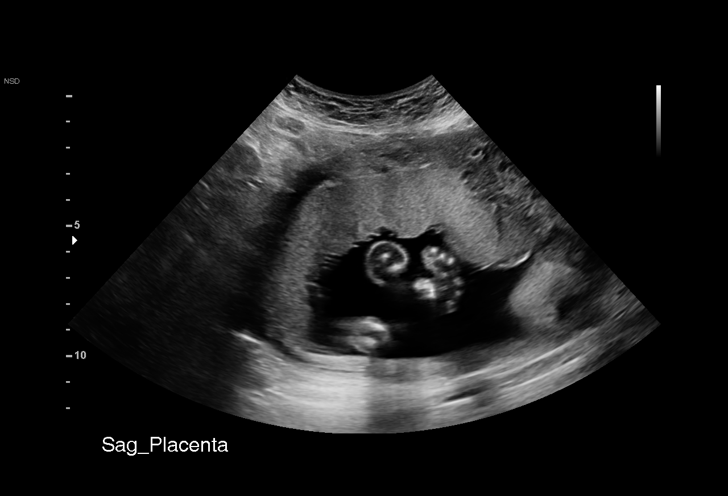
[im 10/53]
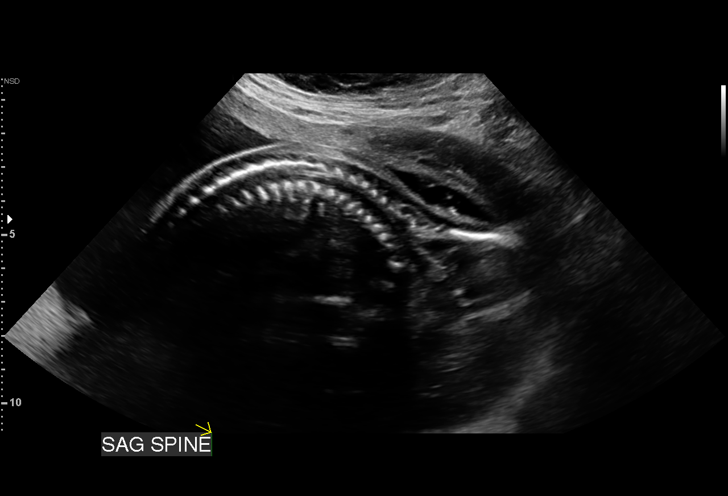
[im 14/53]
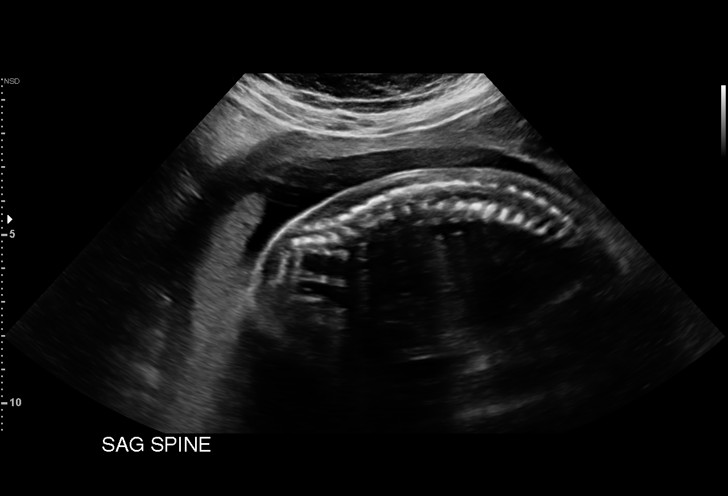
[im 18/53]
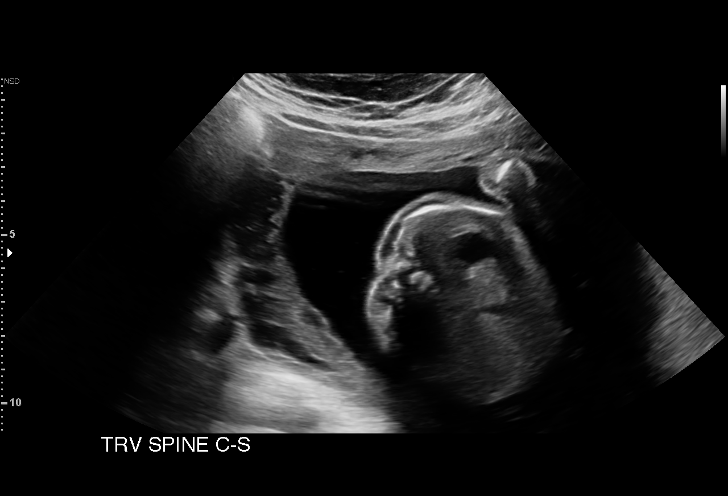
[im 22/53]
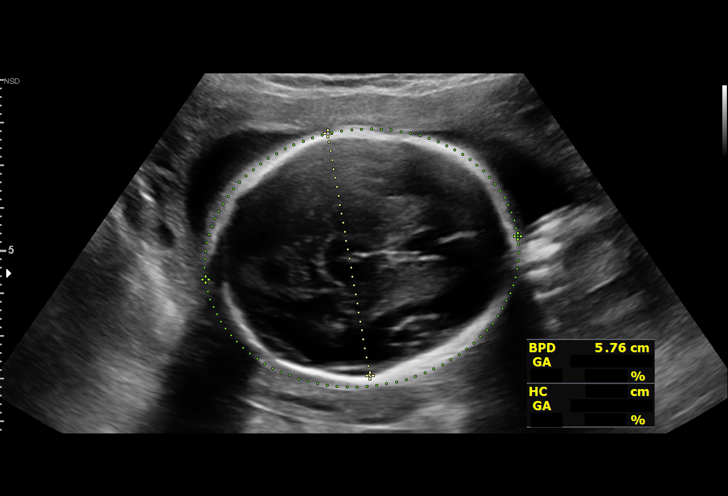
[im 26/53]
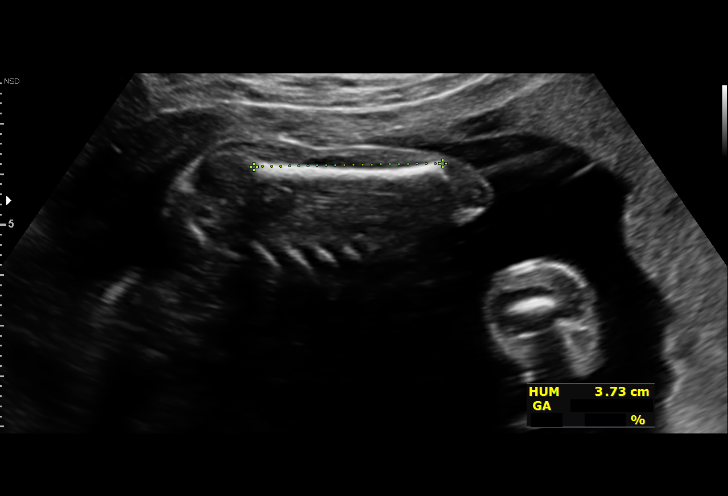
[im 29/53]
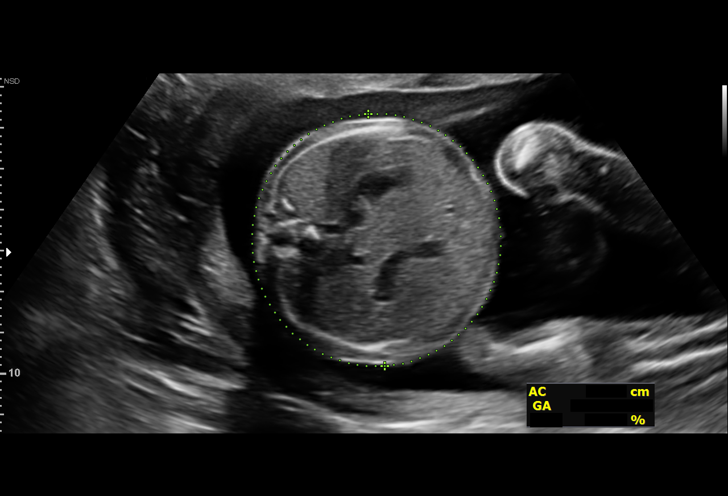
[im 33/53]
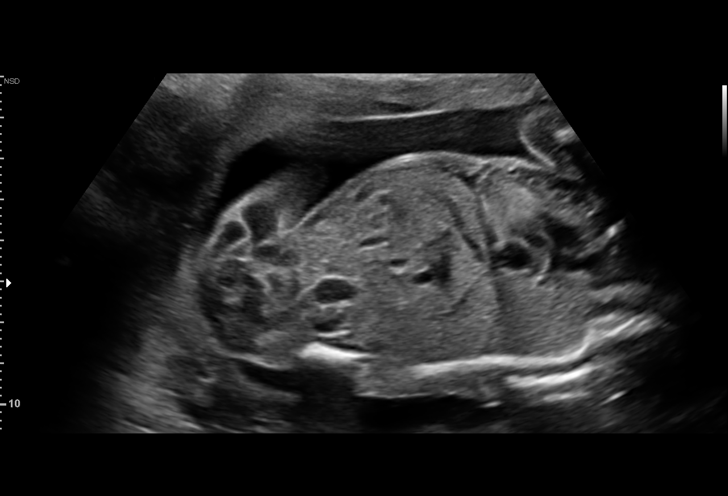
[im 37/53]
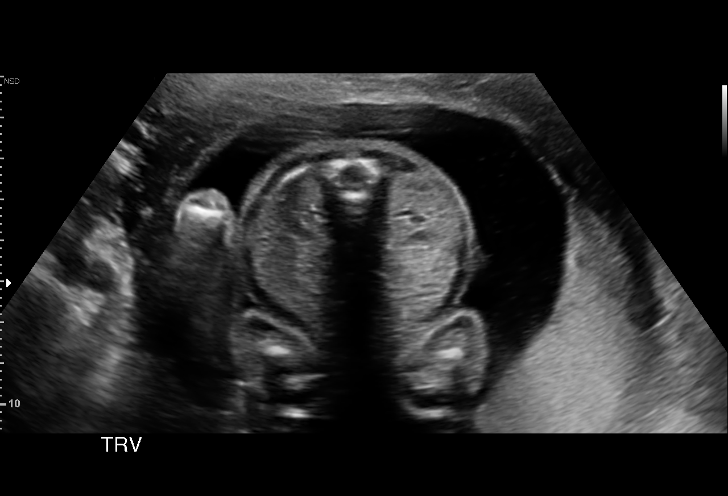
[im 41/53]
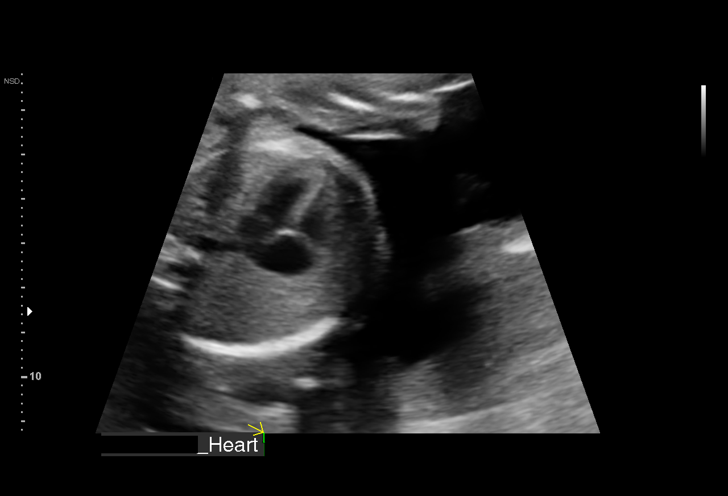
[im 45/53]
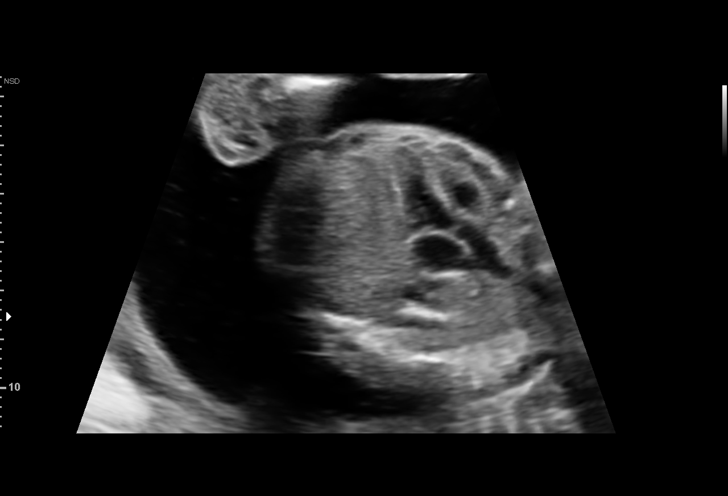
[im 49/53]
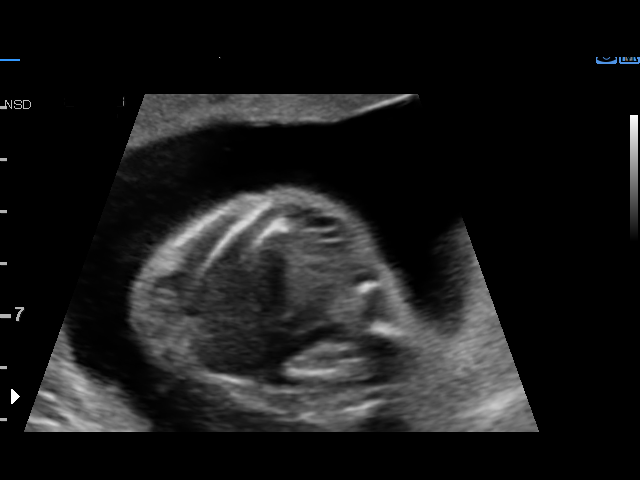
[im 53/53]
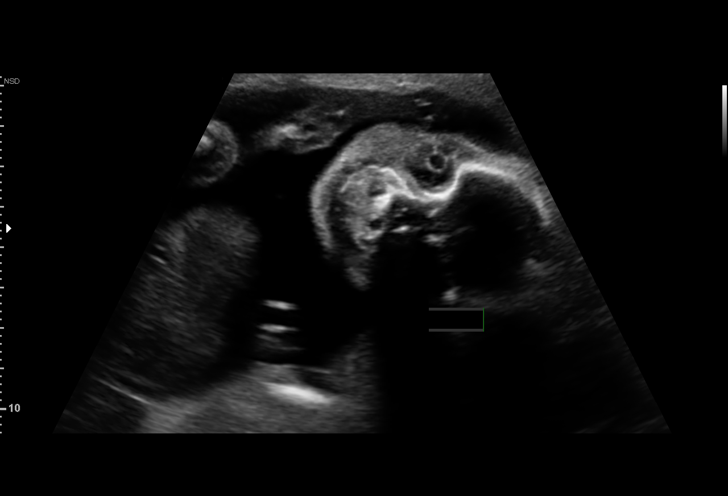

[14 of 28 positions shown; findings below may reference images not displayed]

[REDACTED]

 ----------------------------------------------------------------------

 ----------------------------------------------------------------------
Indications

  Encounter for antenatal screening for
  malformations (low risk NIPS)
  23 weeks gestation of pregnancy
  Previous cesarean delivery, antepartum
  Short interval between pregancies, 2nd
  trimester
  Antenatal follow-up for nonvisualized fetal
  anatomy
 ----------------------------------------------------------------------
Fetal Evaluation

 Num Of Fetuses:         1
 Fetal Heart Rate(bpm):  134
 Cardiac Activity:       Observed
 Presentation:           Cephalic
 Placenta:               Posterior
 P. Cord Insertion:      Previously seen as normal

 Amniotic Fluid
 AFI FV:      Within normal limits

                             Largest Pocket(cm)

Biometry

 BPD:      57.8  mm     G. Age:  23w 5d         66  %    CI:        76.67   %    70 - 86
                                                         FL/HC:      19.7   %    19.2 -
 HC:      209.1  mm     G. Age:  23w 0d         29  %    HC/AC:      1.09        1.05 -
 AC:      192.5  mm     G. Age:  24w 0d         68  %    FL/BPD:     71.1   %    71 - 87
 FL:       41.1  mm     G. Age:  23w 2d         45  %    FL/AC:      21.4   %    20 - 24
 HUM:      37.6  mm     G. Age:  23w 1d         44  %
 LV:        5.5  mm

 Est. FW:     613  gm      1 lb 6 oz     61  %
OB History

 Gravidity:    4         Term:   3        Prem:   0        SAB:   0
 TOP:          0       Ectopic:  0        Living: 3
Gestational Age

 U/S Today:     23w 4d                                        EDD:   10/18/18
 Best:          23w 1d     Det. By:  Early Ultrasound         EDD:   10/21/18
                                     (03/10/18)
Anatomy

 Cranium:               Appears normal         Aortic Arch:            Previously seen
 Cavum:                 Previously seen        Ductal Arch:            Previously seen
 Ventricles:            Appears normal         Diaphragm:              Appears normal
 Choroid Plexus:        Previously seen        Stomach:                Appears normal, left
                                                                       sided
 Cerebellum:            Previously seen        Abdomen:                Appears normal
 Posterior Fossa:       Previously seen        Abdominal Wall:         Previously seen
 Nuchal Fold:           Previously seen        Cord Vessels:           Previously seen
 Face:                  Orbits and profile     Kidneys:                Appear normal
                        previously seen
 Lips:                  Previously seen        Bladder:                Appears normal
 Thoracic:              Appears normal         Spine:                  Appears normal
 Heart:                 Appears normal         Upper Extremities:      Previously seen
                        (4CH, axis, and
                        situs)
 RVOT:                  Appears normal         Lower Extremities:      Previously seen
 LVOT:                  Appears normal

 Other:  Male gender previously seen. Heels previously visualized. Technically
         difficult due to maternal habitus and fetal position.
Cervix Uterus Adnexa

 Cervix
 Length:           3.17  cm.
 Normal appearance by transabdominal scan.
Impression

 Normal interval growth.
Recommendations

 Follow up growth as clinically indicated.

## 2021-05-03 ENCOUNTER — Ambulatory Visit (INDEPENDENT_AMBULATORY_CARE_PROVIDER_SITE_OTHER): Payer: Medicaid Other | Admitting: Obstetrics

## 2021-05-03 ENCOUNTER — Other Ambulatory Visit (HOSPITAL_COMMUNITY)
Admission: RE | Admit: 2021-05-03 | Discharge: 2021-05-03 | Disposition: A | Discharge status: Home / Self Care | Payer: Medicaid Other | Source: Ambulatory Visit | Attending: Obstetrics | Admitting: Obstetrics

## 2021-05-03 ENCOUNTER — Encounter: Payer: Self-pay | Admitting: Obstetrics

## 2021-05-03 VITALS — BP 109/76 | HR 69 | Ht 63.0 in | Wt 172.8 lb

## 2021-05-03 DIAGNOSIS — B3731 Acute candidiasis of vulva and vagina: Secondary | ICD-10-CM

## 2021-05-03 DIAGNOSIS — J301 Allergic rhinitis due to pollen: Secondary | ICD-10-CM

## 2021-05-03 DIAGNOSIS — N898 Other specified noninflammatory disorders of vagina: Secondary | ICD-10-CM | POA: Diagnosis present

## 2021-05-03 DIAGNOSIS — N760 Acute vaginitis: Secondary | ICD-10-CM

## 2021-05-03 DIAGNOSIS — B9689 Other specified bacterial agents as the cause of diseases classified elsewhere: Secondary | ICD-10-CM

## 2021-05-03 LAB — POCT URINALYSIS DIPSTICK: Spec Grav, UA: 1.02 (ref 1.010–1.025)

## 2021-05-03 MED ORDER — METRONIDAZOLE 0.75 % VA GEL: VAGINAL | 5 refills | Status: DC

## 2021-05-03 MED ORDER — FLUCONAZOLE 150 MG PO TABS: 150.0000 mg | ORAL_TABLET | Freq: Once | ORAL | 0 refills | Status: AC

## 2021-05-03 MED ORDER — LORATADINE 10 MG PO TABS: 10.0000 mg | ORAL_TABLET | Freq: Every day | ORAL | 11 refills | Status: DC

## 2021-05-03 NOTE — Addendum Note (Signed)
Addended by: Jearld Adjutant on: 05/03/2021 03:04 PM ? ? Modules accepted: Orders ? ?

## 2021-05-03 NOTE — Progress Notes (Signed)
GYN pt in office for problem visit. Pt c/o of spotting and white discharge 1 week ago. Spotting lasted 2 days. Pt still having discharge, no itching, burning or odor.  ?

## 2021-05-03 NOTE — Progress Notes (Signed)
Patient ID: Victoria Solis, female   DOB: [birth year], 31 y.o.   MRN: [id number] ? ?HPI ?Victoria Solis is a 31 y.o. female.  Spotting and white discharge for 2 days last week.  Denies pelvic pain or dysuria. ?HPI ? ?Past Medical History:  ?Diagnosis Date  ? Migraine   ? ? ?Past Surgical History:  ?Procedure Laterality Date  ? CESAREAN SECTION    ? CESAREAN SECTION N/A 09/19/2018  ? Procedure: CESAREAN SECTION;  Surgeon: Catalina Antigua, MD;  Location: [facility];  Service: Obstetrics;  Laterality: N/A;  ? CESAREAN SECTION WITH BILATERAL TUBAL LIGATION Bilateral 09/19/2018  ? Procedure: CESAREAN SECTION WITH BILATERAL TUBAL LIGATION;  Surgeon: Catalina Antigua, MD;  Location: [facility];  Service: Obstetrics;  Laterality: Bilateral;  ? ? ?Family History  ?Problem Relation Age of Onset  ? Hypertension Mother   ? Diabetes Mother   ? Stroke Mother   ? Kidney failure Mother   ? Hypertension Father   ? Diabetes Father   ? ? ?Social History ?Social History  ? ?Tobacco Use  ? Smoking status: Never  ? Smokeless tobacco: Never  ?Vaping Use  ? Vaping Use: Never used  ?Substance Use Topics  ? Alcohol use: Yes  ?  Comment: occ  ? Drug use: No  ? ? ?Allergies  ?Allergen Reactions  ? Amoxicillin Hives  ? Latex Itching  ? Metronidazole Swelling  ?  Lip swelling / peeling / itching ?  ? Penicillins Hives  ?  Has patient had a PCN reaction causing immediate rash, facial/tongue/throat swelling, SOB or lightheadedness with hypotension: Yes ?Has patient had a PCN reaction causing severe rash involving mucus membranes or skin necrosis: Yes ?Has patient had a PCN reaction that required hospitalization: No ?Has patient had a PCN reaction occurring within the last 10 years: No ?If all of the above answers are "NO", then may proceed with Cephalosporin use. ?  ? ? ?Current Outpatient Medications  ?Medication Sig Dispense Refill  ? diphenhydrAMINE (BENADRYL) 25 MG tablet Take 1 tablet (25 mg total) by mouth every 6 (six) hours as  needed. This medication can cause drowsiness. 30 tablet 0  ? fluconazole (DIFLUCAN) 150 MG tablet Take 1 tablet (150 mg total) by mouth once for 1 dose. 1 tablet 0  ? loratadine (CLARITIN) 10 MG tablet Take 1 tablet (10 mg total) by mouth daily. 30 tablet 11  ? metroNIDAZOLE (METROGEL VAGINAL) 0.75 % vaginal gel Place 1 applicatorful vaginally bid for 5 days, monthly, after the menstrual period is over, for 6 months. 70 g 5  ? clindamycin (CLEOCIN) 300 MG capsule Take 1 capsule (300 mg total) by mouth 2 (two) times daily. For seven days (Patient not taking: Reported on 05/03/2021) 14 capsule 0  ? ?No current facility-administered medications for this visit.  ? ? ?Review of Systems ?Review of Systems ?Constitutional: negative for fatigue and weight loss ?Respiratory: negative for cough and wheezing ?Cardiovascular: negative for chest pain, fatigue and palpitations ?Gastrointestinal: negative for abdominal pain and change in bowel habits ?Genitourinary:negative ?Integument/breast: negative for nipple discharge ?Musculoskeletal:negative for myalgias ?Neurological: negative for gait problems and tremors ?Behavioral/Psych: negative for abusive relationship, depression ?Endocrine: negative for temperature intolerance    ?  ?Blood pressure 109/76, pulse 69, height 5\' 3"  (1.6 m), weight 172 lb 12.8 oz (78.4 kg), last menstrual period 03/20/2021. ? ?Physical Exam ?Physical Exam ?General:   Alert and no distress  ?Skin:   no rash or abnormalities  ?Lungs:   clear  to auscultation bilaterally  ?Heart:   regular rate and rhythm, S1, S2 normal, no murmur, click, rub or gallop  ?Breasts:   Not examined   ?Abdomen:  normal findings: no organomegaly, soft, non-tender and no hernia  ?Pelvis:  External genitalia: normal general appearance ?Urinary system: urethral meatus normal and bladder without fullness, nontender ?Vaginal: normal without tenderness, induration or masses ?Cervix: normal appearance ?Adnexa: normal bimanual  exam ?Uterus: anteverted and non-tender, normal size  ?  ?I have spent a total of 20 minutes of face-to-face time, excluding clinical staff time, reviewing notes and preparing to see patient, ordering tests and/or medications, and counseling the patient.  ? ?Data Reviewed ?Urine Dip and Cultures ?Wet Prep ? ?Assessment  ?   ?1. Vaginal discharge ?Rx: ?- Cervicovaginal ancillary only( [facility]) ? ?2. BV (bacterial vaginosis), chronic ?Rx: ?- metroNIDAZOLE (METROGEL VAGINAL) 0.75 % vaginal gel; Place 1 applicatorful vaginally bid for 5 days, monthly, after the menstrual period is over, for 6 months.  Dispense: 70 g; Refill: 5 ? ?3. Candida vaginitis ?Rx: ?- fluconazole (DIFLUCAN) 150 MG tablet; Take 1 tablet (150 mg total) by mouth once for 1 dose.  Dispense: 1 tablet; Refill: 0 ? ?4. Seasonal allergic rhinitis due to pollen ?Rx: ?- loratadine (CLARITIN) 10 MG tablet; Take 1 tablet (10 mg total) by mouth daily.  Dispense: 30 tablet; Refill: 11  ?  ? ?Plan ?  Follow  up prn ? ? ?Meds ordered this encounter  ?Medications  ? loratadine (CLARITIN) 10 MG tablet  ?  Sig: Take 1 tablet (10 mg total) by mouth daily.  ?  Dispense:  30 tablet  ?  Refill:  11  ? metroNIDAZOLE (METROGEL VAGINAL) 0.75 % vaginal gel  ?  Sig: Place 1 applicatorful vaginally bid for 5 days, monthly, after the menstrual period is over, for 6 months.  ?  Dispense:  70 g  ?  Refill:  5  ? fluconazole (DIFLUCAN) 150 MG tablet  ?  Sig: Take 1 tablet (150 mg total) by mouth once for 1 dose.  ?  Dispense:  1 tablet  ?  Refill:  0  ? ? ? ? Brock Bad, MD ?05/03/2021 2:33 PM  ?

## 2021-05-04 ENCOUNTER — Other Ambulatory Visit: Payer: Self-pay | Admitting: Obstetrics

## 2021-05-04 LAB — CERVICOVAGINAL ANCILLARY ONLY
Bacterial Vaginitis (gardnerella): POSITIVE — AB
Candida Glabrata: NEGATIVE
Candida Vaginitis: NEGATIVE
Chlamydia: NEGATIVE
Comment: NEGATIVE
Comment: NEGATIVE
Comment: NEGATIVE
Comment: NEGATIVE
Comment: NEGATIVE
Comment: NORMAL
Neisseria Gonorrhea: NEGATIVE
Trichomonas: NEGATIVE

## 2021-06-07 ENCOUNTER — Ambulatory Visit: Payer: Medicaid Other

## 2021-06-21 ENCOUNTER — Encounter: Payer: Self-pay | Admitting: Infectious Diseases

## 2021-07-20 ENCOUNTER — Ambulatory Visit (HOSPITAL_COMMUNITY)
Admission: EM | Admit: 2021-07-20 | Discharge: 2021-07-20 | Disposition: A | Discharge status: Home / Self Care | Payer: Medicaid Other | Attending: Physician Assistant | Admitting: Physician Assistant

## 2021-07-20 ENCOUNTER — Encounter (HOSPITAL_COMMUNITY): Payer: Self-pay | Admitting: *Deleted

## 2021-07-20 DIAGNOSIS — H6123 Impacted cerumen, bilateral: Secondary | ICD-10-CM | POA: Diagnosis not present

## 2021-07-20 MED ORDER — CARBAMIDE PEROXIDE 6.5 % OT SOLN: 5.0000 [drp] | Freq: Two times a day (BID) | OTIC | 0 refills | Status: DC

## 2021-07-20 NOTE — ED Triage Notes (Signed)
C/O right earache over past 2 days without any other sxs or fevers.

## 2021-07-20 NOTE — ED Provider Notes (Signed)
[facility]    CSN: [id number] Arrival date & time: 07/20/21  1157      History   Chief Complaint Chief Complaint  Patient presents with   Otalgia    HPI Victoria Solis is a 31 y.o. female.   Patient presents today with a several day history of worsening right otalgia.  Reports pain is rated 7 on a 0-10 pain scale, localized to right ear, described as sharp, no aggravating or alleviating factors identified.  She does report decreased hearing particularly in the right ear.  She has not tried any over-the-counter medication for symptom management.  Denies history of allergies or cerumen impaction.  She denies any recent illness including cough, congestion, fever, nausea, vomiting.  Denies any recent airplane travel or other barotrauma.  Denies any recent swimming.    Past Medical History:  Diagnosis Date   Migraine     Patient Active Problem List   Diagnosis Date Noted   Trichomonal vaginitis 07/21/2019   Hx of chlamydia infection 10/27/2018   Status post tubal ligation 10/27/2018   Abnormal uterine bleeding (AUB) 10/27/2018    Past Surgical History:  Procedure Laterality Date   CESAREAN SECTION     CESAREAN SECTION N/A 09/19/2018   Procedure: CESAREAN SECTION;  Surgeon: Catalina Antigua, MD;  Location: [facility];  Service: Obstetrics;  Laterality: N/A;   CESAREAN SECTION WITH BILATERAL TUBAL LIGATION Bilateral 09/19/2018   Procedure: CESAREAN SECTION WITH BILATERAL TUBAL LIGATION;  Surgeon: Catalina Antigua, MD;  Location: [facility];  Service: Obstetrics;  Laterality: Bilateral;    OB History     Gravida  4   Para  3   Term  3   Preterm      AB      Living  3      SAB      IAB      Ectopic      Multiple  0   Live Births  3            Home Medications    Prior to Admission medications   Medication Sig Start Date End Date Taking? Authorizing Provider  carbamide peroxide (DEBROX) 6.5 % OTIC solution Place 5 drops into both  ears 2 (two) times daily. 07/20/21  Yes Tomie Spizzirri, Noberto Retort, PA-C    Family History Family History  Problem Relation Age of Onset   Hypertension Mother    Diabetes Mother    Stroke Mother    Kidney failure Mother    Hypertension Father    Diabetes Father     Social History Social History   Tobacco Use   Smoking status: Never   Smokeless tobacco: Never  Vaping Use   Vaping Use: Never used  Substance Use Topics   Alcohol use: Not Currently   Drug use: No     Allergies   Amoxicillin, Latex, Metronidazole, and Penicillins   Review of Systems Review of Systems  Constitutional:  Positive for activity change. Negative for appetite change, fatigue and fever.  HENT:  Positive for ear pain and hearing loss. Negative for congestion, sinus pressure, sneezing and sore throat.   Respiratory:  Negative for cough and shortness of breath.   Cardiovascular:  Negative for chest pain.  Gastrointestinal:  Negative for abdominal pain, diarrhea, nausea and vomiting.  Neurological:  Negative for dizziness, light-headedness and headaches.     Physical Exam Triage Vital Signs ED Triage Vitals  Enc Vitals Group     BP 07/20/21  1214 114/71     Pulse Rate 07/20/21 1214 (!) 57     Resp 07/20/21 1214 16     Temp 07/20/21 1214 98.2 F (36.8 C)     Temp Source 07/20/21 1214 Oral     SpO2 07/20/21 1214 96 %     Weight --      Height --      Head Circumference --      Peak Flow --      Pain Score 07/20/21 1215 7     Pain Loc --      Pain Edu? --      Excl. in GC? --    No data found.  Updated Vital Signs BP 114/71   Pulse (!) 57   Temp 98.2 F (36.8 C) (Oral)   Resp 16   LMP 07/16/2021 (Exact Date)   SpO2 96%   Visual Acuity Right Eye Distance:   Left Eye Distance:   Bilateral Distance:    Right Eye Near:   Left Eye Near:    Bilateral Near:     Physical Exam Vitals reviewed.  Constitutional:      General: She is awake. She is not in acute distress.    Appearance: Normal  appearance. She is well-developed. She is not ill-appearing.     Comments: Very pleasant female appears stated age in no acute distress sitting comfortably in exam room  HENT:     Head: Normocephalic and atraumatic.     Right Ear: Tympanic membrane, ear canal and external ear normal. There is impacted cerumen.     Left Ear: Tympanic membrane, ear canal and external ear normal. There is impacted cerumen.     Ears:     Comments: Cerumen impaction noted bilaterally; resolved with irrigation revealing normal TM on left.  Continue to have cerumen impaction on right which was unable to be removed with curette.    Mouth/Throat:     Pharynx: Uvula midline. Posterior oropharyngeal erythema present. No oropharyngeal exudate.  Cardiovascular:     Rate and Rhythm: Normal rate and regular rhythm.     Heart sounds: Normal heart sounds, S1 normal and S2 normal. No murmur heard. Pulmonary:     Effort: Pulmonary effort is normal.     Breath sounds: Normal breath sounds. No wheezing, rhonchi or rales.     Comments: Clear to auscultation bilaterally Psychiatric:        Behavior: Behavior is cooperative.      UC Treatments / Results  Labs (all labs ordered are listed, but only abnormal results are displayed) Labs Reviewed - No data to display  EKG   Radiology No results found.  Procedures Procedures (including critical care time)  Medications Ordered in UC Medications - No data to display  Initial Impression / Assessment and Plan / UC Course  I have reviewed the triage vital signs and the nursing notes.  Pertinent labs & imaging results that were available during my care of the patient were reviewed by me and considered in my medical decision making (see chart for details).     She will impaction resolved on left with irrigation.  Attempted to remove persistent cerumen impaction on right with curette but was only partially successful.  We will have patient go to a box over-the-counter.  If  symptoms or not improving she is to follow-up with ENT and was given contact information with instruction to call to schedule an appointment.  If she has any worsening symptoms she is  to return for reevaluation.  Final Clinical Impressions(s) / UC Diagnoses   Final diagnoses:  Bilateral hearing loss due to cerumen impaction     Discharge Instructions      You have some wax left in your right ear.  Use Debrox.  If is not improving please follow-up with ENT; call to schedule an appointment.  If anything worsens return for reevaluation.     ED Prescriptions     Medication Sig Dispense Auth. Provider   carbamide peroxide (DEBROX) 6.5 % OTIC solution Place 5 drops into both ears 2 (two) times daily. 15 mL Maame Dack K, PA-C      PDMP not reviewed this encounter.   Terrilee Croak, PA-C 07/20/21 1302

## 2021-07-20 NOTE — Discharge Instructions (Signed)
You have some wax left in your right ear.  Use Debrox.  If is not improving please follow-up with ENT; call to schedule an appointment.  If anything worsens return for reevaluation.

## 2021-11-17 ENCOUNTER — Other Ambulatory Visit (HOSPITAL_COMMUNITY)
Admission: RE | Admit: 2021-11-17 | Discharge: 2021-11-17 | Disposition: A | Discharge status: Home / Self Care | Payer: Commercial Managed Care - HMO | Source: Ambulatory Visit | Attending: Obstetrics | Admitting: Obstetrics

## 2021-11-17 ENCOUNTER — Ambulatory Visit (INDEPENDENT_AMBULATORY_CARE_PROVIDER_SITE_OTHER): Payer: Medicaid Other | Admitting: General Practice

## 2021-11-17 VITALS — BP 136/83 | HR 70 | Ht 62.0 in | Wt 179.0 lb

## 2021-11-17 DIAGNOSIS — N898 Other specified noninflammatory disorders of vagina: Secondary | ICD-10-CM | POA: Diagnosis present

## 2021-11-17 NOTE — Progress Notes (Signed)
SUBJECTIVE:  31 y.o. female complains of clear and white vaginal discharge for 2 day(s). Denies abnormal vaginal bleeding or significant pelvic pain or fever. No UTI symptoms. Denies history of known exposure to STD.  No LMP recorded.  OBJECTIVE:  She appears well, afebrile. Urine dipstick: not done.  ASSESSMENT:  Vaginal Discharge  Vaginal Odor   PLAN:  GC, chlamydia, trichomonas, BVAG, CVAG probe sent to lab. Treatment: To be determined once lab results are received ROV prn if symptoms persist or worsen.

## 2021-11-20 LAB — CERVICOVAGINAL ANCILLARY ONLY
Bacterial Vaginitis (gardnerella): POSITIVE — AB
Candida Glabrata: NEGATIVE
Candida Vaginitis: NEGATIVE
Chlamydia: NEGATIVE
Comment: NEGATIVE
Comment: NEGATIVE
Comment: NEGATIVE
Comment: NEGATIVE
Comment: NEGATIVE
Comment: NORMAL
Neisseria Gonorrhea: NEGATIVE
Trichomonas: NEGATIVE

## 2021-11-22 ENCOUNTER — Other Ambulatory Visit: Payer: Self-pay | Admitting: Obstetrics and Gynecology

## 2021-11-22 DIAGNOSIS — B9689 Other specified bacterial agents as the cause of diseases classified elsewhere: Secondary | ICD-10-CM

## 2021-11-22 MED ORDER — CLINDAMYCIN PHOSPHATE 2 % VA CREA: 1.0000 | TOPICAL_CREAM | Freq: Every day | VAGINAL | 0 refills | Status: AC

## 2022-01-25 ENCOUNTER — Ambulatory Visit (INDEPENDENT_AMBULATORY_CARE_PROVIDER_SITE_OTHER): Payer: Commercial Managed Care - HMO | Admitting: General Practice

## 2022-01-25 ENCOUNTER — Other Ambulatory Visit (HOSPITAL_COMMUNITY)
Admission: RE | Admit: 2022-01-25 | Discharge: 2022-01-25 | Disposition: A | Discharge status: Home / Self Care | Payer: Commercial Managed Care - HMO | Source: Ambulatory Visit | Attending: Obstetrics and Gynecology | Admitting: Obstetrics and Gynecology

## 2022-01-25 VITALS — BP 132/83 | HR 65 | Ht 65.0 in | Wt 177.8 lb

## 2022-01-25 DIAGNOSIS — N760 Acute vaginitis: Secondary | ICD-10-CM | POA: Diagnosis present

## 2022-01-25 DIAGNOSIS — B9689 Other specified bacterial agents as the cause of diseases classified elsewhere: Secondary | ICD-10-CM | POA: Diagnosis present

## 2022-01-25 DIAGNOSIS — N898 Other specified noninflammatory disorders of vagina: Secondary | ICD-10-CM

## 2022-01-25 NOTE — Progress Notes (Signed)
SUBJECTIVE:  30 y.o. female complains of vaginal odor for 1 week(s). Denies abnormal vaginal bleeding or significant pelvic pain or fever. No UTI symptoms. Denies history of known exposure to STD.  No LMP recorded.  OBJECTIVE:  She appears well, afebrile. Urine dipstick: not done.  ASSESSMENT:  Vaginal Discharge  Vaginal Odor   PLAN:  GC, chlamydia, trichomonas, BVAG, CVAG probe sent to lab. Treatment: To be determined once lab results are received ROV prn if symptoms persist or worsen.

## 2022-01-25 NOTE — Progress Notes (Signed)
Patient was assessed and managed by nursing staff during this encounter. I have reviewed the chart and agree with the documentation and plan. I have also made any necessary editorial changes.  Victoria Stmarie A Jahmiya Guidotti, MD 01/25/2022 1:17 PM   

## 2022-01-26 ENCOUNTER — Ambulatory Visit (HOSPITAL_COMMUNITY)
Admission: EM | Admit: 2022-01-26 | Discharge: 2022-01-26 | Discharge status: Against Medical Advice | Payer: Medicaid Other

## 2022-01-26 LAB — CERVICOVAGINAL ANCILLARY ONLY
Bacterial Vaginitis (gardnerella): POSITIVE — AB
Candida Glabrata: NEGATIVE
Candida Vaginitis: NEGATIVE
Chlamydia: NEGATIVE
Comment: NEGATIVE
Comment: NEGATIVE
Comment: NEGATIVE
Comment: NEGATIVE
Comment: NEGATIVE
Comment: NORMAL
Neisseria Gonorrhea: NEGATIVE
Trichomonas: NEGATIVE

## 2022-01-26 NOTE — ED Notes (Signed)
No answer from lobby. Was told by person in lobby pt at home now.

## 2022-01-30 ENCOUNTER — Encounter: Payer: Self-pay | Admitting: Obstetrics

## 2022-01-31 ENCOUNTER — Telehealth: Payer: Self-pay | Admitting: Emergency Medicine

## 2022-01-31 ENCOUNTER — Other Ambulatory Visit: Payer: Self-pay | Admitting: Emergency Medicine

## 2022-01-31 DIAGNOSIS — B9689 Other specified bacterial agents as the cause of diseases classified elsewhere: Secondary | ICD-10-CM

## 2022-01-31 MED ORDER — METRONIDAZOLE 0.75 % VA GEL: 1.0000 | Freq: Every day | VAGINAL | 1 refills | Status: DC

## 2022-01-31 NOTE — Telephone Encounter (Signed)
Pt informed of results. Has allergy to oral metronidazole, but reports she can tolerate vaginal metro gel. Rx sent to pharmacy.

## 2022-01-31 NOTE — Telephone Encounter (Signed)
-----   Message from Griffin Basil, MD sent at 01/30/2022  5:50 PM EST ----- Vaginal swab positive for BV, will offer treatment

## 2022-03-05 ENCOUNTER — Ambulatory Visit: Payer: Medicaid Other | Admitting: Obstetrics and Gynecology

## 2022-03-09 ENCOUNTER — Encounter: Payer: Self-pay | Admitting: Obstetrics

## 2022-03-09 ENCOUNTER — Other Ambulatory Visit (HOSPITAL_COMMUNITY)
Admission: RE | Admit: 2022-03-09 | Discharge: 2022-03-09 | Disposition: A | Discharge status: Home / Self Care | Payer: Medicaid Other | Source: Ambulatory Visit | Attending: Family Medicine | Admitting: Family Medicine

## 2022-03-09 ENCOUNTER — Ambulatory Visit (INDEPENDENT_AMBULATORY_CARE_PROVIDER_SITE_OTHER): Payer: Commercial Managed Care - HMO | Admitting: Obstetrics

## 2022-03-09 VITALS — BP 116/78 | HR 65 | Ht 63.0 in | Wt 178.6 lb

## 2022-03-09 DIAGNOSIS — Z01419 Encounter for gynecological examination (general) (routine) without abnormal findings: Secondary | ICD-10-CM

## 2022-03-09 DIAGNOSIS — N898 Other specified noninflammatory disorders of vagina: Secondary | ICD-10-CM

## 2022-03-09 DIAGNOSIS — N939 Abnormal uterine and vaginal bleeding, unspecified: Secondary | ICD-10-CM

## 2022-03-09 DIAGNOSIS — B9689 Other specified bacterial agents as the cause of diseases classified elsewhere: Secondary | ICD-10-CM

## 2022-03-09 DIAGNOSIS — Z113 Encounter for screening for infections with a predominantly sexual mode of transmission: Secondary | ICD-10-CM

## 2022-03-09 NOTE — Progress Notes (Unsigned)
Pt reports AUB and pink/red discharge 1-2 weeks ago. No period since November or December but states periods are irregular. Tubal ligation 2020.

## 2022-03-09 NOTE — Progress Notes (Unsigned)
Subjective:        Victoria Solis is a 32 y.o. female here for a routine exam.  Current complaints: Irregular periods and vaginal discharge.    Personal health questionnaire:  Is patient Ashkenazi Jewish, have a family history of breast and/or ovarian cancer: no Is there a family history of uterine cancer diagnosed at age < 66, gastrointestinal cancer, urinary tract cancer, family member who is a Field seismologist syndrome-associated carrier: no Is the patient overweight and hypertensive, family history of diabetes, personal history of gestational diabetes, preeclampsia or PCOS: yes Is patient over 24, have PCOS,  family history of premature CHD under age 1, diabetes, smoke, have hypertension or peripheral artery disease:  no At any time, has a partner hit, kicked or otherwise hurt or frightened you?: no Over the past 2 weeks, have you felt down, depressed or hopeless?: no Over the past 2 weeks, have you felt little interest or pleasure in doing things?:no   Gynecologic History No LMP recorded. Contraception: tubal ligation Last Pap: 2022. Results were: normal Last mammogram: n/a. Results were: n/a  Obstetric History OB History  Gravida Para Term Preterm AB Living  4 3 3     3  $ SAB IAB Ectopic Multiple Live Births        0 3    # Outcome Date GA Lbr Len/2nd Weight Sex Delivery Anes PTL Lv  4 Gravida           3 Term 07/23/17 [id number] / 00:13 6 lb 14.4 oz (3.13 kg) M VBAC, Vacuum EPI  LIV  2 Term      Vag-Spont   LIV  1 Term      CS-Unspec   LIV    Past Medical History:  Diagnosis Date   Migraine     Past Surgical History:  Procedure Laterality Date   CESAREAN SECTION     CESAREAN SECTION N/A 09/19/2018   Procedure: CESAREAN SECTION;  Surgeon: CMora Bellman MD;  Location: [facility];  Service: Obstetrics;  Laterality: N/A;   CESAREAN SECTION WITH BILATERAL TUBAL LIGATION Bilateral 09/19/2018   Procedure: CESAREAN SECTION WITH BILATERAL TUBAL LIGATION;  Surgeon:  CMora Bellman MD;  [facility];  Service: Obstetrics;  Laterality: Bilateral;     Current Outpatient Medications:    carbamide peroxide (DEBROX) 6.5 % OTIC solution, Place 5 drops into both ears 2 (two) times daily. (Patient not taking: Reported on 11/17/2021), Disp: 15 mL, Rfl: 0   metroNIDAZOLE (METROGEL) 0.75 % vaginal gel, Place 1 Applicatorful vaginally at bedtime. Apply one applicatorful to vagina at bedtime for 5 days (Patient not taking: Reported on 03/09/2022), Disp: 70 g, Rfl: 1 Allergies  Allergen Reactions   Amoxicillin Hives   Latex Itching   Metronidazole Swelling    Lip swelling / peeling / itching    Penicillins Hives    Has patient had a PCN reaction causing immediate rash, facial/tongue/throat swelling, SOB or lightheadedness with hypotension: Yes Has patient had a PCN reaction causing severe rash involving mucus membranes or skin necrosis: Yes Has patient had a PCN reaction that required hospitalization: No Has patient had a PCN reaction occurring within the last 10 years: No If all of the above answers are "NO", then may proceed with Cephalosporin use.     Social History   Tobacco Use   Smoking status: Never   Smokeless tobacco: Never  Substance Use Topics   Alcohol use: Not Currently    Family History  Problem  Relation Age of Onset   Hypertension Mother    Diabetes Mother    Stroke Mother    Kidney failure Mother    Hypertension Father    Diabetes Father       Review of Systems  Constitutional: negative for fatigue and weight loss Respiratory: negative for cough and wheezing Cardiovascular: negative for chest pain, fatigue and palpitations Gastrointestinal: negative for abdominal pain and change in bowel habits Musculoskeletal:negative for myalgias Neurological: negative for gait problems and tremors Behavioral/Psych: negative for abusive relationship, depression Endocrine: negative for temperature intolerance    Genitourinary: positive  for abnormal menstrual periods and vaginal discharge.  Negative for genital lesions, hot flashes, sexual problems Integument/breast: negative for breast lump, breast tenderness, nipple discharge and skin lesion(s)    Objective:       BP 116/78   Pulse 65   Ht 5' 3"$  (1.6 m)   Wt 178 lb 9.6 oz (81 kg)   BMI 31.64 kg/m  General:   Alert and no distress  Skin:   no rash or abnormalities  Lungs:   clear to auscultation bilaterally  Heart:   regular rate and rhythm, S1, S2 normal, no murmur, click, rub or gallop  Breasts:   normal without suspicious masses, skin or nipple changes or axillary nodes  Abdomen:  normal findings: no organomegaly, soft, non-tender and no hernia  Pelvis:  External genitalia: normal general appearance Urinary system: urethral meatus normal and bladder without fullness, nontender Vaginal: normal without tenderness, induration or masses Cervix: normal appearance Adnexa: normal bimanual exam Uterus: anteverted and non-tender, normal size   Lab Review Urine pregnancy test Labs reviewed yes Radiologic studies reviewed yes  I have spent a total of 20 minutes of face-to-face time, excluding clinical staff time, reviewing notes and preparing to see patient, ordering tests and/or medications, and counseling the patient.   Assessment:    1. Encounter for gynecological examination with Papanicolaou smear of cervix Rx: - Cytology - PAP  2. Abnormal uterine bleeding (AUB) Rx: - POCT urine pregnancy  3. Vaginal discharge Rx: - Cervicovaginal ancillary only( [place])  4. Bacterial vaginosis - Flagyl Rx  5. Screen for STD (sexually transmitted disease) Rx: - HIV Antibody (routine testing w rflx) - Hepatitis B surface antigen - RPR - Hepatitis C antibody    Plan:    Education reviewed: calcium supplements, depression evaluation, low fat, low cholesterol diet, safe sex/STD prevention, self breast exams, and weight bearing exercise. Follow up in: 1  year.   No orders of the defined types were placed in this encounter.  Orders Placed This Encounter  Procedures   HIV Antibody (routine testing w rflx)   Hepatitis B surface antigen   RPR   Hepatitis C antibody   POCT urine pregnancy     Shelly Bombard, MD 03/09/2022 9:00 AM

## 2022-03-10 LAB — HIV ANTIBODY (ROUTINE TESTING W REFLEX): HIV Screen 4th Generation wRfx: NONREACTIVE

## 2022-03-10 LAB — HEPATITIS C ANTIBODY: Hep C Virus Ab: NONREACTIVE

## 2022-03-10 LAB — HEPATITIS B SURFACE ANTIGEN: Hepatitis B Surface Ag: NEGATIVE

## 2022-03-10 LAB — RPR: RPR Ser Ql: NONREACTIVE

## 2022-03-12 LAB — CERVICOVAGINAL ANCILLARY ONLY
Bacterial Vaginitis (gardnerella): POSITIVE — AB
Candida Glabrata: NEGATIVE
Candida Vaginitis: NEGATIVE
Chlamydia: NEGATIVE
Comment: NEGATIVE
Comment: NEGATIVE
Comment: NEGATIVE
Comment: NEGATIVE
Comment: NEGATIVE
Comment: NORMAL
Neisseria Gonorrhea: NEGATIVE
Trichomonas: NEGATIVE

## 2022-03-14 ENCOUNTER — Other Ambulatory Visit: Payer: Self-pay | Admitting: Obstetrics

## 2022-03-14 ENCOUNTER — Encounter: Payer: Self-pay | Admitting: Obstetrics

## 2022-03-14 DIAGNOSIS — B9689 Other specified bacterial agents as the cause of diseases classified elsewhere: Secondary | ICD-10-CM

## 2022-03-14 LAB — POCT URINE PREGNANCY: Preg Test, Ur: NEGATIVE

## 2022-03-14 LAB — CYTOLOGY - PAP
Comment: NEGATIVE
Diagnosis: NEGATIVE
High risk HPV: NEGATIVE

## 2022-03-30 ENCOUNTER — Ambulatory Visit (HOSPITAL_COMMUNITY)
Admission: EM | Admit: 2022-03-30 | Discharge: 2022-03-30 | Disposition: A | Discharge status: Home / Self Care | Payer: Medicaid Other

## 2022-03-31 ENCOUNTER — Encounter (HOSPITAL_COMMUNITY): Payer: Self-pay

## 2022-03-31 ENCOUNTER — Ambulatory Visit (HOSPITAL_COMMUNITY)
Admission: EM | Admit: 2022-03-31 | Discharge: 2022-03-31 | Disposition: A | Discharge status: Home / Self Care | Payer: Commercial Managed Care - HMO | Attending: Internal Medicine | Admitting: Internal Medicine

## 2022-03-31 DIAGNOSIS — H109 Unspecified conjunctivitis: Secondary | ICD-10-CM | POA: Diagnosis not present

## 2022-03-31 MED ORDER — TETRACAINE HCL 0.5 % OP SOLN
OPHTHALMIC | Status: AC
  Filled 2022-03-31: qty 4

## 2022-03-31 MED ORDER — MOXIFLOXACIN HCL 0.5 % OP SOLN: 1.0000 [drp] | Freq: Three times a day (TID) | OPHTHALMIC | 0 refills | Status: DC

## 2022-03-31 NOTE — Discharge Instructions (Addendum)
You have bacterial conjunctivitis (pink eye) which is an eye infection.    - Use antibiotic eye medication sent to pharmacy as directed.  - Change your pillowcase after 2 to 3 days to avoid reinfection.  - You may take Tylenol every 6 hours as needed for any pain you may have.  - Avoid scratching your eye.  Wash your hands frequently to avoid spread of infection to others.  Perform warm compresses to your eye before applying the eye medication.  If you wear contacts, do not use contacts for 14 days. Instead, use your eye glasses for vision correction. Follow-up with eye doctor as needed for new or worsening symptoms.   If you develop any new or worsening symptoms or do not improve in the next 2 to 3 days, please return.  If your symptoms are severe, please go to the emergency room.  Follow-up with your primary care provider for further evaluation and management of your symptoms as well as ongoing wellness visits.  I hope you feel better!

## 2022-03-31 NOTE — ED Provider Notes (Signed)
[facility]    CSN: [id number] Arrival date & time: 03/31/22  1002      History   Chief Complaint Chief Complaint  Patient presents with   Eye Problem    HPI Victoria Solis is a 32 y.o. female.   Patient presents urgent care for evaluation of left eye drainage, swelling, and anterior left eye pain that started 2 days ago.  Reports her left eye is very sensitive to light.  Denies blurry vision, decreased visual acuity, and spots in vision.  No headache, fever/chills, viral URI symptoms, or known sick contacts with similar symptoms.  She does wear contacts and took these out 2 days ago since she believes she was developing pinkeye to the left eye.  Denies eye itching or recent trauma/injury to the eye that she is aware of.  Denies history of corneal abrasion to the left eye.  Right eye is asymptomatic.  No recent antibiotic or steroid use.  Drainage from the left eye is clear, thick, and copious.  Left eye is pink/red.  Has not attempted use of any over-the-counter medicines to help with symptoms.   Eye Problem   Past Medical History:  Diagnosis Date   Migraine     Patient Active Problem List   Diagnosis Date Noted   Trichomonal vaginitis 07/21/2019   Hx of chlamydia infection 10/27/2018   Status post tubal ligation 10/27/2018   Abnormal uterine bleeding (AUB) 10/27/2018    Past Surgical History:  Procedure Laterality Date   CESAREAN SECTION     CESAREAN SECTION N/A 09/19/2018   Procedure: CESAREAN SECTION;  Surgeon: Mora Bellman, MD;  Location: [facility];  Service: Obstetrics;  Laterality: N/A;   CESAREAN SECTION WITH BILATERAL TUBAL LIGATION Bilateral 09/19/2018   Procedure: CESAREAN SECTION WITH BILATERAL TUBAL LIGATION;  Surgeon: Mora Bellman, MD;  Location: [facility];  Service: Obstetrics;  Laterality: Bilateral;    OB History     Gravida  4   Para  3   Term  3   Preterm      AB      Living  3      SAB      IAB      Ectopic       Multiple  0   Live Births  3            Home Medications    Prior to Admission medications   Medication Sig Start Date End Date Taking? Authorizing Provider  moxifloxacin (VIGAMOX) 0.5 % ophthalmic solution Place 1 drop into the left eye 3 (three) times daily. Use as directed for 7 days. 03/31/22  Yes Talbot Grumbling, FNP  carbamide peroxide (DEBROX) 6.5 % OTIC solution Place 5 drops into both ears 2 (two) times daily. Patient not taking: Reported on 11/17/2021 07/20/21   Raspet, Junie Panning K, PA-C  metroNIDAZOLE (METROGEL) 0.75 % vaginal gel Place 1 Applicatorful vaginally at bedtime. Apply one applicatorful to vagina at bedtime for 5 days Patient not taking: Reported on 03/09/2022 01/31/22   Griffin Basil, MD    Family History Family History  Problem Relation Age of Onset   Hypertension Mother    Diabetes Mother    Stroke Mother    Kidney failure Mother    Hypertension Father    Diabetes Father     Social History Social History   Tobacco Use   Smoking status: Never   Smokeless tobacco: Never  Vaping Use   Vaping Use: Never  used  Substance Use Topics   Alcohol use: Not Currently   Drug use: No     Allergies   Amoxicillin, Latex, Metronidazole, and Penicillins   Review of Systems Review of Systems Per HPI  Physical Exam Triage Vital Signs ED Triage Vitals  Enc Vitals Group     BP 03/31/22 1016 (!) 153/75     Pulse Rate 03/31/22 1016 61     Resp 03/31/22 1016 16     Temp 03/31/22 1016 98.2 F (36.8 C)     Temp Source 03/31/22 1016 Oral     SpO2 03/31/22 1016 96 %     Weight 03/31/22 1014 170 lb (77.1 kg)     Height --      Head Circumference --      Peak Flow --      Pain Score 03/31/22 1014 6     Pain Loc --      Pain Edu? --      Excl. in [place]? --    No data found.  Updated Vital Signs BP (!) 153/75 (BP Location: Right Arm)   Pulse 61   Temp 98.2 F (36.8 C) (Oral)   Resp 16   Wt 170 lb (77.1 kg)   LMP 02/23/2022 (Approximate) Comment:  irrigular  SpO2 96%   BMI 30.11 kg/m   Visual Acuity Right Eye Distance:   Left Eye Distance:   Bilateral Distance:    Right Eye Near:   Left Eye Near:    Bilateral Near:     Physical Exam Vitals and nursing note reviewed.  Constitutional:      Appearance: She is not ill-appearing or toxic-appearing.  HENT:     Head: Normocephalic and atraumatic.     Right Ear: Hearing and external ear normal.     Left Ear: Hearing and external ear normal.     Nose: Nose normal.     Mouth/Throat:     Lips: Pink.  Eyes:     General: Lids are normal. Vision grossly intact. Gaze aligned appropriately.        Right eye: No foreign body, discharge or hordeolum.        Left eye: Discharge present.No foreign body or hordeolum.     Extraocular Movements: Extraocular movements intact.     Conjunctiva/sclera:     Right eye: Right conjunctiva is not injected. No chemosis, exudate or hemorrhage.    Left eye: Left conjunctiva is injected. No chemosis, exudate or hemorrhage.    Pupils: Pupils are equal, round, and reactive to light.     Left eye: No corneal abrasion or fluorescein uptake.     Slit lamp exam:    Left eye: No corneal flare, corneal ulcer, foreign body or hyphema.     Comments: EOMs intact without pain or dizziness elicited.  Clear discharge noted to the left eye.  Cardiovascular:     Rate and Rhythm: Normal rate and regular rhythm.     Heart sounds: Normal heart sounds, S1 normal and S2 normal.  Pulmonary:     Effort: Pulmonary effort is normal. No respiratory distress.     Breath sounds: Normal breath sounds and air entry.  Musculoskeletal:     Cervical back: Neck supple.  Skin:    General: Skin is warm and dry.     Capillary Refill: Capillary refill takes less than 2 seconds.     Findings: No rash.  Neurological:     General: No focal deficit present.  Mental Status: She is alert and oriented to person, place, and time. Mental status is at baseline.     Cranial Nerves: No  dysarthria or facial asymmetry.  Psychiatric:        Mood and Affect: Mood normal.        Speech: Speech normal.        Behavior: Behavior normal.        Thought Content: Thought content normal.        Judgment: Judgment normal.      UC Treatments / Results  Labs (all labs ordered are listed, but only abnormal results are displayed) Labs Reviewed - No data to display  EKG   Radiology No results found.  Procedures Procedures (including critical care time)  Medications Ordered in UC Medications - No data to display  Initial Impression / Assessment and Plan / UC Course  I have reviewed the triage vital signs and the nursing notes.  Pertinent labs & imaging results that were available during my care of the patient were reviewed by me and considered in my medical decision making (see chart for details).   1. Bacterial conjunctivitis of left eye Fluorescein stain is negative for hyphema and corneal abrasion.  Presentation is consistent with acute bacterial conjunctivitis of the left eye.  Patient is a contact lens wearer, therefore and moxifloxacin sent to pharmacy to be taken as directed. Advised to wear her glasses and not her contacts for the next 2 weeks while she recovers from arterial conjunctivitis.  Advised patient to change pillowcase after 2 to 3 days of antibiotics to avoid reinfection.  Tylenol may be used every 6 hours as needed for pain and discomfort.  Advised patient to avoid scratching the eye and wash hands frequently.  Warm compresses to be used prior to administration of eyedrops to reduce inflammation and encourage drainage of infected material from the eye.  Follow-up with urgent care as needed if symptoms do not improve in the next 24 to 48 hours with antibiotic eyedrops or if patient develops blurry vision/worsening visual acuity.   Discussed physical exam and available lab work findings in clinic with patient.  Counseled patient regarding appropriate use of  medications and potential side effects for all medications recommended or prescribed today. Discussed red flag signs and symptoms of worsening condition,when to call the PCP office, return to urgent care, and when to seek higher level of care in the emergency department. Patient verbalizes understanding and agreement with plan. All questions answered. Patient discharged in stable condition.    Final Clinical Impressions(s) / UC Diagnoses   Final diagnoses:  Bacterial conjunctivitis of left eye     Discharge Instructions      You have bacterial conjunctivitis (pink eye) which is an eye infection.    - Use antibiotic eye medication sent to pharmacy as directed.  - Change your pillowcase after 2 to 3 days to avoid reinfection.  - You may take Tylenol every 6 hours as needed for any pain you may have.  - Avoid scratching your eye.  Wash your hands frequently to avoid spread of infection to others.  Perform warm compresses to your eye before applying the eye medication.  If you wear contacts, do not use contacts for 14 days. Instead, use your eye glasses for vision correction. Follow-up with eye doctor as needed for new or worsening symptoms.   If you develop any new or worsening symptoms or do not improve in the next 2 to 3 days, please  return.  If your symptoms are severe, please go to the emergency room.  Follow-up with your primary care provider for further evaluation and management of your symptoms as well as ongoing wellness visits.  I hope you feel better!   ED Prescriptions     Medication Sig Dispense Auth. Provider   moxifloxacin (VIGAMOX) 0.5 % ophthalmic solution Place 1 drop into the left eye 3 (three) times daily. Use as directed for 7 days. 3 mL Talbot Grumbling, FNP      PDMP not reviewed this encounter.   Talbot Grumbling, [place] 03/31/22 1046

## 2022-03-31 NOTE — ED Triage Notes (Signed)
Pt states that her left eye has been draining for 3 days. Unsure if its from contacts. Swollen and drainage. Sensitive to light.

## 2022-06-05 ENCOUNTER — Other Ambulatory Visit: Payer: Self-pay

## 2022-06-05 ENCOUNTER — Encounter (HOSPITAL_COMMUNITY): Payer: Self-pay

## 2022-06-05 ENCOUNTER — Emergency Department (HOSPITAL_COMMUNITY)
Admission: EM | Admit: 2022-06-05 | Discharge: 2022-06-05 | Disposition: A | Discharge status: Home / Self Care | Payer: Medicaid Other | Attending: Emergency Medicine | Admitting: Emergency Medicine

## 2022-06-05 DIAGNOSIS — R55 Syncope and collapse: Secondary | ICD-10-CM | POA: Diagnosis not present

## 2022-06-05 DIAGNOSIS — Z91040 Latex allergy status: Secondary | ICD-10-CM | POA: Diagnosis not present

## 2022-06-05 DIAGNOSIS — L509 Urticaria, unspecified: Secondary | ICD-10-CM | POA: Diagnosis not present

## 2022-06-05 DIAGNOSIS — R7401 Elevation of levels of liver transaminase levels: Secondary | ICD-10-CM | POA: Diagnosis not present

## 2022-06-05 DIAGNOSIS — R7402 Elevation of levels of lactic acid dehydrogenase (LDH): Secondary | ICD-10-CM | POA: Insufficient documentation

## 2022-06-05 DIAGNOSIS — R7309 Other abnormal glucose: Secondary | ICD-10-CM | POA: Diagnosis not present

## 2022-06-05 DIAGNOSIS — E871 Hypo-osmolality and hyponatremia: Secondary | ICD-10-CM | POA: Diagnosis not present

## 2022-06-05 DIAGNOSIS — R7989 Other specified abnormal findings of blood chemistry: Secondary | ICD-10-CM

## 2022-06-05 LAB — CBC WITH DIFFERENTIAL/PLATELET
Abs Immature Granulocytes: 0.07 10*3/uL (ref 0.00–0.07)
Basophils Absolute: 0 10*3/uL (ref 0.0–0.1)
Basophils Relative: 0 %
Eosinophils Absolute: 0 10*3/uL (ref 0.0–0.5)
Eosinophils Relative: 0 %
HCT: 40.4 % (ref 36.0–46.0)
Hemoglobin: 13.7 g/dL (ref 12.0–15.0)
Immature Granulocytes: 1 %
Lymphocytes Relative: 12 %
Lymphs Abs: 1.8 10*3/uL (ref 0.7–4.0)
MCH: 30.8 pg (ref 26.0–34.0)
MCHC: 33.9 g/dL (ref 30.0–36.0)
MCV: 90.8 fL (ref 80.0–100.0)
Monocytes Absolute: 0.8 10*3/uL (ref 0.1–1.0)
Monocytes Relative: 6 %
Neutro Abs: 12.5 10*3/uL — ABNORMAL HIGH (ref 1.7–7.7)
Neutrophils Relative %: 81 %
Platelets: 223 10*3/uL (ref 150–400)
RBC: 4.45 MIL/uL (ref 3.87–5.11)
RDW: 12.2 % (ref 11.5–15.5)
WBC: 15.2 10*3/uL — ABNORMAL HIGH (ref 4.0–10.5)
nRBC: 0 % (ref 0.0–0.2)

## 2022-06-05 LAB — COMPREHENSIVE METABOLIC PANEL
ALT: 26 U/L (ref 0–44)
AST: 54 U/L — ABNORMAL HIGH (ref 15–41)
Albumin: 4.6 g/dL (ref 3.5–5.0)
Alkaline Phosphatase: 27 U/L — ABNORMAL LOW (ref 38–126)
Anion gap: 9 (ref 5–15)
BUN: 10 mg/dL (ref 6–20)
CO2: 24 mmol/L (ref 22–32)
Calcium: 8.6 mg/dL — ABNORMAL LOW (ref 8.9–10.3)
Chloride: 100 mmol/L (ref 98–111)
Creatinine, Ser: 1 mg/dL (ref 0.44–1.00)
GFR, Estimated: >60 mL/min (ref >60)
Glucose, Bld: 151 mg/dL — ABNORMAL HIGH (ref 70–99)
Potassium: 3.7 mmol/L (ref 3.5–5.1)
Sodium: 133 mmol/L — ABNORMAL LOW (ref 135–145)
Total Bilirubin: 0.9 mg/dL (ref 0.3–1.2)
Total Protein: 7.4 g/dL (ref 6.5–8.1)

## 2022-06-05 LAB — LACTIC ACID, PLASMA
Lactic Acid, Venous: 2.2 mmol/L (ref 0.5–1.9)
Lactic Acid, Venous: 2 mmol/L (ref 0.5–1.9)

## 2022-06-05 LAB — HCG, QUANTITATIVE, PREGNANCY: hCG, Beta Chain, Quant, S: <1 m[IU]/mL (ref <5)

## 2022-06-05 MED ORDER — EPINEPHRINE 0.3 MG/0.3ML IJ SOAJ: 0.3000 mg | INTRAMUSCULAR | 0 refills | Status: DC | PRN

## 2022-06-05 MED ORDER — PREDNISONE 20 MG PO TABS
60.0000 mg | ORAL_TABLET | Freq: Once | ORAL | Status: AC
  Administered 2022-06-05: 60 mg via ORAL
  Filled 2022-06-05: qty 3

## 2022-06-05 MED ORDER — LACTATED RINGERS IV BOLUS
1000.0000 mL | Freq: Once | INTRAVENOUS | Status: AC
  Administered 2022-06-05: 1000 mL via INTRAVENOUS

## 2022-06-05 MED ORDER — DIPHENHYDRAMINE HCL 50 MG/ML IJ SOLN
25.0000 mg | Freq: Once | INTRAMUSCULAR | Status: AC
  Administered 2022-06-05: 25 mg via INTRAVENOUS
  Filled 2022-06-05: qty 1

## 2022-06-05 MED ORDER — SODIUM CHLORIDE 0.9 % IV BOLUS
1000.0000 mL | Freq: Once | INTRAVENOUS | Status: AC
  Administered 2022-06-05: 1000 mL via INTRAVENOUS

## 2022-06-05 MED ORDER — PREDNISONE 50 MG PO TABS: 50.0000 mg | ORAL_TABLET | Freq: Every day | ORAL | 0 refills | Status: DC

## 2022-06-05 NOTE — Discharge Instructions (Addendum)
Take diphenhydramine as needed for itching.  Drink plenty of fluids for the next 2-3 days.  Use the epinephrine autoinjector if you break out in hives like which you had last night and started feeling dizzy or lightheaded.  If you use the autoinjector, you need to come to the emergency department immediately after injecting yourself.

## 2022-06-05 NOTE — ED Triage Notes (Signed)
Patient reports hives starting tonight all over her body and then going away, patient also reports feeling weak/lightheaded. Patient called her brother to take her here and patient had syncopal episode. Brother reports patient has been working 3rd shift and hasn't been sleeping much. Patient endorses working all night and getting off at 8am and has not been to sleep since then. Patient falling asleep in triage chair, but will awaken to voice.

## 2022-06-05 NOTE — ED Notes (Signed)
US PIV placed. 

## 2022-06-05 NOTE — ED Provider Notes (Signed)
[place] EMERGENCY DEPARTMENT AT [facility] Provider Note   CSN: [id number] Arrival date & time: 06/05/22  0047     History  Chief Complaint  Patient presents with   Loss of Consciousness    Victoria Solis is a 32 y.o. female.  The history is provided by the patient.  Loss of Consciousness She has history of migraine and comes in because of hives and syncope.  At about 11 PM, she developed generalized hives with itching.  She denies any unusual exposures and has not had problems with hives in the past.  She did not take any medication.  However, she did have a syncopal episode following this.  She feels very weak and continues to feel dizzy.  Hives have spontaneously improved but are not completely gone.  She still is having some mild itching.  She denies chest pain, heaviness, tightness, pressure.   Home Medications Prior to Admission medications   Medication Sig Start Date End Date Taking? Authorizing Provider  carbamide peroxide (DEBROX) 6.5 % OTIC solution Place 5 drops into both ears 2 (two) times daily. Patient not taking: Reported on 11/17/2021 07/20/21   Raspet, Denny Peon K, PA-C  metroNIDAZOLE (METROGEL) 0.75 % vaginal gel Place 1 Applicatorful vaginally at bedtime. Apply one applicatorful to vagina at bedtime for 5 days Patient not taking: Reported on 03/09/2022 01/31/22   Warden Fillers, MD  moxifloxacin (VIGAMOX) 0.5 % ophthalmic solution Place 1 drop into the left eye 3 (three) times daily. Use as directed for 7 days. 03/31/22   Carlisle Beers, FNP      Allergies    Amoxicillin, Latex, Metronidazole, and Penicillins    Review of Systems   Review of Systems  Cardiovascular:  Positive for syncope.  All other systems reviewed and are negative.   Physical Exam Updated Vital Signs BP (!) 76/65 (BP Location: Right Arm)   Pulse 62   Temp 98 F (36.7 C) (Oral)   Resp 20   SpO2 98%  Physical Exam Vitals and nursing note reviewed.   32 year old  female, resting comfortably and in no acute distress. Vital signs are significant for low blood pressure. Oxygen saturation is 98%, which is normal. Head is normocephalic and atraumatic. PERRLA, EOMI. Oropharynx is clear. Neck is nontender and supple without adenopathy or JVD. Back is nontender and there is no CVA tenderness. Lungs are clear without rales, wheezes, or rhonchi. Chest is nontender. Heart has regular rate and rhythm without murmur. Abdomen is soft, flat, nontender without masses or hepatosplenomegaly and peristalsis is normoactive. Extremities have no cyanosis or edema, full range of motion is present. Skin is warm and dry.  Minimal urticarial rashes seen on the arms and legs. Neurologic: Mental status is normal, cranial nerves are intact, moves all extremities equally.  ED Results / Procedures / Treatments   Labs (all labs ordered are listed, but only abnormal results are displayed) Labs Reviewed  COMPREHENSIVE METABOLIC PANEL - Abnormal; Notable for the following components:      Result Value   Sodium 133 (*)    Glucose, Bld 151 (*)    Calcium 8.6 (*)    AST 54 (*)    Alkaline Phosphatase 27 (*)    All other components within normal limits  CBC WITH DIFFERENTIAL/PLATELET - Abnormal; Notable for the following components:   WBC 15.2 (*)    Neutro Abs 12.5 (*)    All other components within normal limits  LACTIC ACID, PLASMA - Abnormal;  Notable for the following components:   Lactic Acid, Venous 2.2 (*)    All other components within normal limits  LACTIC ACID, PLASMA - Abnormal; Notable for the following components:   Lactic Acid, Venous 2.0 (*)    All other components within normal limits  HCG, QUANTITATIVE, PREGNANCY    EKG EKG Interpretation  Date/Time:  Tuesday Jun 05 2022 01:16:59 EDT Ventricular Rate:  59 PR Interval:  182 QRS Duration: 88 QT Interval:  365 QTC Calculation: 362 R Axis:   54 Text Interpretation: Sinus rhythm Borderline T  abnormalities, anterior leads No old tracing to compare Confirmed by Dione Booze ([id number]) on 06/05/2022 1:20:41 AM  Radiology No results found.  Procedures Procedures  Cardiac monitor shows normal sinus rhythm, per my interpretation.  Medications Ordered in ED Medications  sodium chloride 0.9 % bolus 1,000 mL (0 mLs Intravenous Stopped 06/05/22 0300)  diphenhydrAMINE (BENADRYL) injection 25 mg (25 mg Intravenous Given 06/05/22 0216)  lactated ringers bolus 1,000 mL (0 mLs Intravenous Stopped 06/05/22 0555)  predniSONE (DELTASONE) tablet 60 mg (60 mg Oral Given 06/05/22 0557)    ED Course/ Medical Decision Making/ A&P                             Medical Decision Making Amount and/or Complexity of Data Reviewed Labs: ordered.  Risk Prescription drug management.   Urticaria which occurred spontaneously and is resolving spontaneously.  Hypotension which appears to be related to her urticaria and is likely the cause of her syncope.  No evidence of cardiac arrhythmia or intrinsic cardiac disease.  I reviewed and interpreted her electrocardiogram, my interpretation is borderline T wave flattening but otherwise normal ECG.  Repeat blood pressure has improved.  I have ordered IV fluids and laboratory workup of CBC, comprehensive metabolic panel, lactic acid level.  I have ordered a dose of intravenous diphenhydramine.  With blood pressure improving spontaneously and I have spent resolving spontaneously, I do not feel epinephrine is indicated at this point.  I have reviewed her past records, and I see no prior visits for urticaria, blood pressure tends to run in the 110-120 range systolic.  I have reviewed and interpreted her laboratory test, and my interpretation is mild leukocytosis which is nonspecific, mild hyponatremia which is not felt to be clinically significant, elevated random glucose level which is likely aggravated by physiologic stress, mild elevation of AST which is not felt to be clinically  significant.  Patient feels better following above-noted treatment and blood pressure has stabilized above 100.  Initial lactic acid level is mildly elevated.  This is felt to be from hypotension and not sepsis.  I have ordered additional IV fluids.  Blood pressures remained stable.  She is feeling much better and no longer feels dizzy and no longer is having any itching.  Orthostatic vital signs showed no significant change in pulse or blood pressure.  I feel she is safe for discharge.  I am ordering an initial dose of prednisone discharging her with a prescription for a 5-day course of prednisone and I have also given her prescription for epinephrine autoinjector to use as needed.  I have advised her to drink plenty of fluids and take diphenhydramine as needed.  Final Clinical Impression(s) / ED Diagnoses Final diagnoses:  Hypotensive syncope  Urticaria  Hyponatremia  Elevated random blood glucose level  Elevated AST (SGOT)  Elevated lactic acid level    Rx / DC Orders ED  Discharge Orders          Ordered    EPINEPHrine 0.3 mg/0.3 mL IJ SOAJ injection  As needed        06/05/22 0639    predniSONE (DELTASONE) 50 MG tablet  Daily        06/05/22 0639              Dione Booze, MD 06/05/22 [phone]

## 2022-08-17 ENCOUNTER — Ambulatory Visit: Payer: Medicaid Other

## 2022-08-29 ENCOUNTER — Ambulatory Visit (INDEPENDENT_AMBULATORY_CARE_PROVIDER_SITE_OTHER): Payer: Medicaid Other | Admitting: Emergency Medicine

## 2022-08-29 ENCOUNTER — Other Ambulatory Visit (HOSPITAL_COMMUNITY)
Admission: RE | Admit: 2022-08-29 | Discharge: 2022-08-29 | Disposition: A | Discharge status: Home / Self Care | Payer: Medicaid Other | Source: Ambulatory Visit | Attending: Obstetrics and Gynecology | Admitting: Obstetrics and Gynecology

## 2022-08-29 VITALS — BP 105/64 | HR 65 | Ht 64.0 in | Wt 176.5 lb

## 2022-08-29 DIAGNOSIS — N898 Other specified noninflammatory disorders of vagina: Secondary | ICD-10-CM

## 2022-08-29 NOTE — Progress Notes (Signed)
SUBJECTIVE:  32 y.o. female complains of white and odorless vaginal discharge for 2 week(s). Denies abnormal vaginal bleeding or significant pelvic pain or fever. No UTI symptoms. Denies history of known exposure to STD.  No LMP recorded.  OBJECTIVE:  She appears well, afebrile. Urine dipstick: not done.  ASSESSMENT:  Vaginal Discharge  Vaginal Odor   PLAN:  GC, chlamydia, trichomonas, BVAG, CVAG probe sent to lab. Treatment: To be determined once lab results are received ROV prn if symptoms persist or worsen.

## 2022-08-30 MED ORDER — CLINDAMYCIN HCL 300 MG PO CAPS: 300.0000 mg | ORAL_CAPSULE | Freq: Two times a day (BID) | ORAL | 0 refills | Status: AC

## 2022-08-30 NOTE — Addendum Note (Signed)
Addended by: Harvie Bridge on: 08/30/2022 05:58 PM   Modules accepted: Orders

## 2022-10-11 ENCOUNTER — Ambulatory Visit: Payer: Medicaid Other | Admitting: Emergency Medicine

## 2022-10-11 ENCOUNTER — Other Ambulatory Visit (HOSPITAL_COMMUNITY)
Admission: RE | Admit: 2022-10-11 | Discharge: 2022-10-11 | Disposition: A | Discharge status: Home / Self Care | Payer: Medicaid Other | Source: Ambulatory Visit | Attending: Obstetrics and Gynecology | Admitting: Obstetrics and Gynecology

## 2022-10-11 VITALS — BP 135/83 | HR 64 | Ht 64.0 in | Wt 174.6 lb

## 2022-10-11 DIAGNOSIS — Z113 Encounter for screening for infections with a predominantly sexual mode of transmission: Secondary | ICD-10-CM | POA: Insufficient documentation

## 2022-10-11 NOTE — Progress Notes (Signed)
SUBJECTIVE:  32 y.o. female desires routine STD screen. Denies abnormal vaginal discharge, odor, bleeding or significant pelvic pain or fever. No UTI symptoms. Denies history of known exposure to STD.  No LMP recorded.  OBJECTIVE:  She appears well, afebrile. Urine dipstick: not done.  ASSESSMENT:  Vaginal Discharge  Vaginal Odor   PLAN:  GC, chlamydia, trichomonas, BVAG, CVAG probe sent to lab. Treatment: To be determined once lab results are received ROV prn if symptoms persist or worsen.

## 2022-10-15 LAB — CERVICOVAGINAL ANCILLARY ONLY
Bacterial Vaginitis (gardnerella): POSITIVE — AB
Candida Glabrata: NEGATIVE
Candida Vaginitis: NEGATIVE
Chlamydia: NEGATIVE
Comment: NEGATIVE
Comment: NEGATIVE
Comment: NEGATIVE
Comment: NEGATIVE
Comment: NEGATIVE
Comment: NORMAL
Neisseria Gonorrhea: NEGATIVE
Trichomonas: NEGATIVE

## 2022-10-18 ENCOUNTER — Other Ambulatory Visit: Payer: Self-pay

## 2022-10-18 DIAGNOSIS — N760 Acute vaginitis: Secondary | ICD-10-CM

## 2022-10-18 MED ORDER — CLINDAMYCIN HCL 300 MG PO CAPS
300.0000 mg | ORAL_CAPSULE | Freq: Two times a day (BID) | ORAL | 0 refills | Status: DC
Start: 2022-10-18 — End: 2023-02-07

## 2022-11-14 ENCOUNTER — Ambulatory Visit: Payer: Medicaid Other

## 2022-11-14 ENCOUNTER — Other Ambulatory Visit (HOSPITAL_COMMUNITY)
Admission: RE | Admit: 2022-11-14 | Discharge: 2022-11-14 | Disposition: A | Discharge status: Home / Self Care | Payer: Medicaid Other | Source: Ambulatory Visit | Attending: Obstetrics and Gynecology | Admitting: Obstetrics and Gynecology

## 2022-11-14 VITALS — BP 107/73 | HR 61 | Wt 179.2 lb

## 2022-11-14 DIAGNOSIS — N898 Other specified noninflammatory disorders of vagina: Secondary | ICD-10-CM | POA: Insufficient documentation

## 2022-11-14 NOTE — Progress Notes (Signed)
..  SUBJECTIVE:  32 y.o. female complains of white vaginal discharge for 1 day(s). Pt states that she recently finished rx for Clindamycin on Sunday. Denies abnormal vaginal bleeding or significant pelvic pain or fever. No UTI symptoms. Denies history of known exposure to STD.  No LMP recorded. (Menstrual status: Irregular Periods).  OBJECTIVE:  She appears well, afebrile. Urine dipstick: not done.  ASSESSMENT:  Vaginal Discharge    PLAN:  GC, chlamydia, trichomonas, BVAG, CVAG probe sent to lab. Treatment: To be determined once lab results are received ROV prn if symptoms persist or worsen.

## 2022-11-15 LAB — CERVICOVAGINAL ANCILLARY ONLY
Bacterial Vaginitis (gardnerella): NEGATIVE
Candida Glabrata: NEGATIVE
Candida Vaginitis: NEGATIVE
Chlamydia: NEGATIVE
Comment: NEGATIVE
Comment: NEGATIVE
Comment: NEGATIVE
Comment: NEGATIVE
Comment: NEGATIVE
Comment: NORMAL
Neisseria Gonorrhea: NEGATIVE
Trichomonas: NEGATIVE

## 2022-12-26 ENCOUNTER — Other Ambulatory Visit (HOSPITAL_COMMUNITY)
Admission: RE | Admit: 2022-12-26 | Discharge: 2022-12-26 | Disposition: A | Discharge status: Home / Self Care | Payer: Medicaid Other | Source: Ambulatory Visit | Attending: Obstetrics | Admitting: Obstetrics

## 2022-12-26 ENCOUNTER — Ambulatory Visit (INDEPENDENT_AMBULATORY_CARE_PROVIDER_SITE_OTHER): Payer: Medicaid Other | Admitting: *Deleted

## 2022-12-26 VITALS — BP 124/85 | HR 61

## 2022-12-26 DIAGNOSIS — N898 Other specified noninflammatory disorders of vagina: Secondary | ICD-10-CM

## 2022-12-26 NOTE — Progress Notes (Signed)
SUBJECTIVE:  32 y.o. female complains of vaginal irritation for several days following menstrual cycle. Denies abnormal vaginal bleeding or significant pelvic pain or fever. No UTI symptoms. Denies history of known exposure to STD.  No LMP recorded. (Menstrual status: Irregular Periods).  OBJECTIVE:  She appears well, afebrile. Urine dipstick: not done.  ASSESSMENT:  Vaginal Discharge  Vaginal Odor   PLAN:  GC, chlamydia, trichomonas, BVAG, CVAG probe sent to lab. Treatment: To be determined once lab results are received ROV prn if symptoms persist or worsen.

## 2022-12-28 LAB — CERVICOVAGINAL ANCILLARY ONLY
Bacterial Vaginitis (gardnerella): POSITIVE — AB
Candida Glabrata: NEGATIVE
Candida Vaginitis: NEGATIVE
Chlamydia: NEGATIVE
Comment: NEGATIVE
Comment: NEGATIVE
Comment: NEGATIVE
Comment: NEGATIVE
Comment: NEGATIVE
Comment: NORMAL
Neisseria Gonorrhea: NEGATIVE
Trichomonas: NEGATIVE

## 2022-12-31 ENCOUNTER — Other Ambulatory Visit: Payer: Self-pay | Admitting: Family Medicine

## 2022-12-31 MED ORDER — CLINDESSE 2 % VA CREA: 1.0000 | TOPICAL_CREAM | Freq: Every day | VAGINAL | 0 refills | Status: DC

## 2023-02-07 ENCOUNTER — Ambulatory Visit (INDEPENDENT_AMBULATORY_CARE_PROVIDER_SITE_OTHER): Payer: Medicaid Other | Admitting: Obstetrics and Gynecology

## 2023-02-07 ENCOUNTER — Encounter: Payer: Self-pay | Admitting: Obstetrics and Gynecology

## 2023-02-07 VITALS — BP 110/68 | HR 51 | Wt 180.0 lb

## 2023-02-07 DIAGNOSIS — N9089 Other specified noninflammatory disorders of vulva and perineum: Secondary | ICD-10-CM | POA: Diagnosis not present

## 2023-02-07 NOTE — Progress Notes (Signed)
 Pt presents for GYN visit. Has a rash on vagina that began on Monday, noticed a bump appear yesterday. Pt reports no itching or drainage. No recent changes to any cleaning products for showering or laundry. Declines STD testing.

## 2023-02-07 NOTE — Progress Notes (Signed)
  CC: vulvar bump Subjective:    Patient ID: Corean LOISE Macadam, female    DOB: 01-Jun-1990, 33 y.o.   MRN: [id number]  HPI 33yo G4P3 seen for a lesion on her right vulva.  Lesion present 2-3 days and appears to have started to resolve.   Review of Systems     Objective:   Physical Exam Vitals:   02/07/23 1424  BP: 110/68  Pulse: (!) 51  SVE: small 1 cm palpable but soft lesion on the right vulva c/w healing folliculitis.  No other rash or lesion detected.       Assessment & Plan:   1. Vulvar irritation (Primary) Lesion c/w with healed folliculitis/boil, no further management needed    Jerilynn DELENA Buddle, MD Faculty Attending, [facility]

## 2023-03-14 ENCOUNTER — Other Ambulatory Visit (HOSPITAL_COMMUNITY)
Admission: RE | Admit: 2023-03-14 | Discharge: 2023-03-14 | Disposition: A | Discharge status: Home / Self Care | Payer: Medicaid Other | Source: Ambulatory Visit | Attending: Obstetrics and Gynecology | Admitting: Obstetrics and Gynecology

## 2023-03-14 ENCOUNTER — Ambulatory Visit: Payer: Medicaid Other

## 2023-03-14 VITALS — BP 118/75 | HR 57 | Wt 176.6 lb

## 2023-03-14 DIAGNOSIS — N898 Other specified noninflammatory disorders of vagina: Secondary | ICD-10-CM | POA: Diagnosis present

## 2023-03-14 LAB — POCT URINALYSIS DIPSTICK

## 2023-03-14 NOTE — Progress Notes (Signed)
SUBJECTIVE:  33 y.o. female complains of white vaginal discharge for 3 day(s). Denies abnormal vaginal bleeding or significant pelvic pain or fever. No UTI symptoms. Denies history of known exposure to STD.  No LMP recorded. (Menstrual status: Irregular Periods).  OBJECTIVE:  She appears well, afebrile. Urine dipstick: positive for leukocytes.  ASSESSMENT:  Vaginal Discharge  Vaginal Odor   PLAN:  GC, chlamydia, trichomonas, BVAG, CVAG probe sent to lab. Treatment: To be determined once lab results are received ROV prn if symptoms persist or worsen.

## 2023-03-15 LAB — CERVICOVAGINAL ANCILLARY ONLY
Bacterial Vaginitis (gardnerella): POSITIVE — AB
Candida Glabrata: NEGATIVE
Candida Vaginitis: NEGATIVE
Chlamydia: NEGATIVE
Comment: NEGATIVE
Comment: NEGATIVE
Comment: NEGATIVE
Comment: NEGATIVE
Comment: NEGATIVE
Comment: NORMAL
Neisseria Gonorrhea: NEGATIVE
Trichomonas: NEGATIVE

## 2023-03-16 LAB — URINE CULTURE

## 2023-03-18 ENCOUNTER — Encounter: Payer: Self-pay | Admitting: Obstetrics and Gynecology

## 2023-03-18 MED ORDER — CLINDAMYCIN HCL 300 MG PO CAPS: 300.0000 mg | ORAL_CAPSULE | Freq: Two times a day (BID) | ORAL | 0 refills | Status: DC

## 2023-03-18 NOTE — Addendum Note (Signed)
Addended by: Catalina Antigua on: 03/18/2023 08:40 AM   Modules accepted: Orders

## 2023-05-07 ENCOUNTER — Ambulatory Visit: Admitting: Advanced Practice Midwife

## 2023-05-15 ENCOUNTER — Encounter (HOSPITAL_COMMUNITY): Payer: Self-pay

## 2023-05-15 ENCOUNTER — Emergency Department (HOSPITAL_COMMUNITY)
Admission: EM | Admit: 2023-05-15 | Discharge: 2023-05-15 | Disposition: A | Discharge status: Home / Self Care | Attending: Emergency Medicine | Admitting: Emergency Medicine

## 2023-05-15 ENCOUNTER — Other Ambulatory Visit: Payer: Self-pay

## 2023-05-15 DIAGNOSIS — N939 Abnormal uterine and vaginal bleeding, unspecified: Secondary | ICD-10-CM | POA: Insufficient documentation

## 2023-05-15 DIAGNOSIS — Z91040 Latex allergy status: Secondary | ICD-10-CM | POA: Diagnosis not present

## 2023-05-15 LAB — CBC WITH DIFFERENTIAL/PLATELET
Abs Immature Granulocytes: 0.02 10*3/uL (ref 0.00–0.07)
Basophils Absolute: 0 10*3/uL (ref 0.0–0.1)
Basophils Relative: 0 %
Eosinophils Absolute: 0.1 10*3/uL (ref 0.0–0.5)
Eosinophils Relative: 1 %
HCT: 31.8 % — ABNORMAL LOW (ref 36.0–46.0)
Hemoglobin: 10.5 g/dL — ABNORMAL LOW (ref 12.0–15.0)
Immature Granulocytes: 0 %
Lymphocytes Relative: 30 %
Lymphs Abs: 1.5 10*3/uL (ref 0.7–4.0)
MCH: 30.4 pg (ref 26.0–34.0)
MCHC: 33 g/dL (ref 30.0–36.0)
MCV: 92.2 fL (ref 80.0–100.0)
Monocytes Absolute: 0.4 10*3/uL (ref 0.1–1.0)
Monocytes Relative: 8 %
Neutro Abs: 3 10*3/uL (ref 1.7–7.7)
Neutrophils Relative %: 61 %
Platelets: 174 10*3/uL (ref 150–400)
RBC: 3.45 MIL/uL — ABNORMAL LOW (ref 3.87–5.11)
RDW: 12.3 % (ref 11.5–15.5)
WBC: 4.9 10*3/uL (ref 4.0–10.5)
nRBC: 0 % (ref 0.0–0.2)

## 2023-05-15 LAB — COMPREHENSIVE METABOLIC PANEL WITH GFR
ALT: 18 U/L (ref 0–44)
AST: 19 U/L (ref 15–41)
Albumin: 3.8 g/dL (ref 3.5–5.0)
Alkaline Phosphatase: 25 U/L — ABNORMAL LOW (ref 38–126)
Anion gap: 7 (ref 5–15)
BUN: 15 mg/dL (ref 6–20)
CO2: 23 mmol/L (ref 22–32)
Calcium: 8.9 mg/dL (ref 8.9–10.3)
Chloride: 106 mmol/L (ref 98–111)
Creatinine, Ser: 0.73 mg/dL (ref 0.44–1.00)
GFR, Estimated: >60 mL/min (ref >60)
Glucose, Bld: 120 mg/dL — ABNORMAL HIGH (ref 70–99)
Potassium: 4 mmol/L (ref 3.5–5.1)
Sodium: 136 mmol/L (ref 135–145)
Total Bilirubin: 0.4 mg/dL (ref 0.0–1.2)
Total Protein: 6.8 g/dL (ref 6.5–8.1)

## 2023-05-15 LAB — HCG, QUANTITATIVE, PREGNANCY: hCG, Beta Chain, Quant, S: <1 m[IU]/mL (ref <5)

## 2023-05-15 MED ORDER — MEGESTROL ACETATE 40 MG PO TABS: ORAL_TABLET | ORAL | 0 refills | Status: DC

## 2023-05-15 NOTE — Discharge Instructions (Signed)
 Follow-up with your OB/GYN doctor and 2 to 4 weeks

## 2023-05-15 NOTE — ED Provider Notes (Incomplete)
 [place] EMERGENCY DEPARTMENT AT [facility] Provider Note   CSN: [id number] Arrival date & time: 05/15/23  4098     History {Add pertinent medical, surgical, social history, OB history to HPI:1} Chief Complaint  Patient presents with   Vaginal Bleeding    Victoria Solis is a 33 y.o. female.  Patient states she has been having vaginal bleeding for weeks.  She is having no abdominal discomfort.  She is not on any type of hormones   Vaginal Bleeding      Home Medications Prior to Admission medications   Medication Sig Start Date End Date Taking? Authorizing Provider  megestrol (MEGACE) 40 MG tablet Take 1 pill 3 times a day for 3 days then 1 pill twice a day for 3 days then 1 pill once a day until you are finished with the bottle of 45 total 05/15/23  Yes Bethann Berkshire, MD  clindamycin (CLEOCIN) 300 MG capsule Take 1 capsule (300 mg total) by mouth 2 (two) times daily. For seven days 03/18/23   Constant, Peggy, MD  EPINEPHrine 0.3 mg/0.3 mL IJ SOAJ injection Inject 0.3 mg into the muscle as needed for anaphylaxis. Patient not taking: Reported on 08/29/2022 06/05/22   Dione Booze, MD      Allergies    Amoxicillin, Latex, Metronidazole, and Penicillins    Review of Systems   Review of Systems  Genitourinary:  Positive for vaginal bleeding.    Physical Exam Updated Vital Signs BP 116/79   Pulse (!) 51   Temp 98.1 F (36.7 C) (Oral)   Resp 18   Ht 5\' 4"  (1.626 m)   Wt 80.1 kg   LMP 04/13/2023   SpO2 99%   BMI 30.31 kg/m  Physical Exam  ED Results / Procedures / Treatments   Labs (all labs ordered are listed, but only abnormal results are displayed) Labs Reviewed  CBC WITH DIFFERENTIAL/PLATELET - Abnormal; Notable for the following components:      Result Value   RBC 3.45 (*)    Hemoglobin 10.5 (*)    HCT 31.8 (*)    All other components within normal limits  COMPREHENSIVE METABOLIC PANEL WITH GFR - Abnormal; Notable for the following  components:   Glucose, Bld 120 (*)    Alkaline Phosphatase 25 (*)    All other components within normal limits  HCG, QUANTITATIVE, PREGNANCY    EKG None  Radiology No results found.  Procedures Procedures  {Document cardiac monitor, telemetry assessment procedure when appropriate:1}  Medications Ordered in ED Medications - No data to display  ED Course/ Medical Decision Making/ A&P   {   Click here for ABCD2, HEART and other calculatorsREFRESH Note before signing :1}                              Medical Decision Making Amount and/or Complexity of Data Reviewed Labs: ordered.  Risk Prescription drug management.   Moderate vaginal bleeding for weeks.  Patient will be placed on Megace and follow-up with OB/GYN Dr.  {Document critical care time when appropriate:1} {Document review of labs and clinical decision tools ie heart score, Chads2Vasc2 etc:1}  {Document your independent review of radiology images, and any outside records:1} {Document your discussion with family members, caretakers, and with consultants:1} {Document social determinants of health affecting pt's care:1} {Document your decision making why or why not admission, treatments were needed:1} Final Clinical Impression(s) / ED Diagnoses Final diagnoses:  Vaginal bleeding    Rx / DC Orders ED Discharge Orders          Ordered    megestrol (MEGACE) 40 MG tablet        05/15/23 1049

## 2023-05-15 NOTE — ED Triage Notes (Signed)
 Pt arrived POV reporting vaginal bleeding since mentraul 04/13/2023.States heavy, dark red. Pt states she does not any abdominal pain. Denies weakness, shob, dizziness or any other symptoms.

## 2023-05-30 ENCOUNTER — Other Ambulatory Visit (HOSPITAL_COMMUNITY)
Admission: RE | Admit: 2023-05-30 | Discharge: 2023-05-30 | Disposition: A | Discharge status: Home / Self Care | Source: Ambulatory Visit | Attending: Obstetrics and Gynecology | Admitting: Obstetrics and Gynecology

## 2023-05-30 ENCOUNTER — Encounter: Payer: Self-pay | Admitting: Obstetrics and Gynecology

## 2023-05-30 ENCOUNTER — Ambulatory Visit: Admitting: Obstetrics and Gynecology

## 2023-05-30 VITALS — BP 107/71 | HR 48 | Ht 62.0 in | Wt 180.0 lb

## 2023-05-30 DIAGNOSIS — N939 Abnormal uterine and vaginal bleeding, unspecified: Secondary | ICD-10-CM | POA: Insufficient documentation

## 2023-05-30 NOTE — Progress Notes (Addendum)
 33 y.o. GYN presents for very heavy, longer lasting periods.  C/o bleeding since Feb.  Pt did not take the Megace  Rx, because she does not know how it would affect her body.

## 2023-05-30 NOTE — Progress Notes (Signed)
  CC: abnormal bleeding Subjective:    Patient ID: Victoria Solis, female    DOB: 12/25/1990, 33 y.o.   MRN: [id number]  HPI 33 yo G4P4, SVD x 2 and c/s x2 with BTL.  Pt seen as somewhat of an ER follow up for prolonged vaginal bleeding. At that time the patient had been bleeding for 2 months straight.  She was prescribed megace  to control the bleeding, but she did not take it.    Pt notes menarche at age 61.  She has never had regular menses.  Pt has skipped cycles in the past, sometimes as long as 5-6 months.  Pt has never had PCOS workup.  She has used OCPs in the past, but not since her teenage years.    Review of Systems     Objective:   Physical Exam Constitutional:      Appearance: Normal appearance. She is normal weight.  Neck:     Comments: Positive for mild acanthosis nigricans Genitourinary:    Comments: Normal vagina Cervix WNL Uterus slightly enlarged  Neurological:     Mental Status: She is alert.    Vitals:   05/30/23 0823  BP: 107/71  Pulse: (!) 48         Assessment & Plan:   1. Abnormal uterine bleeding (AUB) (Primary) Endometrial biopsy today If prolonged bleeding returns use megace  until labs and u/s are done to formulate plan Hormone panel today  If suspicious for PCOS, consider 10% weight loss reduction Follow up in 5 -6 weeks - Thyroid Panel With TSH - Follicle stimulating hormone - LH - Prolactin - US  PELVIC COMPLETE WITH TRANSVAGINAL; Future - Hemoglobin A1c - Insulin, Free and Total - Surgical pathology( [facility]/ [facility])    Abigail Abler, MD Faculty Attending, [facility]

## 2023-05-30 NOTE — Progress Notes (Signed)
 ENDOMETRIAL BIOPSY      Victoria Solis is a 33 y.o. [phone] here for endometrial biopsy.  The indications for endometrial biopsy were reviewed.  Risks of the biopsy including cramping, bleeding, infection, uterine perforation, inadequate specimen and need for additional procedures were discussed. The patient states she understands and agrees to undergo procedure today. Consent was signed. Time out was performed.   Indications: DUB Urine HCG: pt has BTL  A bivalve speculum was placed into the vagina and the cervix was easily visualized and was prepped with Betadine x2. A single-toothed tenaculum was placed on the anterior lip of the cervix to stabilize it. The 3 mm pipelle was introduced into the endometrial cavity without difficulty to a depth of 8 cm, and a moderate amount of tissue was obtained and sent to pathology. This was repeated for a total of 3 passes. The instruments were removed from the patient's vagina. Minimal bleeding from the cervix at the tenaculum was noted.   The patient tolerated the procedure well. Routine post-procedure instructions were given to the patient.    Will base further management on results of biopsy.  Avie Boeck, MD

## 2023-05-31 LAB — SURGICAL PATHOLOGY

## 2023-06-03 ENCOUNTER — Encounter: Payer: Self-pay | Admitting: Obstetrics and Gynecology

## 2023-06-08 LAB — PROLACTIN: Prolactin: 14.9 ng/mL (ref 4.8–33.4)

## 2023-06-08 LAB — THYROID PANEL WITH TSH
Free Thyroxine Index: 1.9 (ref 1.2–4.9)
T3 Uptake Ratio: 27 % (ref 24–39)
T4, Total: 7.1 ug/dL (ref 4.5–12.0)
TSH: 1.82 u[IU]/mL (ref 0.450–4.500)

## 2023-06-08 LAB — HEMOGLOBIN A1C
Est. average glucose Bld gHb Est-mCnc: 120 mg/dL
Hgb A1c MFr Bld: 5.8 % — ABNORMAL HIGH (ref 4.8–5.6)

## 2023-06-08 LAB — LUTEINIZING HORMONE: LH: 2.2 m[IU]/mL

## 2023-06-08 LAB — FOLLICLE STIMULATING HORMONE: FSH: 1.6 m[IU]/mL

## 2023-06-08 LAB — INSULIN, FREE AND TOTAL
Free Insulin: 7.9 uU/mL
Total Insulin: 7.9 uU/mL

## 2023-06-13 ENCOUNTER — Ambulatory Visit (HOSPITAL_COMMUNITY)
Admission: RE | Admit: 2023-06-13 | Discharge: 2023-06-13 | Disposition: A | Discharge status: Home / Self Care | Source: Ambulatory Visit | Attending: Obstetrics and Gynecology | Admitting: Obstetrics and Gynecology

## 2023-06-13 DIAGNOSIS — N939 Abnormal uterine and vaginal bleeding, unspecified: Secondary | ICD-10-CM | POA: Diagnosis present

## 2023-06-18 ENCOUNTER — Ambulatory Visit: Payer: Self-pay | Admitting: Obstetrics and Gynecology

## 2023-07-02 ENCOUNTER — Ambulatory Visit

## 2023-07-02 ENCOUNTER — Other Ambulatory Visit (HOSPITAL_COMMUNITY)
Admission: RE | Admit: 2023-07-02 | Discharge: 2023-07-02 | Disposition: A | Discharge status: Home / Self Care | Source: Ambulatory Visit | Attending: Obstetrics and Gynecology | Admitting: Obstetrics and Gynecology

## 2023-07-02 VITALS — BP 126/71 | Wt 177.0 lb

## 2023-07-02 DIAGNOSIS — Z113 Encounter for screening for infections with a predominantly sexual mode of transmission: Secondary | ICD-10-CM

## 2023-07-02 NOTE — Progress Notes (Signed)
 SUBJECTIVE:  33 y.o. female who desires a STI screen. Denies abnormal vaginal discharge, bleeding or significant pelvic pain. No UTI symptoms. Denies history of known exposure to STD.  No LMP recorded. (Menstrual status: Irregular Periods).  OBJECTIVE:  She appears well.   ASSESSMENT:  STI Screen   PLAN:  Pt offered STI blood screening-not indicated GC, chlamydia, and trichomonas probe sent to lab.  Treatment: To be determined once lab results are received.  Pt follow up as needed.

## 2023-07-03 ENCOUNTER — Ambulatory Visit: Payer: Self-pay | Admitting: Obstetrics and Gynecology

## 2023-07-03 LAB — CERVICOVAGINAL ANCILLARY ONLY
Bacterial Vaginitis (gardnerella): POSITIVE — AB
Candida Glabrata: NEGATIVE
Candida Vaginitis: NEGATIVE
Chlamydia: NEGATIVE
Comment: NEGATIVE
Comment: NEGATIVE
Comment: NEGATIVE
Comment: NEGATIVE
Comment: NEGATIVE
Comment: NORMAL
Neisseria Gonorrhea: NEGATIVE
Trichomonas: NEGATIVE

## 2023-07-04 ENCOUNTER — Other Ambulatory Visit: Payer: Self-pay

## 2023-07-04 MED ORDER — CLINDAMYCIN HCL 300 MG PO CAPS: 300.0000 mg | ORAL_CAPSULE | Freq: Two times a day (BID) | ORAL | 0 refills | Status: DC

## 2023-07-11 ENCOUNTER — Encounter: Payer: Self-pay | Admitting: Obstetrics and Gynecology

## 2023-07-11 ENCOUNTER — Ambulatory Visit: Admitting: Obstetrics and Gynecology

## 2023-07-11 VITALS — BP 98/65 | HR 58 | Ht 62.0 in | Wt 181.0 lb

## 2023-07-11 DIAGNOSIS — N939 Abnormal uterine and vaginal bleeding, unspecified: Secondary | ICD-10-CM

## 2023-07-11 MED ORDER — NORETHIN ACE-ETH ESTRAD-FE 1-20 MG-MCG(24) PO TABS: 1.0000 | ORAL_TABLET | Freq: Every day | ORAL | 3 refills | Status: DC

## 2023-07-11 NOTE — Progress Notes (Signed)
  CC: gyn follow up Subjective:    Patient ID: Victoria Solis, female    DOB: 06-29-1990, 33 y.o.   MRN: [id number]  HPI 33 yo G4P4 seen for discussion of abnormal uterine bleeding.  Endometrial biopsy was normal.  All labs normal other than slightly elevated A1c.  Ultrasound normal with only small right ovarian cyst noted.  Review of Systems     Objective:   Physical Exam Vitals:   07/11/23 0815  BP: 98/65  Pulse: (!) 58   CLINICAL DATA:  Abnormal uterine bleeding.   EXAM: TRANSABDOMINAL AND TRANSVAGINAL ULTRASOUND OF PELVIS   TECHNIQUE: Both transabdominal and transvaginal ultrasound examinations of the pelvis were performed. Transabdominal technique was performed for global imaging of the pelvis including uterus, ovaries, adnexal regions, and pelvic cul-de-sac. It was necessary to proceed with endovaginal exam following the transabdominal exam to visualize the uterus, endometrium, bilateral ovaries and bilateral adnexa.   COMPARISON:  None Available.   FINDINGS: Uterus   Measurements: 9.1 cm x 5.8 cm x 5.8 cm = volume: 160.2 mL. No fibroids or other mass visualized.   Endometrium   Thickness: 18.0 mm.  No focal abnormality visualized.   Right ovary   Measurements: 4.9 cm x 3.6 cm x 2.8 cm = volume: 26.2 mL. A 2.9 cm x 2.2 cm x 3.0 cm simple right ovarian cyst is noted.   Left ovary   Measurements: 2.1 cm x 1.8 cm x 1.3 cm = volume: 2.5 cm mL. Normal appearance/no adnexal mass.   Other findings   No abnormal free fluid.   IMPRESSION: 1. Large, simple right ovarian cyst. Correlation with 6-12 week follow-up pelvic ultrasound is recommended to determine stability. 2. Thickened endometrium which may be, in part, related to the phase of the patient's menstrual cycle.        Assessment & Plan:   1. Abnormal uterine bleeding (AUB) (Primary) Pt given multiple options for treatment expectant management 2. OCP 3. Progesterone IUD 4. Depo  provera  5. Uterine ablation 6. Hysterectomy  Pt desires trial of OCP, she does not smoke. Instructions given, 3 month trial of OCPs  Follow up in 4 months - Norethindrone  Acetate-Ethinyl Estrad-FE (LOESTRIN 24 FE) 1-20 MG-MCG(24) tablet; Take 1 tablet by mouth daily.  Dispense: 90 tablet; Refill: 3  I spent 20 minutes dedicated to the care of this patient including previsit review of records, face to face time with the patient discussing treatment options and post visit testing.   Abigail Abler, MD Faculty Attending, [facility]

## 2023-07-11 NOTE — Progress Notes (Signed)
 33 y.o. GYN presents for Follow up.  Reports no concerns today.

## 2023-07-19 ENCOUNTER — Encounter (HOSPITAL_COMMUNITY): Payer: Self-pay | Admitting: *Deleted

## 2023-07-19 ENCOUNTER — Ambulatory Visit (HOSPITAL_COMMUNITY)
Admission: EM | Admit: 2023-07-19 | Discharge: 2023-07-19 | Disposition: A | Discharge status: Home / Self Care | Attending: Emergency Medicine | Admitting: Emergency Medicine

## 2023-07-19 DIAGNOSIS — N898 Other specified noninflammatory disorders of vagina: Secondary | ICD-10-CM

## 2023-07-19 DIAGNOSIS — R300 Dysuria: Secondary | ICD-10-CM

## 2023-07-19 DIAGNOSIS — B3731 Acute candidiasis of vulva and vagina: Secondary | ICD-10-CM | POA: Diagnosis present

## 2023-07-19 DIAGNOSIS — N939 Abnormal uterine and vaginal bleeding, unspecified: Secondary | ICD-10-CM | POA: Diagnosis present

## 2023-07-19 LAB — POCT URINALYSIS DIP (MANUAL ENTRY)
Bilirubin, UA: NEGATIVE
Glucose, UA: NEGATIVE mg/dL
Ketones, POC UA: NEGATIVE mg/dL
Leukocytes, UA: NEGATIVE
Nitrite, UA: NEGATIVE
Spec Grav, UA: >=1.03 — AB (ref 1.010–1.025)
Urobilinogen, UA: 0.2 U/dL
pH, UA: 5.5 (ref 5.0–8.0)

## 2023-07-19 MED ORDER — FLUCONAZOLE 150 MG PO TABS: ORAL_TABLET | ORAL | 0 refills | Status: DC

## 2023-07-19 NOTE — Discharge Instructions (Signed)
 Take 1 tablet of Diflucan  today and another tablet in 3 days if your itching and irritation continue. Your swab results and urine culture will return over the next few days and someone will call and adjust treatment if needed. Otherwise I recommend following up with your OB/GYN for further evaluation of your continued vaginal bleeding and vaginal issues. Return here as needed.

## 2023-07-19 NOTE — ED Provider Notes (Signed)
 [facility]    CSN: [id number] Arrival date & time: 07/19/23  1356      History   Chief Complaint Chief Complaint  Patient presents with   Urinary Frequency    HPI Victoria Solis is a 33 y.o. female.   Patient presents with concerns for possible blood in her urine.  Patient states that she has had some abnormal vaginal bleeding, but now it seems like she may have blood in her urine as well.    Patient was recently seen by OB/GYN on 6/12 for abnormal uterine bleeding.  Patient endorses some mild dysuria that she describes as irritation or burning to the vagina upon urinating.  Patient states that she also has some vaginal itching.    Patient denies any significant abnormal vaginal discharge.  Denies abdominal pain, flank pain, fever, nausea, vomiting, and urinary frequency/urgency.  Patient has history of bilateral tubal ligation.  Patient reports LMP began on 07/04/2023.  The history is provided by the patient and medical records.  Urinary Frequency    Past Medical History:  Diagnosis Date   Migraine     Patient Active Problem List   Diagnosis Date Noted   Trichomonal vaginitis 07/21/2019   Hx of chlamydia infection 10/27/2018   Status post tubal ligation 10/27/2018   Abnormal uterine bleeding (AUB) 10/27/2018    Past Surgical History:  Procedure Laterality Date   CESAREAN SECTION     CESAREAN SECTION N/A 09/19/2018   Procedure: CESAREAN SECTION;  Surgeon: Verlyn Goad, MD;  Location: [facility];  Service: Obstetrics;  Laterality: N/A;   CESAREAN SECTION WITH BILATERAL TUBAL LIGATION Bilateral 09/19/2018   Procedure: CESAREAN SECTION WITH BILATERAL TUBAL LIGATION;  Surgeon: Verlyn Goad, MD;  Location: [facility];  Service: Obstetrics;  Laterality: Bilateral;    OB History     Gravida  4   Para  3   Term  3   Preterm      AB      Living  3      SAB      IAB      Ectopic      Multiple  0   Live Births  3             Home Medications    Prior to Admission medications   Medication Sig Start Date End Date Taking? Authorizing Provider  fluconazole  (DIFLUCAN ) 150 MG tablet Take one tablet today and one tablet in 3 days if symptoms persist. 07/19/23  Yes Levora Reas A, NP  Norethindrone  Acetate-Ethinyl Estrad-FE (LOESTRIN 24 FE) 1-20 MG-MCG(24) tablet Take 1 tablet by mouth daily. 07/11/23   Abigail Abler, MD    Family History Family History  Problem Relation Age of Onset   Hypertension Mother    Diabetes Mother    Stroke Mother    Kidney failure Mother    Hypertension Father    Diabetes Father     Social History Social History   Tobacco Use   Smoking status: Never   Smokeless tobacco: Never  Vaping Use   Vaping status: Never Used  Substance Use Topics   Alcohol use: Not Currently   Drug use: No     Allergies   Amoxicillin, Latex, Metronidazole , and Penicillins   Review of Systems Review of Systems  Genitourinary:  Positive for dysuria and vaginal bleeding.   Per HPI  Physical Exam Triage Vital Signs ED Triage Vitals  Encounter Vitals Group     BP 07/19/23  1424 103/61     Girls Systolic BP Percentile --      Girls Diastolic BP Percentile --      Boys Systolic BP Percentile --      Boys Diastolic BP Percentile --      Pulse Rate 07/19/23 1409 (!) 2     Resp 07/19/23 1409 18     Temp 07/19/23 1409 98.2 F (36.8 C)     Temp Source 07/19/23 1409 Oral     SpO2 07/19/23 1409 99 %     Weight --      Height --      Head Circumference --      Peak Flow --      Pain Score --      Pain Loc --      Pain Education --      Exclude from Growth Chart --    No data found.  Updated Vital Signs BP 103/61 (BP Location: Right Arm)   Pulse 60   Temp 98.2 F (36.8 C) (Oral)   Resp 18   LMP 07/04/2023 (Approximate)   SpO2 99%   Visual Acuity Right Eye Distance:   Left Eye Distance:   Bilateral Distance:    Right Eye Near:   Left Eye Near:    Bilateral Near:      Physical Exam Vitals and nursing note reviewed.  Constitutional:      General: She is awake. She is not in acute distress.    Appearance: Normal appearance. She is well-developed and well-groomed. She is not ill-appearing.  Abdominal:     General: Abdomen is flat.     Palpations: Abdomen is soft.     Tenderness: There is no abdominal tenderness. There is no right CVA tenderness or left CVA tenderness.  Genitourinary:    Comments: Exam deferred  Skin:    General: Skin is warm and dry.   Neurological:     Mental Status: She is alert.   Psychiatric:        Behavior: Behavior is cooperative.      UC Treatments / Results  Labs (all labs ordered are listed, but only abnormal results are displayed) Labs Reviewed  POCT URINALYSIS DIP (MANUAL ENTRY) - Abnormal; Notable for the following components:      Result Value   Spec Grav, UA >=1.030 (*)    Blood, UA large (*)    Protein Ur, POC trace (*)    All other components within normal limits  URINE CULTURE  CERVICOVAGINAL ANCILLARY ONLY    EKG   Radiology No results found.  Procedures Procedures (including critical care time)  Medications Ordered in UC Medications - No data to display  Initial Impression / Assessment and Plan / UC Course  I have reviewed the triage vital signs and the nursing notes.  Pertinent labs & imaging results that were available during my care of the patient were reviewed by me and considered in my medical decision making (see chart for details).     Patient is overall well-appearing.  Vitals are stable.  Nontender upon palpation to abdomen.  No CVA tenderness noted.  GU exam deferred.  Patient performed self swab.  Declined HIV and RPR.  Urinalysis did not reveal any signs of infection but did show large amounts of RBCs.  Will send urine culture to confirm.  RBC presence likely related to vaginal bleeding.  Empirically treating with Diflucan  for yeast vaginitis due to vaginal itching.   Recommended following up with  OB/GYN regarding recurrent issues with vaginal bleeding.  Discussed follow-up and return precautions. Final Clinical Impressions(s) / UC Diagnoses   Final diagnoses:  Vaginal itching  Dysuria  Yeast vaginitis  Vaginal bleeding     Discharge Instructions      Take 1 tablet of Diflucan  today and another tablet in 3 days if your itching and irritation continue. Your swab results and urine culture will return over the next few days and someone will call and adjust treatment if needed. Otherwise I recommend following up with your OB/GYN for further evaluation of your continued vaginal bleeding and vaginal issues. Return here as needed.    ED Prescriptions     Medication Sig Dispense Auth. Provider   fluconazole  (DIFLUCAN ) 150 MG tablet Take one tablet today and one tablet in 3 days if symptoms persist. 2 tablet Levora Reas A, NP      PDMP not reviewed this encounter.   Levora Reas A, NP 07/19/23 [phone]

## 2023-07-19 NOTE — ED Triage Notes (Signed)
 Pt states she urinates a lot and there is blood on tissue when she wipes , states blood is from peeing.  Denies pain except when she wipes it is irritated, states it's not an STD when asked, doesn't use any soaps in her personal area.

## 2023-07-20 LAB — URINE CULTURE: Culture: <10000 — AB

## 2023-07-22 ENCOUNTER — Ambulatory Visit (HOSPITAL_COMMUNITY): Payer: Self-pay

## 2023-07-22 ENCOUNTER — Ambulatory Visit

## 2023-07-22 LAB — CERVICOVAGINAL ANCILLARY ONLY
Bacterial Vaginitis (gardnerella): POSITIVE — AB
Candida Glabrata: NEGATIVE
Candida Vaginitis: NEGATIVE
Chlamydia: NEGATIVE
Comment: NEGATIVE
Comment: NEGATIVE
Comment: NEGATIVE
Comment: NEGATIVE
Comment: NEGATIVE
Comment: NORMAL
Neisseria Gonorrhea: NEGATIVE
Trichomonas: NEGATIVE

## 2023-07-22 MED ORDER — CLINDAMYCIN HCL 150 MG PO CAPS: 300.0000 mg | ORAL_CAPSULE | Freq: Two times a day (BID) | ORAL | 0 refills | Status: AC

## 2023-07-29 ENCOUNTER — Encounter (HOSPITAL_COMMUNITY): Payer: Self-pay | Admitting: Emergency Medicine

## 2023-07-29 ENCOUNTER — Other Ambulatory Visit: Payer: Self-pay

## 2023-07-29 ENCOUNTER — Emergency Department (HOSPITAL_COMMUNITY)
Admission: EM | Admit: 2023-07-29 | Discharge: 2023-07-29 | Disposition: A | Discharge status: Home / Self Care | Attending: Emergency Medicine | Admitting: Emergency Medicine

## 2023-07-29 DIAGNOSIS — N939 Abnormal uterine and vaginal bleeding, unspecified: Secondary | ICD-10-CM | POA: Diagnosis present

## 2023-07-29 LAB — CBC
HCT: 29.1 % — ABNORMAL LOW (ref 36.0–46.0)
Hemoglobin: 9.6 g/dL — ABNORMAL LOW (ref 12.0–15.0)
MCH: 30.6 pg (ref 26.0–34.0)
MCHC: 33 g/dL (ref 30.0–36.0)
MCV: 92.7 fL (ref 80.0–100.0)
Platelets: 198 10*3/uL (ref 150–400)
RBC: 3.14 MIL/uL — ABNORMAL LOW (ref 3.87–5.11)
RDW: 12.6 % (ref 11.5–15.5)
WBC: 4.6 10*3/uL (ref 4.0–10.5)
nRBC: 0 % (ref 0.0–0.2)

## 2023-07-29 LAB — HCG, SERUM, QUALITATIVE: Preg, Serum: NEGATIVE

## 2023-07-29 NOTE — ED Triage Notes (Signed)
 Pt presents reporting heavy vaginal bleeding.  States she had been told she had a cyst at one point.  Bleeding has become more frequent and more heavy.  Pt states she feels tired but not dizzy or shob.   Has some abdominal cramping.

## 2023-07-29 NOTE — ED Provider Notes (Signed)
 [place] EMERGENCY DEPARTMENT AT [facility] Provider Note   CSN: [id number] Arrival date & time: 07/29/23  2047     History Chief Complaint  Patient presents with   Vaginal Bleeding    HPI Victoria Solis is a 33 y.o. female presenting for chief complaint of months of VB. States that she is having intermittent VB. Already follows with OB Had US . Was told to be on [facility]. Stopped taking it.   Patient's recorded medical, surgical, social, medication list and allergies were reviewed in the Snapshot window as part of the initial history.   Review of Systems   Review of Systems  Constitutional:  Negative for chills and fever.  HENT:  Negative for ear pain and sore throat.   Eyes:  Negative for pain and visual disturbance.  Respiratory:  Negative for cough and shortness of breath.   Cardiovascular:  Negative for chest pain and palpitations.  Gastrointestinal:  Negative for abdominal pain and vomiting.  Genitourinary:  Positive for vaginal bleeding. Negative for dysuria and hematuria.  Musculoskeletal:  Negative for arthralgias and back pain.  Skin:  Negative for color change and rash.  Neurological:  Negative for seizures and syncope.  All other systems reviewed and are negative.   Physical Exam Updated Vital Signs BP 137/77 (BP Location: Left Arm)   Pulse (!) 57   Temp 98 F (36.7 C) (Oral)   Resp 18   LMP 07/04/2023 (Approximate)   SpO2 98%  Physical Exam Vitals and nursing note reviewed.  Constitutional:      General: She is not in acute distress.    Appearance: She is well-developed.  HENT:     Head: Normocephalic and atraumatic.   Eyes:     Conjunctiva/sclera: Conjunctivae normal.    Cardiovascular:     Rate and Rhythm: Normal rate and regular rhythm.     Heart sounds: No murmur heard. Pulmonary:     Effort: Pulmonary effort is normal. No respiratory distress.     Breath sounds: Normal breath sounds.  Abdominal:     General: There is  no distension.     Palpations: Abdomen is soft.     Tenderness: There is no abdominal tenderness. There is no right CVA tenderness or left CVA tenderness.  Genitourinary:    Comments: Deferred to  OB tomorrow.  Musculoskeletal:        General: No swelling or tenderness. Normal range of motion.     Cervical back: Neck supple.   Skin:    General: Skin is warm and dry.   Neurological:     General: No focal deficit present.     Mental Status: She is alert and oriented to person, place, and time. Mental status is at baseline.     Cranial Nerves: No cranial nerve deficit.      ED Course/ Medical Decision Making/ A&P    Procedures Procedures   Medications Ordered in ED Medications - No data to display  Medical Decision Making:   [name] is a 33 y.o. female who presented to the ED today with vaginal bleeding detailed above.    Patient placed on continuous vitals and telemetry monitoring while in ED which was reviewed periodically.  Complete initial physical exam performed, notably the patient was HDS in NAD.    Reviewed and confirmed nursing documentation for past medical history, family history, social history.    Initial Assessment:   With the patient's presentation of vaginal bleeding, most likely diagnosis is dysfunctional  uterine bleeding. Other diagnoses were considered including (but not limited to) early PCOS, result of medication changes/pregancy. These are considered less likely due to history of present illness and physical exam findings.   This is most consistent with an acute complicated illness  Initial Plan:  Screening labs including CBC and Metabolic panel to evaluate for infectious or metabolic etiology of disease.   Initial Study Results:   Laboratory  All laboratory results reviewed without evidence of clinically relevant pathology.    Reassessment: Bleeding ceased. She has follow up with OBGYN tomorrow. HGB ok.   Disposition:  I have  considered need for hospitalization, however, considering all of the above, I believe this patient is stable for discharge at this time.  Patient/family educated about specific return precautions for given chief complaint and symptoms.  Patient/family educated about follow-up with PCP.     Patient/family expressed understanding of return precautions and need for follow-up. Patient spoken to regarding all imaging and laboratory results and appropriate follow up for these results. All education provided in verbal form with additional information in written form. Time was allowed for answering of patient questions. Patient discharged.    Emergency Department Medication Summary:   Medications - No data to display       Clinical Impression:  1. Vaginal bleeding      Discharge   Final Clinical Impression(s) / ED Diagnoses Final diagnoses:  Vaginal bleeding    Rx / DC Orders ED Discharge Orders     None         Jerral Meth, MD 07/29/23 2318

## 2023-07-29 NOTE — ED Notes (Signed)
Patient verbalizes understanding of discharge instructions. Opportunity for questioning and answers were provided. Armband removed by staff, pt discharged from ED. Ambulated out to lobby  

## 2023-07-30 ENCOUNTER — Encounter: Payer: Self-pay | Admitting: Obstetrics and Gynecology

## 2023-07-30 ENCOUNTER — Ambulatory Visit (INDEPENDENT_AMBULATORY_CARE_PROVIDER_SITE_OTHER): Admitting: Obstetrics and Gynecology

## 2023-07-30 VITALS — BP 105/69 | HR 57 | Ht 63.0 in | Wt 175.8 lb

## 2023-07-30 DIAGNOSIS — N939 Abnormal uterine and vaginal bleeding, unspecified: Secondary | ICD-10-CM | POA: Diagnosis not present

## 2023-07-30 DIAGNOSIS — Z30013 Encounter for initial prescription of injectable contraceptive: Secondary | ICD-10-CM | POA: Diagnosis not present

## 2023-07-30 MED ORDER — MEDROXYPROGESTERONE ACETATE 150 MG/ML IM SUSP
150.0000 mg | Freq: Once | INTRAMUSCULAR | Status: AC
  Administered 2023-07-30: 150 mg via INTRAMUSCULAR

## 2023-07-30 MED ORDER — MEDROXYPROGESTERONE ACETATE 150 MG/ML IM SUSP: 150.0000 mg | INTRAMUSCULAR | 3 refills | Status: DC

## 2023-07-30 NOTE — Progress Notes (Signed)
 Pt c/o heavy bleeding. Pt has been seen in office for this. She was taking BC pill for this for 2 weeks but recently stopped them while taking Flagyl  but states bleeding never stopped from last visit.

## 2023-07-30 NOTE — Progress Notes (Signed)
   GYNECOLOGY OFFICE NOTE  History:  33 y.o. [id number] here today for heavy bleeding. Started OCPs for heavy bleeding but only took two weeks of them, then stopped because she was taking flagyl  for BV. She is still bleeding.  She was on depo prior to last pregnancy, then had pregnancy and got her tubes tied. Her cycles stopped completely with the depo provera .   Past Medical History:  Diagnosis Date   Migraine     Past Surgical History:  Procedure Laterality Date   CESAREAN SECTION     CESAREAN SECTION N/A 09/19/2018   Procedure: CESAREAN SECTION;  Surgeon: Alger Gong, MD;  Location: [facility];  Service: Obstetrics;  Laterality: N/A;   CESAREAN SECTION WITH BILATERAL TUBAL LIGATION Bilateral 09/19/2018   Procedure: CESAREAN SECTION WITH BILATERAL TUBAL LIGATION;  Surgeon: Alger Gong, MD;  Location: [facility];  Service: Obstetrics;  Laterality: Bilateral;    Current Outpatient Medications:    medroxyPROGESTERone  (DEPO-PROVERA ) 150 MG/ML injection, Inject 1 mL (150 mg total) into the muscle every 3 (three) months., Disp: 1 mL, Rfl: 3   fluconazole  (DIFLUCAN ) 150 MG tablet, Take one tablet today and one tablet in 3 days if symptoms persist. (Patient not taking: Reported on 07/30/2023), Disp: 2 tablet, Rfl: 0  The following portions of the patient's history were reviewed and updated as appropriate: allergies, current medications, past family history, past medical history, past social history, past surgical history and problem list.   Review of Systems:  Pertinent items noted in HPI and remainder of comprehensive ROS otherwise negative.   Objective:  Physical Exam BP 105/69   Pulse (!) 57   Ht 5' 3 (1.6 m)   Wt 175 lb 12.8 oz (79.7 kg)   LMP 07/28/2023 (Approximate)   BMI 31.14 kg/m  CONSTITUTIONAL: Well-developed, well-nourished female in no acute distress.  HENT:  Normocephalic, atraumatic. External right and left ear normal. Oropharynx is clear and moist EYES: Conjunctivae  and EOM are normal. Pupils are equal, round, and reactive to light. No scleral icterus.  NECK: Normal range of motion, supple, no masses SKIN: Skin is warm and dry. No rash noted. Not diaphoretic. No erythema. No pallor. NEUROLOGIC: Alert and oriented to person, place, and time. Normal reflexes, muscle tone coordination. No cranial nerve deficit noted. PSYCHIATRIC: Normal mood and affect. Normal behavior. Normal judgment and thought content. CARDIOVASCULAR: Normal heart rate noted RESPIRATORY: Effort normal, no problems with respiration noted ABDOMEN: Soft, no distention noted.   PELVIC: deferred MUSCULOSKELETAL: Normal range of motion. No edema noted.  Labs and Imaging No results found.  Assessment & Plan:  1. Abnormal uterine bleeding (AUB) (Primary) - heavy bleeding, does not want to keep taking OCPs - has been on depo before with good effect - she would like to go back on depo, reviewed risks/benefits, she would like to proceed - s/p BTL and had negative HCG yesterday   Routine preventative health maintenance measures emphasized. Please refer to After Visit Summary for other counseling recommendations.   Return in about 3 months (around 10/30/2023) for depo.   LOIS Yolanda Moats, MD, [facility] Attending Center for [facility])

## 2023-08-21 ENCOUNTER — Ambulatory Visit

## 2023-08-26 ENCOUNTER — Ambulatory Visit

## 2023-08-27 ENCOUNTER — Ambulatory Visit

## 2023-09-11 ENCOUNTER — Other Ambulatory Visit (HOSPITAL_COMMUNITY)
Admission: RE | Admit: 2023-09-11 | Discharge: 2023-09-11 | Disposition: A | Discharge status: Home / Self Care | Source: Ambulatory Visit | Attending: Obstetrics and Gynecology | Admitting: Obstetrics and Gynecology

## 2023-09-11 ENCOUNTER — Ambulatory Visit

## 2023-09-11 VITALS — BP 116/73 | HR 53 | Wt 180.2 lb

## 2023-09-11 DIAGNOSIS — N898 Other specified noninflammatory disorders of vagina: Secondary | ICD-10-CM | POA: Insufficient documentation

## 2023-09-11 NOTE — Progress Notes (Signed)
 SUBJECTIVE:  33 y.o. female complains of dark vaginal discharge for 2 week(s). Denies abnormal vaginal bleeding or significant pelvic pain or fever. No UTI symptoms. Denies history of known exposure to STD.  No LMP recorded. (Menstrual status: Bilateral Tubal Ligation).  OBJECTIVE:  She appears well, afebrile. Urine dipstick: not done.  ASSESSMENT:  Vaginal Discharge  Denies Vaginal Odor Pt concerned for BV   PLAN:  GC, chlamydia, trichomonas, BVAG, CVAG probe sent to lab. Treatment: To be determined once lab results are received ROV prn if symptoms persist or worsen.

## 2023-09-12 LAB — CERVICOVAGINAL ANCILLARY ONLY
Bacterial Vaginitis (gardnerella): NEGATIVE
Candida Glabrata: NEGATIVE
Candida Vaginitis: NEGATIVE
Chlamydia: NEGATIVE
Comment: NEGATIVE
Comment: NEGATIVE
Comment: NEGATIVE
Comment: NEGATIVE
Comment: NEGATIVE
Comment: NORMAL
Neisseria Gonorrhea: NEGATIVE
Trichomonas: NEGATIVE

## 2023-09-17 ENCOUNTER — Ambulatory Visit: Payer: Self-pay | Admitting: Obstetrics and Gynecology

## 2023-10-22 ENCOUNTER — Ambulatory Visit

## 2023-10-24 ENCOUNTER — Ambulatory Visit

## 2023-10-28 ENCOUNTER — Encounter

## 2023-10-28 MED ORDER — MEDROXYPROGESTERONE ACETATE 150 MG/ML IM SUSP: 150.0000 mg | INTRAMUSCULAR | 3 refills | Status: AC

## 2023-10-28 NOTE — Progress Notes (Unsigned)
 Error---pt has to get depo from pharmacy

## 2023-10-29 ENCOUNTER — Ambulatory Visit (INDEPENDENT_AMBULATORY_CARE_PROVIDER_SITE_OTHER)

## 2023-10-29 DIAGNOSIS — Z3042 Encounter for surveillance of injectable contraceptive: Secondary | ICD-10-CM

## 2023-10-29 MED ORDER — MEDROXYPROGESTERONE ACETATE 150 MG/ML IM SUSP
150.0000 mg | Freq: Once | INTRAMUSCULAR | Status: AC
  Administered 2023-10-29: 150 mg via INTRAMUSCULAR

## 2023-10-29 NOTE — Progress Notes (Signed)
 Date last pap: 05/30/23. Last Depo-Provera : 07/30/23. Side Effects if any: NA. Serum HCG indicated? NA. Depo-Provera  150 mg IM given by: Duwaine Galla, RN in L Deltoid. Next appointment due Dec 16-30 2025.

## 2023-11-05 ENCOUNTER — Other Ambulatory Visit: Payer: Self-pay

## 2023-11-05 ENCOUNTER — Encounter (HOSPITAL_COMMUNITY): Payer: Self-pay | Admitting: Emergency Medicine

## 2023-11-05 ENCOUNTER — Ambulatory Visit (HOSPITAL_COMMUNITY)
Admission: EM | Admit: 2023-11-05 | Discharge: 2023-11-05 | Disposition: A | Discharge status: Home / Self Care | Attending: Family Medicine | Admitting: Family Medicine

## 2023-11-05 DIAGNOSIS — H1031 Unspecified acute conjunctivitis, right eye: Secondary | ICD-10-CM

## 2023-11-05 MED ORDER — GENTAMICIN SULFATE 0.3 % OP SOLN
2.0000 [drp] | Freq: Three times a day (TID) | OPHTHALMIC | 0 refills | Status: AC
Start: 2023-11-05 — End: 2023-11-10

## 2023-11-05 NOTE — Discharge Instructions (Addendum)
 Put gentamicin eyedrops in the affected eye(s) 3 times daily for 5 days.  Take your contacts out when you get home and do not wear contacts until you are done with antibiotic eyedrops.  Then use a new contact for your right eye especially.  Please contact your eye care provider for follow-up of this issue, and especially if this is not improving rapidly.  If you are worsening in anyway, go to the ER

## 2023-11-05 NOTE — ED Provider Notes (Signed)
 [facility]    CSN: [id number] Arrival date & time: 11/05/23  9163      History   Chief Complaint Chief Complaint  Patient presents with   Eye Problem    HPI Victoria Solis is a 33 y.o. female.    Eye Problem Here for redness and irritation of right eye, began yesterday. Some dc. No URI symptoms.  She does wear contacts, usually doesn't take them out at night.  She is allergic to PCN and metronidazole .   Past Medical History:  Diagnosis Date   Migraine     Patient Active Problem List   Diagnosis Date Noted   Trichomonal vaginitis 07/21/2019   Hx of chlamydia infection 10/27/2018   Status post tubal ligation 10/27/2018   Abnormal uterine bleeding (AUB) 10/27/2018    Past Surgical History:  Procedure Laterality Date   CESAREAN SECTION     CESAREAN SECTION N/A 09/19/2018   Procedure: CESAREAN SECTION;  Surgeon: Alger Gong, MD;  Location: [facility];  Service: Obstetrics;  Laterality: N/A;   CESAREAN SECTION WITH BILATERAL TUBAL LIGATION Bilateral 09/19/2018   Procedure: CESAREAN SECTION WITH BILATERAL TUBAL LIGATION;  Surgeon: Alger Gong, MD;  Location: [facility];  Service: Obstetrics;  Laterality: Bilateral;    OB History     Gravida  4   Para  3   Term  3   Preterm      AB      Living  4      SAB      IAB      Ectopic      Multiple  0   Live Births  4            Home Medications    Prior to Admission medications   Medication Sig Start Date End Date Taking? Authorizing Provider  gentamicin (GARAMYCIN) 0.3 % ophthalmic solution Place 2 drops into the right eye 3 (three) times daily for 5 days. 11/05/23 11/10/23 Yes Vonna Sharlet POUR, MD  medroxyPROGESTERone  (DEPO-PROVERA ) 150 MG/ML injection Inject 1 mL (150 mg total) into the muscle every 3 (three) months. 10/28/23   Constant, Gong, MD    Family History Family History  Problem Relation Age of Onset   Hypertension Mother    Diabetes Mother    Stroke  Mother    Kidney failure Mother    Hypertension Father    Diabetes Father     Social History Social History   Tobacco Use   Smoking status: Never   Smokeless tobacco: Never  Vaping Use   Vaping status: Never Used  Substance Use Topics   Alcohol use: Not Currently   Drug use: No     Allergies   Amoxicillin, Latex, Metronidazole , and Penicillins   Review of Systems Review of Systems   Physical Exam Triage Vital Signs ED Triage Vitals  Encounter Vitals Group     BP 11/05/23 0919 115/67     Girls Systolic BP Percentile --      Girls Diastolic BP Percentile --      Boys Systolic BP Percentile --      Boys Diastolic BP Percentile --      Pulse Rate 11/05/23 0919 (!) 58     Resp 11/05/23 0919 18     Temp 11/05/23 0919 98.3 F (36.8 C)     Temp Source 11/05/23 0919 Oral     SpO2 11/05/23 0919 97 %     Weight --  Height --      Head Circumference --      Peak Flow --      Pain Score 11/05/23 0917 8     Pain Loc --      Pain Education --      Exclude from Growth Chart --    No data found.  Updated Vital Signs BP 115/67 (BP Location: Left Arm)   Pulse (!) 58   Temp 98.3 F (36.8 C) (Oral)   Resp 18   SpO2 97%   Visual Acuity Right Eye Distance: 20/25 Left Eye Distance: 20/25 Bilateral Distance: 20/25 (with contacts)  Right Eye Near:   Left Eye Near:    Bilateral Near:     Physical Exam Vitals reviewed.  Constitutional:      General: She is not in acute distress.    Appearance: She is not ill-appearing, toxic-appearing or diaphoretic.  HENT:     Mouth/Throat:     Mouth: Mucous membranes are moist.  Eyes:     Extraocular Movements: Extraocular movements intact.     Pupils: Pupils are equal, round, and reactive to light.     Comments: There is injection of the right conjunctiva.  No swelling of the eyelids.  Skin:    Coloration: Skin is not pale.  Neurological:     Mental Status: She is alert and oriented to person, place, and time.   Psychiatric:        Behavior: Behavior normal.      UC Treatments / Results  Labs (all labs ordered are listed, but only abnormal results are displayed) Labs Reviewed - No data to display  EKG   Radiology No results found.  Procedures Procedures (including critical care time)  Medications Ordered in UC Medications - No data to display  Initial Impression / Assessment and Plan / UC Course  I have reviewed the triage vital signs and the nursing notes.  Pertinent labs & imaging results that were available during my care of the patient were reviewed by me and considered in my medical decision making (see chart for details).     Gentamicin is sent and what appears to be right conjunctivitis.  She will take her contacts out and not wear contacts until she is done with treatment.  She will follow-up with her eye care provider.  Final Clinical Impressions(s) / UC Diagnoses   Final diagnoses:  Acute conjunctivitis of right eye, unspecified acute conjunctivitis type     Discharge Instructions      Put gentamicin eyedrops in the affected eye(s) 3 times daily for 5 days.  Take your contacts out when you get home and do not wear contacts until you are done with antibiotic eyedrops.  Then use a new contact for your right eye especially.  Please contact your eye care provider for follow-up of this issue, and especially if this is not improving rapidly.  If you are worsening in anyway, go to the ER     ED Prescriptions     Medication Sig Dispense Auth. Provider   gentamicin (GARAMYCIN) 0.3 % ophthalmic solution Place 2 drops into the right eye 3 (three) times daily for 5 days. 5 mL Vonna Sharlet POUR, MD      PDMP not reviewed this encounter.   Vonna Sharlet POUR, MD 11/05/23 [phone]

## 2023-11-05 NOTE — ED Notes (Signed)
 Provided work note

## 2023-11-05 NOTE — ED Triage Notes (Signed)
 Right eye was watery and itching yesterday.  Since then, light sensitivity, red.  Patient says she can see, but eye hurts  Patient currently has contacts in this eye

## 2023-11-25 ENCOUNTER — Ambulatory Visit

## 2023-11-25 ENCOUNTER — Other Ambulatory Visit (HOSPITAL_COMMUNITY)
Admission: RE | Admit: 2023-11-25 | Discharge: 2023-11-25 | Disposition: A | Discharge status: Home / Self Care | Source: Ambulatory Visit | Attending: Obstetrics and Gynecology | Admitting: Obstetrics and Gynecology

## 2023-11-25 VITALS — BP 132/77 | HR 79

## 2023-11-25 DIAGNOSIS — Z113 Encounter for screening for infections with a predominantly sexual mode of transmission: Secondary | ICD-10-CM

## 2023-11-25 NOTE — Progress Notes (Signed)
 SUBJECTIVE:  33 y.o. female who desires a STI screen. Denies abnormal vaginal discharge, bleeding or significant pelvic pain. No UTI symptoms. Denies history of known exposure to STD.  No LMP recorded. (Menstrual status: Bilateral Tubal Ligation).  OBJECTIVE:  She appears well.   ASSESSMENT:  STI Screen   PLAN:  Pt offered STI blood screening-not indicated GC, chlamydia, and trichomonas probe sent to lab.  Treatment: To be determined once lab results are received.  Pt follow up as needed.

## 2023-11-26 LAB — CERVICOVAGINAL ANCILLARY ONLY
Bacterial Vaginitis (gardnerella): NEGATIVE
Candida Glabrata: NEGATIVE
Candida Vaginitis: NEGATIVE
Chlamydia: NEGATIVE
Comment: NEGATIVE
Comment: NEGATIVE
Comment: NEGATIVE
Comment: NEGATIVE
Comment: NEGATIVE
Comment: NORMAL
Neisseria Gonorrhea: NEGATIVE
Trichomonas: NEGATIVE

## 2024-01-20 ENCOUNTER — Ambulatory Visit: Payer: MEDICAID

## 2024-01-20 VITALS — BP 116/70 | HR 73 | Wt 181.3 lb

## 2024-01-20 DIAGNOSIS — Z3042 Encounter for surveillance of injectable contraceptive: Secondary | ICD-10-CM | POA: Diagnosis not present

## 2024-01-20 MED ADMIN — Medroxyprogesterone Acetate IM Susp 150 MG/ML: 150 mg | INTRAMUSCULAR | NDC 70121146702

## 2024-01-20 NOTE — Progress Notes (Signed)
..  Date last pap: 03/09/22 . Last Depo-Provera : 10/29/23. Side Effects if any: N/a. Serum HCG indicated? N/a. Depo-Provera  150 mg IM given in R Del Next appointment due March 9- March 23. .. Administrations This Visit     medroxyPROGESTERone  (DEPO-PROVERA ) injection 150 mg     Admin Date 01/20/2024 Action Given Dose 150 mg Route Intramuscular Documented By Doneta Laymon BIRCH, RN

## 2024-04-06 ENCOUNTER — Ambulatory Visit: Payer: Self-pay
